# Patient Record
Sex: Female | Born: 1945
Health system: Southern US, Community
[De-identification: ages and names within clinical notes are randomized; demographics above are authoritative.]

## PROBLEM LIST (undated history)

## (undated) DIAGNOSIS — Z9049 Acquired absence of other specified parts of digestive tract: Secondary | ICD-10-CM

## (undated) DIAGNOSIS — R011 Cardiac murmur, unspecified: Secondary | ICD-10-CM

## (undated) DIAGNOSIS — E785 Hyperlipidemia, unspecified: Secondary | ICD-10-CM

## (undated) DIAGNOSIS — N281 Cyst of kidney, acquired: Secondary | ICD-10-CM

## (undated) DIAGNOSIS — I509 Heart failure, unspecified: Secondary | ICD-10-CM

## (undated) DIAGNOSIS — J439 Emphysema, unspecified: Secondary | ICD-10-CM

## (undated) DIAGNOSIS — I10 Essential (primary) hypertension: Secondary | ICD-10-CM

## (undated) DIAGNOSIS — R7401 Elevation of levels of liver transaminase levels: Secondary | ICD-10-CM

## (undated) DIAGNOSIS — M1990 Unspecified osteoarthritis, unspecified site: Secondary | ICD-10-CM

## (undated) DIAGNOSIS — J449 Chronic obstructive pulmonary disease, unspecified: Secondary | ICD-10-CM

## (undated) DIAGNOSIS — R945 Abnormal results of liver function studies: Principal | ICD-10-CM

## (undated) DIAGNOSIS — I219 Acute myocardial infarction, unspecified: Secondary | ICD-10-CM

## (undated) DIAGNOSIS — K219 Gastro-esophageal reflux disease without esophagitis: Secondary | ICD-10-CM

## (undated) DIAGNOSIS — R7989 Other specified abnormal findings of blood chemistry: Secondary | ICD-10-CM

## (undated) DIAGNOSIS — K469 Unspecified abdominal hernia without obstruction or gangrene: Secondary | ICD-10-CM

## (undated) DIAGNOSIS — I2510 Atherosclerotic heart disease of native coronary artery without angina pectoris: Secondary | ICD-10-CM

## (undated) DIAGNOSIS — R740 Nonspecific elevation of levels of transaminase and lactic acid dehydrogenase [LDH]: Secondary | ICD-10-CM

## (undated) DIAGNOSIS — F329 Major depressive disorder, single episode, unspecified: Secondary | ICD-10-CM

## (undated) DIAGNOSIS — F32A Depression, unspecified: Secondary | ICD-10-CM

## (undated) HISTORY — DX: Depression, unspecified: F32.A

## (undated) HISTORY — PX: ABDOMINAL HYSTERECTOMY: SUR658

## (undated) HISTORY — DX: Unspecified osteoarthritis, unspecified site: M19.90

## (undated) HISTORY — DX: Heart failure, unspecified: I50.9

## (undated) HISTORY — DX: Cardiac murmur, unspecified: R01.1

## (undated) HISTORY — DX: Major depressive disorder, single episode, unspecified: F32.9

## (undated) HISTORY — DX: Emphysema, unspecified: J43.9

## (undated) HISTORY — DX: Gastro-esophageal reflux disease without esophagitis: K21.9

## (undated) HISTORY — DX: Unspecified abdominal hernia without obstruction or gangrene: K46.9

## (undated) HISTORY — DX: Acquired absence of other specified parts of digestive tract: Z90.49

## (undated) HISTORY — DX: Abnormal results of liver function studies: R94.5

## (undated) HISTORY — PX: CHOLECYSTECTOMY: SHX55

## (undated) HISTORY — DX: Acute myocardial infarction, unspecified: I21.9

## (undated) HISTORY — DX: Hyperlipidemia, unspecified: E78.5

## (undated) HISTORY — DX: Atherosclerotic heart disease of native coronary artery without angina pectoris: I25.10

## (undated) HISTORY — DX: Other specified abnormal findings of blood chemistry: R79.89

## (undated) HISTORY — DX: Chronic obstructive pulmonary disease, unspecified: J44.9

## (undated) HISTORY — DX: Cyst of kidney, acquired: N28.1

## (undated) HISTORY — DX: Essential (primary) hypertension: I10

---

## 2003-03-20 ENCOUNTER — Ambulatory Visit (HOSPITAL_COMMUNITY): Admission: RE | Admit: 2003-03-20 | Discharge: 2003-03-20 | Payer: Self-pay | Admitting: Internal Medicine

## 2003-03-20 ENCOUNTER — Encounter: Payer: Self-pay | Admitting: Internal Medicine

## 2003-08-09 ENCOUNTER — Emergency Department (HOSPITAL_COMMUNITY): Admission: EM | Admit: 2003-08-09 | Discharge: 2003-08-09 | Payer: Self-pay | Admitting: Emergency Medicine

## 2003-08-10 DIAGNOSIS — I219 Acute myocardial infarction, unspecified: Secondary | ICD-10-CM | POA: Insufficient documentation

## 2003-08-10 HISTORY — PX: BYPASS GRAFT: SHX909

## 2003-08-13 ENCOUNTER — Inpatient Hospital Stay (HOSPITAL_COMMUNITY): Admission: EM | Admit: 2003-08-13 | Discharge: 2003-08-19 | Payer: Self-pay | Admitting: Emergency Medicine

## 2004-02-14 ENCOUNTER — Inpatient Hospital Stay (HOSPITAL_COMMUNITY): Admission: EM | Admit: 2004-02-14 | Discharge: 2004-02-18 | Payer: Self-pay | Admitting: Emergency Medicine

## 2004-03-09 ENCOUNTER — Ambulatory Visit (HOSPITAL_COMMUNITY): Admission: RE | Admit: 2004-03-09 | Discharge: 2004-03-09 | Payer: Self-pay | Admitting: Cardiology

## 2004-04-04 ENCOUNTER — Inpatient Hospital Stay (HOSPITAL_COMMUNITY): Admission: EM | Admit: 2004-04-04 | Discharge: 2004-04-07 | Payer: Self-pay | Admitting: Emergency Medicine

## 2004-06-11 ENCOUNTER — Ambulatory Visit: Payer: Self-pay | Admitting: Internal Medicine

## 2004-07-07 ENCOUNTER — Ambulatory Visit: Payer: Self-pay | Admitting: Cardiology

## 2004-08-17 ENCOUNTER — Inpatient Hospital Stay (HOSPITAL_COMMUNITY): Admission: EM | Admit: 2004-08-17 | Discharge: 2004-08-19 | Payer: Self-pay | Admitting: *Deleted

## 2004-08-18 ENCOUNTER — Ambulatory Visit: Payer: Self-pay | Admitting: Cardiology

## 2004-10-05 ENCOUNTER — Ambulatory Visit: Payer: Self-pay | Admitting: Cardiology

## 2005-01-14 ENCOUNTER — Ambulatory Visit: Payer: Self-pay | Admitting: Physician Assistant

## 2005-01-18 ENCOUNTER — Ambulatory Visit: Payer: Self-pay

## 2005-01-28 ENCOUNTER — Ambulatory Visit: Payer: Self-pay | Admitting: Cardiology

## 2005-08-18 ENCOUNTER — Ambulatory Visit: Payer: Self-pay | Admitting: Cardiology

## 2005-09-21 ENCOUNTER — Emergency Department (HOSPITAL_COMMUNITY): Admission: EM | Admit: 2005-09-21 | Discharge: 2005-09-22 | Payer: Self-pay | Admitting: Emergency Medicine

## 2006-01-26 ENCOUNTER — Ambulatory Visit: Payer: Self-pay | Admitting: Gastroenterology

## 2006-02-10 ENCOUNTER — Ambulatory Visit: Payer: Self-pay | Admitting: Gastroenterology

## 2006-02-27 ENCOUNTER — Emergency Department (HOSPITAL_COMMUNITY): Admission: EM | Admit: 2006-02-27 | Discharge: 2006-02-27 | Payer: Self-pay | Admitting: Emergency Medicine

## 2006-10-04 ENCOUNTER — Ambulatory Visit: Payer: Self-pay | Admitting: Cardiology

## 2006-10-08 ENCOUNTER — Inpatient Hospital Stay (HOSPITAL_COMMUNITY): Admission: EM | Admit: 2006-10-08 | Discharge: 2006-10-09 | Payer: Self-pay | Admitting: Emergency Medicine

## 2006-10-08 ENCOUNTER — Ambulatory Visit: Payer: Self-pay | Admitting: *Deleted

## 2006-10-11 ENCOUNTER — Ambulatory Visit: Payer: Self-pay

## 2006-11-02 ENCOUNTER — Ambulatory Visit: Payer: Self-pay | Admitting: Cardiology

## 2006-11-02 LAB — CONVERTED CEMR LAB
ALT: 26 units/L (ref 0–40)
AST: 20 units/L (ref 0–37)
Albumin: 4.3 g/dL (ref 3.5–5.2)
VLDL: 32 mg/dL (ref 0–40)

## 2007-04-17 ENCOUNTER — Ambulatory Visit: Payer: Self-pay | Admitting: Cardiology

## 2007-07-13 ENCOUNTER — Ambulatory Visit: Payer: Self-pay | Admitting: Cardiology

## 2007-07-16 ENCOUNTER — Emergency Department (HOSPITAL_COMMUNITY): Admission: EM | Admit: 2007-07-16 | Discharge: 2007-07-16 | Payer: Self-pay | Admitting: Emergency Medicine

## 2007-07-18 ENCOUNTER — Emergency Department (HOSPITAL_COMMUNITY): Admission: EM | Admit: 2007-07-18 | Discharge: 2007-07-18 | Payer: Self-pay | Admitting: Emergency Medicine

## 2007-08-24 ENCOUNTER — Encounter: Admission: RE | Admit: 2007-08-24 | Discharge: 2007-08-24 | Payer: Self-pay | Admitting: Internal Medicine

## 2007-09-04 ENCOUNTER — Ambulatory Visit: Payer: Self-pay | Admitting: Cardiology

## 2007-09-04 LAB — CONVERTED CEMR LAB
Albumin: 4.4 g/dL (ref 3.5–5.2)
Alkaline Phosphatase: 58 units/L (ref 39–117)
Total CHOL/HDL Ratio: 4.5
Triglycerides: 371 mg/dL (ref 0–149)

## 2007-09-08 ENCOUNTER — Encounter: Admission: RE | Admit: 2007-09-08 | Discharge: 2007-09-08 | Payer: Self-pay | Admitting: Internal Medicine

## 2007-10-31 ENCOUNTER — Ambulatory Visit: Payer: Self-pay | Admitting: Cardiology

## 2007-10-31 LAB — CONVERTED CEMR LAB
Albumin: 4.1 g/dL (ref 3.5–5.2)
Alkaline Phosphatase: 56 units/L (ref 39–117)
Cholesterol: 176 mg/dL (ref 0–200)
LDL Cholesterol: 104 mg/dL — ABNORMAL HIGH (ref 0–99)
Total Protein: 6.6 g/dL (ref 6.0–8.3)
Triglycerides: 187 mg/dL — ABNORMAL HIGH (ref 0–149)
VLDL: 37 mg/dL (ref 0–40)

## 2007-11-13 ENCOUNTER — Ambulatory Visit: Payer: Self-pay | Admitting: Cardiovascular Disease

## 2008-02-11 ENCOUNTER — Emergency Department (HOSPITAL_COMMUNITY): Admission: EM | Admit: 2008-02-11 | Discharge: 2008-02-11 | Payer: Self-pay | Admitting: Family Medicine

## 2008-05-09 ENCOUNTER — Ambulatory Visit: Payer: Self-pay | Admitting: Cardiology

## 2008-05-09 LAB — CONVERTED CEMR LAB
ALT: 35 units/L (ref 0–35)
AST: 30 units/L (ref 0–37)
Bilirubin, Direct: 0.1 mg/dL (ref 0.0–0.3)
CO2: 31 meq/L (ref 19–32)
Calcium: 9.4 mg/dL (ref 8.4–10.5)
Chloride: 103 meq/L (ref 96–112)
Glucose, Bld: 108 mg/dL — ABNORMAL HIGH (ref 70–99)
HDL: 33.8 mg/dL — ABNORMAL LOW (ref >39.0)
Sodium: 142 meq/L (ref 135–145)
Total Protein: 6.9 g/dL (ref 6.0–8.3)
Triglycerides: 391 mg/dL (ref 0–149)

## 2008-06-06 ENCOUNTER — Ambulatory Visit (HOSPITAL_COMMUNITY): Admission: RE | Admit: 2008-06-06 | Discharge: 2008-06-06 | Payer: Self-pay | Admitting: Internal Medicine

## 2008-07-17 ENCOUNTER — Encounter: Admission: RE | Admit: 2008-07-17 | Discharge: 2008-07-17 | Payer: Self-pay | Admitting: Obstetrics & Gynecology

## 2008-09-02 ENCOUNTER — Telehealth: Payer: Self-pay | Admitting: Gastroenterology

## 2008-09-03 ENCOUNTER — Encounter: Payer: Self-pay | Admitting: Physician Assistant

## 2008-09-03 ENCOUNTER — Ambulatory Visit: Payer: Self-pay | Admitting: Internal Medicine

## 2008-09-03 ENCOUNTER — Telehealth: Payer: Self-pay | Admitting: Physician Assistant

## 2008-09-03 ENCOUNTER — Encounter: Payer: Self-pay | Admitting: Gastroenterology

## 2008-09-03 DIAGNOSIS — R141 Gas pain: Secondary | ICD-10-CM | POA: Insufficient documentation

## 2008-09-03 DIAGNOSIS — R7309 Other abnormal glucose: Secondary | ICD-10-CM

## 2008-09-03 DIAGNOSIS — K219 Gastro-esophageal reflux disease without esophagitis: Secondary | ICD-10-CM | POA: Insufficient documentation

## 2008-09-03 DIAGNOSIS — R142 Eructation: Secondary | ICD-10-CM

## 2008-09-03 DIAGNOSIS — R1319 Other dysphagia: Secondary | ICD-10-CM

## 2008-09-03 DIAGNOSIS — R143 Flatulence: Secondary | ICD-10-CM

## 2008-09-04 ENCOUNTER — Telehealth: Payer: Self-pay | Admitting: Physician Assistant

## 2008-09-10 ENCOUNTER — Ambulatory Visit: Payer: Self-pay | Admitting: Gastroenterology

## 2008-09-11 ENCOUNTER — Telehealth: Payer: Self-pay | Admitting: Gastroenterology

## 2008-10-21 ENCOUNTER — Ambulatory Visit: Payer: Self-pay | Admitting: Gastroenterology

## 2009-01-25 DIAGNOSIS — E785 Hyperlipidemia, unspecified: Secondary | ICD-10-CM | POA: Insufficient documentation

## 2009-01-25 DIAGNOSIS — M549 Dorsalgia, unspecified: Secondary | ICD-10-CM | POA: Insufficient documentation

## 2009-01-25 DIAGNOSIS — R079 Chest pain, unspecified: Secondary | ICD-10-CM | POA: Insufficient documentation

## 2009-01-25 DIAGNOSIS — I2510 Atherosclerotic heart disease of native coronary artery without angina pectoris: Secondary | ICD-10-CM

## 2009-01-25 DIAGNOSIS — R609 Edema, unspecified: Secondary | ICD-10-CM

## 2009-01-25 DIAGNOSIS — F329 Major depressive disorder, single episode, unspecified: Secondary | ICD-10-CM

## 2009-01-25 DIAGNOSIS — I10 Essential (primary) hypertension: Secondary | ICD-10-CM

## 2009-02-04 ENCOUNTER — Ambulatory Visit: Payer: Self-pay | Admitting: Cardiology

## 2009-02-11 ENCOUNTER — Inpatient Hospital Stay (HOSPITAL_COMMUNITY): Admission: EM | Admit: 2009-02-11 | Discharge: 2009-02-13 | Payer: Self-pay | Admitting: Emergency Medicine

## 2009-02-11 ENCOUNTER — Ambulatory Visit: Payer: Self-pay | Admitting: Cardiovascular Disease

## 2009-04-10 ENCOUNTER — Ambulatory Visit: Payer: Self-pay | Admitting: Cardiology

## 2009-04-15 ENCOUNTER — Telehealth: Payer: Self-pay | Admitting: Cardiology

## 2009-04-22 ENCOUNTER — Encounter: Payer: Self-pay | Admitting: Cardiology

## 2009-05-06 ENCOUNTER — Telehealth: Payer: Self-pay | Admitting: Cardiology

## 2009-06-02 ENCOUNTER — Encounter: Payer: Self-pay | Admitting: Cardiology

## 2009-06-30 ENCOUNTER — Encounter: Payer: Self-pay | Admitting: Cardiology

## 2009-07-08 ENCOUNTER — Encounter (INDEPENDENT_AMBULATORY_CARE_PROVIDER_SITE_OTHER): Payer: Self-pay | Admitting: *Deleted

## 2009-07-19 ENCOUNTER — Emergency Department (HOSPITAL_COMMUNITY): Admission: EM | Admit: 2009-07-19 | Discharge: 2009-07-19 | Payer: Self-pay | Admitting: Emergency Medicine

## 2009-07-23 ENCOUNTER — Encounter: Payer: Self-pay | Admitting: Cardiology

## 2009-07-24 ENCOUNTER — Ambulatory Visit (HOSPITAL_COMMUNITY): Admission: RE | Admit: 2009-07-24 | Discharge: 2009-07-24 | Payer: Self-pay | Admitting: Internal Medicine

## 2009-08-05 ENCOUNTER — Inpatient Hospital Stay (HOSPITAL_COMMUNITY): Admission: EM | Admit: 2009-08-05 | Discharge: 2009-08-08 | Payer: Self-pay | Admitting: Emergency Medicine

## 2009-08-05 ENCOUNTER — Encounter (INDEPENDENT_AMBULATORY_CARE_PROVIDER_SITE_OTHER): Payer: Self-pay | Admitting: *Deleted

## 2009-08-05 ENCOUNTER — Ambulatory Visit: Payer: Self-pay | Admitting: Internal Medicine

## 2009-08-06 ENCOUNTER — Encounter (INDEPENDENT_AMBULATORY_CARE_PROVIDER_SITE_OTHER): Payer: Self-pay

## 2009-09-01 ENCOUNTER — Ambulatory Visit: Payer: Self-pay | Admitting: Cardiology

## 2009-09-01 LAB — CONVERTED CEMR LAB
Albumin: 4.4 g/dL (ref 3.5–5.2)
Bilirubin, Direct: 0.2 mg/dL (ref 0.0–0.3)
Total Protein: 7.8 g/dL (ref 6.0–8.3)

## 2009-10-06 ENCOUNTER — Encounter: Payer: Self-pay | Admitting: Cardiology

## 2009-10-27 ENCOUNTER — Encounter (INDEPENDENT_AMBULATORY_CARE_PROVIDER_SITE_OTHER): Payer: Self-pay | Admitting: *Deleted

## 2009-10-27 ENCOUNTER — Encounter: Admission: RE | Admit: 2009-10-27 | Discharge: 2009-10-27 | Payer: Self-pay | Admitting: Internal Medicine

## 2009-11-26 ENCOUNTER — Encounter: Payer: Self-pay | Admitting: Gastroenterology

## 2010-01-09 DIAGNOSIS — K7689 Other specified diseases of liver: Secondary | ICD-10-CM

## 2010-01-13 ENCOUNTER — Ambulatory Visit: Payer: Self-pay | Admitting: Gastroenterology

## 2010-01-13 ENCOUNTER — Encounter (INDEPENDENT_AMBULATORY_CARE_PROVIDER_SITE_OTHER): Payer: Self-pay | Admitting: *Deleted

## 2010-01-13 DIAGNOSIS — K3189 Other diseases of stomach and duodenum: Secondary | ICD-10-CM | POA: Insufficient documentation

## 2010-01-13 DIAGNOSIS — R1013 Epigastric pain: Secondary | ICD-10-CM

## 2010-01-16 ENCOUNTER — Telehealth: Payer: Self-pay | Admitting: Gastroenterology

## 2010-02-12 ENCOUNTER — Ambulatory Visit: Payer: Self-pay | Admitting: Gastroenterology

## 2010-03-03 ENCOUNTER — Telehealth: Payer: Self-pay | Admitting: Gastroenterology

## 2010-05-12 ENCOUNTER — Telehealth: Payer: Self-pay | Admitting: Cardiology

## 2010-05-13 ENCOUNTER — Encounter: Payer: Self-pay | Admitting: Physician Assistant

## 2010-05-13 ENCOUNTER — Ambulatory Visit: Payer: Self-pay | Admitting: Cardiovascular Disease

## 2010-05-13 ENCOUNTER — Encounter: Payer: Self-pay | Admitting: Cardiology

## 2010-05-13 ENCOUNTER — Inpatient Hospital Stay (HOSPITAL_COMMUNITY): Admission: AD | Admit: 2010-05-13 | Discharge: 2010-05-15 | Payer: Self-pay | Admitting: Cardiovascular Disease

## 2010-05-19 ENCOUNTER — Inpatient Hospital Stay (HOSPITAL_COMMUNITY): Admission: EM | Admit: 2010-05-19 | Discharge: 2010-05-20 | Payer: Self-pay | Admitting: Emergency Medicine

## 2010-05-19 ENCOUNTER — Telehealth: Payer: Self-pay | Admitting: Cardiology

## 2010-05-19 ENCOUNTER — Ambulatory Visit: Payer: Self-pay | Admitting: Cardiovascular Disease

## 2010-05-19 ENCOUNTER — Encounter (INDEPENDENT_AMBULATORY_CARE_PROVIDER_SITE_OTHER): Payer: Self-pay | Admitting: *Deleted

## 2010-06-01 ENCOUNTER — Ambulatory Visit: Payer: Self-pay | Admitting: Cardiology

## 2010-06-12 ENCOUNTER — Telehealth: Payer: Self-pay | Admitting: Cardiology

## 2010-06-16 ENCOUNTER — Encounter: Payer: Self-pay | Admitting: Cardiology

## 2010-07-14 ENCOUNTER — Telehealth: Payer: Self-pay | Admitting: Gastroenterology

## 2010-08-17 ENCOUNTER — Ambulatory Visit: Admit: 2010-08-17 | Payer: Self-pay | Admitting: Cardiology

## 2010-08-30 ENCOUNTER — Encounter: Payer: Self-pay | Admitting: Internal Medicine

## 2010-09-08 NOTE — Progress Notes (Signed)
Summary: Condition Update   Phone Note Call from Patient Call back at Work Phone [phone]   Caller: Patient Call For: Dr. Arlyce Dice Summary of Call: Update on Med: Since starting the Hyomax her stomach is feeling better but still hurting in the liver area and back Initial call taken by: Karna Christmas,  January 16, 2010 4:18 PM  Follow-up for Phone Call        No answer, I will try back later. Laureen Ochs LPN  January 19, 2010 9:40 AM  No answer, I will try back later. Laureen Ochs LPN  January 19, 2010 2:14 PM  No answer, I will try back tomorrow. Laureen Ochs LPN  January 19, 2010 3:49 PM  No answer, I will try back this afternoon. Laureen Ochs LPN  January 20, 2010 8:38 AM  Pt. not at work until Thursday, No answer at home #. Will wait for pt. to return call. Laureen Ochs LPN  January 20, 2010 3:17 PM

## 2010-09-08 NOTE — Procedures (Signed)
Summary: Colonoscopy  Patient: [name] Note: All result statuses are Final unless otherwise noted.  Tests: (1) Colonoscopy (COL)   COL Colonoscopy           DONE     [facility]     [address]     [place], [place]  [address]           COLONOSCOPY PROCEDURE REPORT           PATIENT:  Whitney, [name]  MR#:  [id number]     BIRTHDATE:  Feb 11, 1946, 63 yrs. old  GENDER:  female           ENDOSCOPIST:  Barbette Hair. Arlyce Dice, MD     Referred by:           PROCEDURE DATE:  02/12/2010     PROCEDURE:  Diagnostic Colonoscopy     ASA CLASS:  Class II     INDICATIONS:  1) Routine Risk Screening           MEDICATIONS:   Fentanyl 75 mcg IV, Versed 5 mg IV           DESCRIPTION OF PROCEDURE:   After the risks benefits and     alternatives of the procedure were thoroughly explained, informed     consent was obtained.  Digital rectal exam was performed and     revealed no abnormalities.   The LB CF-[id number] endoscope     was introduced through the anus and advanced to the cecum, which     was identified by both the appendix and ileocecal valve, without     limitations.  The quality of the prep was excellent, using     MoviPrep.  The instrument was then slowly withdrawn as the colon     was fully examined.     <<PROCEDUREIMAGES>>           FINDINGS:  A normal appearing cecum, ileocecal valve, and     appendiceal orifice were identified. The ascending, hepatic     flexure, transverse, splenic flexure, descending, sigmoid colon,     and rectum appeared unremarkable (see image2, image3, image4,     image6, image7, image9, image10, image11, image13, image14,     image17, and image18).   Retroflexed views in the rectum revealed     no abnormalities.    The time to cecum =  4.75  minutes. The scope     was then withdrawn (time =  7.25  min) from the patient and the     procedure completed.           COMPLICATIONS:  None           ENDOSCOPIC IMPRESSION:     1) Normal colon  RECOMMENDATIONS:     1) Continue current colorectal screening recommendations for     "routine risk" patients with a repeat colonoscopy in 10 years.           REPEAT EXAM:  In 10 year(s) for Colonoscopy.           ______________________________     Barbette Hair. Arlyce Dice, MD           CC: Marisue Brooklyn, DO           n.     eSIGNED:   Barbette Hair. Kaplan at 02/12/2010 12:23 PM           Ferd Hibbs, [id number]  Note: An exclamation mark (!) indicates a result that was not  dispersed into the flowsheet. Document Creation Date: 02/12/2010 12:25 PM _______________________________________________________________________  (1) Order result status: Final Collection or observation date-time: 02/12/2010 12:21 Requested date-time:  Receipt date-time:  Reported date-time:  Referring Physician:   Ordering Physician: Melvia Heaps [id number]) Specimen Source:  Source: Launa Grill Order Number: [id number] Lab site:   Appended Document: Colonoscopy    Clinical Lists Changes  Observations: Added new observation of COLONNXTDUE: 02/2020 (02/12/2010 14:05)

## 2010-09-08 NOTE — Progress Notes (Signed)
Summary: triage   Phone Note Call from Patient Call back at Home Phone [phone]   Caller: Patient Call For: Dr. Arlyce Dice Reason for Call: Talk to Nurse Summary of Call: Pt is having pain in her liver. has a fatty liver anyways, she is in a lot pain and wants to be seen Initial call taken by: [place] Johnson,  July 14, 2010 3:31 PM  Follow-up for Phone Call        Patient c/o pain under her breast where her ribs are on her right side, states it goea around the side to her back. States that she hurts all the time, it is not better or worse after she eats. Patient states she has a fatty liver. Please advise. Selinda Michaels RN  July 14, 2010 4:15 PM  Additional Follow-up for Phone Call Additional follow up Details #1::        This sounds like musculoskeletal pain.  Instruct her to use local heat.  She may try Advil 600 mg every 6 hours.  If not improved by early next week she should have an office visit Additional Follow-up by: Louis Meckel MD,  July 14, 2010 4:42 PM    Additional Follow-up for Phone Call Additional follow up Details #2::    Informed patient of Dr. Marzetta Board recommendations. Patient aware to call back if not better.  Follow-up by: Selinda Michaels RN,  July 14, 2010 4:58 PM

## 2010-09-08 NOTE — Letter (Signed)
Summary: [facility] Instructions  [facility]  [address] [place], [place] [address]   Phone: [phone]  Fax: [phone]       [name]    1945-08-25    MRN: [id number]        Procedure Day /Date: 02/12/2010 Thursday     Arrival Time: 10:30am     Procedure Time: 11:30am     Location of Procedure:                     [facility] (4th Floor)   PREPARATION FOR COLONOSCOPY WITH MOVIPREP   Starting 5 days prior to your procedure 02/07/2010  do not eat nuts, seeds, popcorn, corn, beans, peas,  salads, or any raw vegetables.  Do not take any fiber supplements (e.g. Metamucil, Citrucel, and Benefiber).  THE DAY BEFORE YOUR PROCEDURE         DATE: 02/11/2010  DAY: Wednesday    1.  Drink clear liquids the entire day-NO SOLID FOOD  2.  Do not drink anything colored red or purple.  Avoid juices with pulp.  No orange juice.  3.  Drink at least 64 oz. (8 glasses) of fluid/clear liquids during the day to prevent dehydration and help the prep work efficiently.  CLEAR LIQUIDS INCLUDE: Water Jello Ice Popsicles Tea (sugar ok, no milk/cream) Powdered fruit flavored drinks Coffee (sugar ok, no milk/cream) Gatorade Juice: apple, white grape, white cranberry  Lemonade Clear bullion, consomm, broth Carbonated beverages (any kind) Strained chicken noodle soup Hard Candy                             4.  In the morning, mix first dose of MoviPrep solution:    Empty 1 Pouch A and 1 Pouch B into the disposable container    Add lukewarm drinking water to the top line of the container. Mix to dissolve    Refrigerate (mixed solution should be used within 24 hrs)  5.  Begin drinking the prep at 5:00 p.m. The MoviPrep container is divided by 4 marks.   Every 15 minutes drink the solution down to the next mark (approximately 8 oz) until the full liter is complete.   6.  Follow completed prep with 16 oz of clear liquid of your choice (Nothing red or purple).   Continue to drink clear liquids until bedtime.  7.  Before going to bed, mix second dose of MoviPrep solution:    Empty 1 Pouch A and 1 Pouch B into the disposable container    Add lukewarm drinking water to the top line of the container. Mix to dissolve    Refrigerate  THE DAY OF YOUR PROCEDURE      DATE:  02/12/2010  DAY: Thursday  Beginning at  6:30am  (5 hours before procedure):         1. Every 15 minutes, drink the solution down to the next mark (approx 8 oz) until the full liter is complete.  2. Follow completed prep with 16 oz. of clear liquid of your choice.    3. You may drink clear liquids until  9:30am (2 HOURS BEFORE PROCEDURE).   MEDICATION INSTRUCTIONS  Unless otherwise instructed, you should take regular prescription medications with a small sip of water   as early as possible the morning of your procedure.          OTHER INSTRUCTIONS  You will need a  responsible adult at least 66 years of age to accompany you and drive you home.   This person must remain in the waiting room during your procedure.  Wear loose fitting clothing that is easily removed.  Leave jewelry and other valuables at home.  However, you may wish to bring a book to read or  an iPod/MP3 player to listen to music as you wait for your procedure to start.  Remove all body piercing jewelry and leave at home.  Total time from sign-in until discharge is approximately 2-3 hours.  You should go home directly after your procedure and rest.  You can resume normal activities the  day after your procedure.  The day of your procedure you should not:   Drive   Make legal decisions   Operate machinery   Drink alcohol   Return to work  You will receive specific instructions about eating, activities and medications before you leave.    The above instructions have been reviewed and explained to me by   _______________________    I fully understand and can verbalize these instructions  _____________________________ Date _________

## 2010-09-08 NOTE — Progress Notes (Signed)
Summary: refill   Phone Note Call from Patient Call back at Home Phone [phone]   Caller: Other Relative Call For: Arlyce Dice Summary of Call: Paitent needs med send to pharmacy [facility] [phone] for 30 dyas supply twice a day. (Hyoscimine) Initial call taken by: Tawni Levy,  March 03, 2010 12:03 PM  Follow-up for Phone Call        Tried to contact pt to inform her that med was sent electronically today. Line was busy.  Follow-up by: Merri Ray CMA (AAMA),  March 03, 2010 1:05 PM    Prescriptions: HYOMAX-SR 0.375 MG XR12H-TAB (HYOSCYAMINE SULFATE) take one tab b.i.d.  #60 x 3   Entered by:   Merri Ray CMA (AAMA)   Authorized by:   Louis Meckel MD   Signed by:   Merri Ray CMA (AAMA) on 03/03/2010   Method used:   Electronically to        [facility] (retail)       [address]       [place], [place]  [address]       Ph: [phone]       Fax: [phone]   RxID:   [id number]

## 2010-09-08 NOTE — Letter (Signed)
Summary: ER Notification  [facility], Main Office  [address]   [place], [place] [address]   Phone: [phone]  Fax: [phone]    May 19, 2010 4:18 PM  [facility]  The above referenced patient has been advised to report directly to the Emergency Room. Please see below for more information:  Dx: RECURRENT CHEST PAIN AFTER RECENT STENTING     Private Vehicle  __XXX_____________ or EMS:  ________________   Orders:  Yes ______ or No  ___XX____   Notify upon arrival:     Trish [phone]       Or _________________   Thank you,    [facility] Staff

## 2010-09-08 NOTE — Progress Notes (Signed)
Summary: rx refill   Phone Note Refill Request Message from:  Patient on June 12, 2010 9:16 AM  Refills Requested: Medication #1:  PLAVIX 75 MG TABS Take one tablet by mouth daily.  Medication #2:  NITROSTAT 0.4 MG SUBL as needed [facility]# [phone]   Method Requested: Telephone to Pharmacy Initial call taken by: Roe Coombs,  June 12, 2010 9:16 AM    Prescriptions: PLAVIX 75 MG TABS (CLOPIDOGREL BISULFATE) Take one tablet by mouth daily  #30 x 12   Entered by:   Kem Parkinson   Authorized by:   Ferman Hamming, MD, [facility]   Signed by:   Kem Parkinson on 06/12/2010   Method used:   Electronically to        [facility] (retail)       [address]       [place], [place]  [address]       Ph: [phone]       Fax: [phone]   RxID:   [id number] NITROSTAT 0.4 MG SUBL (NITROGLYCERIN) as needed  #25 x 12   Entered by:   Kem Parkinson   Authorized by:   Ferman Hamming, MD, [facility]   Signed by:   Kem Parkinson on 06/12/2010   Method used:   Electronically to        [facility] (retail)       [address]       [place], [place]  [address]       Ph: [phone]       Fax: [phone]   RxID:   [id number]

## 2010-09-08 NOTE — Letter (Signed)
Summary: [place] Leaving Against Medical Advice Form   [place] Leaving Against Medical Advice Form   Imported By: Roderic Ovens 05/29/2010 11:56:32  _____________________________________________________________________  External Attachment:    Type:   Image     Comment:   External Document

## 2010-09-08 NOTE — Assessment & Plan Note (Signed)
Summary: F9M/DM  Medications Added ASPIRIN 81 MG TBEC (ASPIRIN) Take one tablet by mouth daily PLAVIX 75 MG TABS (CLOPIDOGREL BISULFATE) Take one tablet by mouth daily        Primary Provider:  Marisue Brooklyn, MD   History of Present Illness: [name] is a pleasant female who has a history of coronary artery disease status post coronary bypassing graft in 2005. She has had prior PCI of the saphenous vein graft to the right coronary artery. Recently admitted in early October of 2011 with recurrent chest pain. She was found to have severe three-vessel coronary artery disease. Her grafts were patent but there was a lesion in the saphenous vein graft to the PDA and she had a drug-eluting stent at that time. She was readmitted with recurrent atypical chest pain and ruled out. We discussed repeat catheterization versus medical therapy and she elected the latter. She was discharged She Denies Any Dyspnea, Chest Pain, Palpitations or Syncope and There Is No Pedal Edema.   Current Medications (verified): 1)  Atenolol 50 Mg Tabs (Atenolol) .Marland Kitchen.. 1 1/2 Tab By Mouth Once Daily 2)  Furosemide 40 Mg Tabs (Furosemide) .Marland Kitchen.. 1 Tab in The Am 3)  Xanax 0.5 Mg Tabs (Alprazolam) .... As Needed 4)  Benicar 20 Mg Tabs (Olmesartan Medoxomil) .... Take One Tablet By Mouth Daily 5)  Clonidine Hcl 0.2 Mg Tabs (Clonidine Hcl) .... Take One Tablet By Mouth Twice A Day 6)  Fish Oil 1000 Mg Caps (Omega-3 Fatty Acids) .... Take 2 Capsules By Mouth Two Times A Day 7)  Hyomax-Sr 0.375 Mg Xr12h-Tab (Hyoscyamine Sulfate) .... Take One Tab B.i.d. 8)  Lopid 600 Mg Tabs (Gemfibrozil) .... Two Times A Day 9)  Milk Thistle 140 Mg Caps (Milk Thistle) .... Dose Unknown, Two Times A Day 10)  Prilosec Otc 20 Mg Tbec (Omeprazole Magnesium) .... Two Times A Day 11)  Nitrostat 0.4 Mg Subl (Nitroglycerin) .... As Needed 12)  Aspirin 81 Mg Tbec (Aspirin) .... Take One Tablet By Mouth Daily 13)  Plavix 75 Mg Tabs (Clopidogrel Bisulfate)  .... Take One Tablet By Mouth Daily  Allergies: No Known Drug Allergies  Past History:  Past Medical History: HYPERLIPIDEMIA (ICD-272.4) HYPERTENSION (ICD-401.9) CAD (ICD-414.00) Hx of MI (ICD-410.90) GERD (ICD-530.81) DIABETES MELLITUS, BORDERLINE (ICD-790.29) GERD (ICD-530.81) DEPRESSION (ICD-311)  Past Surgical History: Quadruple  bypass 1/05   hysterectomy  Cholecystectomy  Social History: Reviewed history from 09/03/2008 and no changes required. 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  Alcohol Use - no Daily Caffeine Use- daily 1 cup of coffee Illicit Drug Use - no Patient gets regular exercise.- walks for excercise  Review of Systems       no fevers or chills, productive cough, hemoptysis, dysphasia, odynophagia, melena, hematochezia, dysuria, hematuria, rash, seizure activity, orthopnea, PND, pedal edema, claudication. Remaining systems are negative.   Vital Signs:  Patient profile:   65 year old female Height:      64 inches Weight:      163 pounds BMI:     28.08 Pulse rate:   50 / minute Resp:     12 per minute BP sitting:   120 / 70  (left arm)  Vitals Entered By: Kem Parkinson (June 01, 2010 8:37 AM)  Physical Exam  General:  Well-developed well-nourished in no acute distress.  Skin is warm and dry.  HEENT is normal.  Neck is supple. No thyromegaly.  Chest is clear to auscultation with normal expansion.  Cardiovascular exam is regular  rate and rhythm.  Abdominal exam nontender or distended. No masses palpated. Left groin showed no hematoma and no bruit. Extremities show no edema. neuro grossly intact    Impression & Recommendations:  Problem # 1:  CAD (ICD-414.00)  Continue aspirin, Plavix, beta blocker. Patient not on a statin at present given history of elevated liver functions. Her updated medication list for this problem includes:    Atenolol 50 Mg Tabs (Atenolol) .Marland Kitchen... 1 1/2 tab by mouth once daily     Nitrostat 0.4 Mg Subl (Nitroglycerin) .Marland Kitchen... As needed  Her updated medication list for this problem includes:    Atenolol 50 Mg Tabs (Atenolol) .Marland Kitchen... 1 1/2 tab by mouth once daily    Nitrostat 0.4 Mg Subl (Nitroglycerin) .Marland Kitchen... As needed    Aspirin 81 Mg Tbec (Aspirin) .Marland Kitchen... Take one tablet by mouth daily    Plavix 75 Mg Tabs (Clopidogrel bisulfate) .Marland Kitchen... Take one tablet by mouth daily  Her updated medication list for this problem includes:    Atenolol 50 Mg Tabs (Atenolol) .Marland Kitchen... 1 1/2 tab by mouth once daily    Nitrostat 0.4 Mg Subl (Nitroglycerin) .Marland Kitchen... As needed    Aspirin 81 Mg Tbec (Aspirin) .Marland Kitchen... Take one tablet by mouth daily    Plavix 75 Mg Tabs (Clopidogrel bisulfate) .Marland Kitchen... Take one tablet by mouth daily  Problem # 2:  HYPERTENSION (ICD-401.9)  Blood pressure controlled on present medications. Will continue. Her updated medication list for this problem includes:    Atenolol 50 Mg Tabs (Atenolol) .Marland Kitchen... 1 1/2 tab by mouth once daily    Furosemide 40 Mg Tabs (Furosemide) .Marland Kitchen... 1 tab in the am    Benicar 20 Mg Tabs (Olmesartan medoxomil) .Marland Kitchen... Take one tablet by mouth daily    Clonidine Hcl 0.2 Mg Tabs (Clonidine hcl) .Marland Kitchen... Take one tablet by mouth twice a day    Aspirin 81 Mg Tbec (Aspirin) .Marland Kitchen... Take one tablet by mouth daily  Her updated medication list for this problem includes:    Atenolol 50 Mg Tabs (Atenolol) .Marland Kitchen... 1 1/2 tab by mouth once daily    Furosemide 40 Mg Tabs (Furosemide) .Marland Kitchen... 1 tab in the am    Benicar 20 Mg Tabs (Olmesartan medoxomil) .Marland Kitchen... Take one tablet by mouth daily    Clonidine Hcl 0.2 Mg Tabs (Clonidine hcl) .Marland Kitchen... Take one tablet by mouth twice a day    Aspirin 81 Mg Tbec (Aspirin) .Marland Kitchen... Take one tablet by mouth daily  Problem # 3:  HYPERLIPIDEMIA (ICD-272.4)  Continue present medications. Lipids and liver monitored by primary care. Her updated medication list for this problem includes:    Lopid 600 Mg Tabs (Gemfibrozil) .Marland Kitchen..Marland Kitchen Two times a day  Her  updated medication list for this problem includes:    Lopid 600 Mg Tabs (Gemfibrozil) .Marland Kitchen..Marland Kitchen Two times a day  Problem # 4:  FATTY LIVER DISEASE (ICD-571.8) Monitored by GI.  Problem # 5:  CHEST PAIN (ICD-786.50)  Difficult to evaluate at times as she has chronic chest pain. No symptoms at present. Her updated medication list for this problem includes:    Atenolol 50 Mg Tabs (Atenolol) .Marland Kitchen... 1 1/2 tab by mouth once daily    Nitrostat 0.4 Mg Subl (Nitroglycerin) .Marland Kitchen... As needed    Aspirin 81 Mg Tbec (Aspirin) .Marland Kitchen... Take one tablet by mouth daily    Plavix 75 Mg Tabs (Clopidogrel bisulfate) .Marland Kitchen... Take one tablet by mouth daily  Her updated medication list for this problem includes:    Atenolol 50  Mg Tabs (Atenolol) .Marland Kitchen... 1 1/2 tab by mouth once daily    Nitrostat 0.4 Mg Subl (Nitroglycerin) .Marland Kitchen... As needed    Aspirin 81 Mg Tbec (Aspirin) .Marland Kitchen... Take one tablet by mouth daily    Plavix 75 Mg Tabs (Clopidogrel bisulfate) .Marland Kitchen... Take one tablet by mouth daily  Problem # 6:  GERD (ICD-530.81)  Her updated medication list for this problem includes:    Hyomax-sr 0.375 Mg Xr12h-tab (Hyoscyamine sulfate) .Marland Kitchen... Take one tab b.i.d.    Prilosec Otc 20 Mg Tbec (Omeprazole magnesium) .Marland Kitchen..Marland Kitchen Two times a day  Her updated medication list for this problem includes:    Hyomax-sr 0.375 Mg Xr12h-tab (Hyoscyamine sulfate) .Marland Kitchen... Take one tab b.i.d.    Prilosec Otc 20 Mg Tbec (Omeprazole magnesium) .Marland Kitchen..Marland Kitchen Two times a day  Patient Instructions: 1)  Your physician recommends that you schedule a follow-up appointment in: 3 MONTHS WITH DR Wilho Sharpley 2)  Your physician recommends that you continue on your current medications as directed. Please refer to the Current Medication list given to you today.

## 2010-09-08 NOTE — Cardiovascular Report (Signed)
Summary: Pre Cath/Percutaneous Orders   Pre Cath/Percutaneous Orders   Imported By: Roderic Ovens 05/20/2010 15:49:29  _____________________________________________________________________  External Attachment:    Type:   Image     Comment:   External Document

## 2010-09-08 NOTE — Assessment & Plan Note (Signed)
Summary: eph per [name]  Medications Added ATENOLOL 50 MG TABS (ATENOLOL) 1 1/2 tab by mouth once daily BENICAR 20 MG TABS (OLMESARTAN MEDOXOMIL) Take one tablet by mouth daily CLONIDINE HCL 0.2 MG TABS (CLONIDINE HCL) Take one tablet by mouth twice a day        Primary Provider:  Marisue [name]   History of Present Illness: [name] is a pleasant female who has a history of coronary artery disease status post coronary bypassing graft in 2005. She has had prior PCI of the saphenous vein graft to the right coronary artery. Last catheterization on July 7 of 2010  revealed normal LV function and all grafts were patent.  Her most recent Myoview was performed on October 11, 2006.  At that time, she had normal perfusion with minimal apical thinning.  Her ejection fraction was 79%. I last saw her in September 2010. Since then she had a cholecystectomy in December of 2010. She Was Discharged She Denies Any Dyspnea, Chest Pain, Palpitations or Syncope and There Is No Pedal Edema.  Current Medications (verified): 1)  Atenolol 50 Mg Tabs (Atenolol) .Marland Kitchen.. 1 1/2 Tab By Mouth Once Daily 2)  Furosemide 40 Mg Tabs (Furosemide) .Marland Kitchen.. 1 Tab Iin The Am 3)  Clonidine Hcl 0.2 Mg Tabs (Clonidine Hcl) .... 1/2 To 1 Tab Two Times A Day 4)  Tylenol Extra Strength 500 Mg Tabs (Acetaminophen) .... Take 1-2 At Bedtime 5)  Paroxetine Hcl 20 Mg Tabs (Paroxetine Hcl) .... Take 2 Tab Daily 6)  Xanax 0.5 Mg Tabs (Alprazolam) .... As Needed 7)  Aspirin 81 Mg Tbec (Aspirin) .... Take One Tablet By Mouth Daily 8)  Benicar 20 Mg Tabs (Olmesartan Medoxomil) .... Take One Tablet By Mouth Daily 9)  Clonidine Hcl 0.2 Mg Tabs (Clonidine Hcl) .... Take One Tablet By Mouth Twice A Day  Allergies: No Known Drug Allergies  Past History:  Past Medical History: Current Problems:  HYPERLIPIDEMIA (ICD-272.4) HYPERTENSION (ICD-401.9) CAD (ICD-414.00) Hx of MI (ICD-410.90) GERD (ICD-530.81) DYSPHAGIA (ICD-787.29) DIABETES  MELLITUS, BORDERLINE (ICD-790.29) GERD (ICD-530.81) DEPRESSION (ICD-311)  Past Surgical History: Reviewed history from 04/10/2009 and no changes required. Quadruple  bypass 1/05   hysterectomy  Stent placement 3/05  RESULTS:  Initially the stenosis in the vein graft to the posterior  descending branch of the right coronary artery was located in the midportion  of the graft right at a valve.  It was estimated to be 80%.  Following  stenting, this improved to 0%.  There was no dissection seen.  CONCLUSION:  Successful stenting of the lesion in the midportion of the vein  graft to the posterior descending branch fo the right coronary artery with  improvement of sentinel narrowing from 80% to 0%.   BB/MEDQ  D:  04/06/2004  T:  04/07/2004  Job:  [id number]  Social History: Reviewed history from 09/03/2008 and no changes required. 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  Alcohol Use - no Daily Caffeine Use- daily 1 cup of coffee Illicit Drug Use - no Patient gets regular exercise.- walks for excercise  Review of Systems       no fevers or chills, productive cough, hemoptysis, dysphasia, odynophagia, melena, hematochezia, dysuria, hematuria, rash, seizure activity, orthopnea, PND, pedal edema, claudication. Remaining systems are negative.   Vital Signs:  Patient profile:   65 year old female Height:      64 inches Weight:      160 pounds BMI:     27.56 Pulse rate:  52 / minute Resp:     12 per minute BP sitting:   121 / 71  (left arm)  Vitals Entered By: [name] (September 01, 2009 8:46 AM)  Physical Exam  General:  Well-developed well-nourished in no acute distress.  Skin is warm and dry.  HEENT is normal.  Neck is supple. No thyromegaly.  Chest is clear to auscultation with normal expansion.  Cardiovascular exam is regular rate and rhythm.  Abdominal exam nontender or distended. No masses palpated. Extremities show no edema. neuro grossly  intact    EKG  Procedure date:  09/01/2009  Findings:      Is Bradycardia Rate of 53. Axis Normal. No ST Changes.  Impression & Recommendations:  Problem # 1:  HYPERLIPIDEMIA (ICD-272.4)  The following medications were removed from the medication list:    Lofibra 134 Mg Caps (Fenofibrate micronized) .Marland Kitchen... Take 1 cap daily  Orders: TLB-Hepatic/Liver Function Pnl (80076-HEPATIC)  Problem # 2:  HYPERTENSION (ICD-401.9)  Blood pressure controlled on present medications. Will continue. The following medications were removed from the medication list:    Enalapril Maleate 20 Mg Tabs (Enalapril maleate) .Marland Kitchen... 1 tab by mouth twice daily Her updated medication list for this problem includes:    Atenolol 50 Mg Tabs (Atenolol) .Marland Kitchen... 1 1/2 tab by mouth once daily    Furosemide 40 Mg Tabs (Furosemide) .Marland Kitchen... 1 tab iin the am    Clonidine Hcl 0.2 Mg Tabs (Clonidine hcl) .Marland Kitchen... 1/2 to 1 tab two times a day    Aspirin 81 Mg Tbec (Aspirin) .Marland Kitchen... Take one tablet by mouth daily    Benicar 20 Mg Tabs (Olmesartan medoxomil) .Marland Kitchen... Take one tablet by mouth daily    Clonidine Hcl 0.2 Mg Tabs (Clonidine hcl) .Marland Kitchen... Take one tablet by mouth twice a day  The following medications were removed from the medication list:    Enalapril Maleate 20 Mg Tabs (Enalapril maleate) .Marland Kitchen... 1 tab by mouth twice daily Her updated medication list for this problem includes:    Atenolol 50 Mg Tabs (Atenolol) .Marland Kitchen... 1 1/2 tab by mouth once daily    Furosemide 40 Mg Tabs (Furosemide) .Marland Kitchen... 1 tab iin the am    Clonidine Hcl 0.2 Mg Tabs (Clonidine hcl) .Marland Kitchen... 1/2 to 1 tab two times a day    Aspirin 81 Mg Tbec (Aspirin) .Marland Kitchen... Take one tablet by mouth daily    Benicar 20 Mg Tabs (Olmesartan medoxomil) .Marland Kitchen... Take one tablet by mouth daily    Clonidine Hcl 0.2 Mg Tabs (Clonidine hcl) .Marland Kitchen... Take one tablet by mouth twice a day  Problem # 3:  CHEST PAIN (ICD-786.50) No recent symptoms. The following medications were removed from the  medication list:    Enalapril Maleate 20 Mg Tabs (Enalapril maleate) .Marland Kitchen... 1 tab by mouth twice daily Her updated medication list for this problem includes:    Atenolol 50 Mg Tabs (Atenolol) .Marland Kitchen... 1 1/2 tab by mouth once daily    Aspirin 81 Mg Tbec (Aspirin) .Marland Kitchen... Take one tablet by mouth daily  The following medications were removed from the medication list:    Enalapril Maleate 20 Mg Tabs (Enalapril maleate) .Marland Kitchen... 1 tab by mouth twice daily Her updated medication list for this problem includes:    Atenolol 50 Mg Tabs (Atenolol) .Marland Kitchen... 1 1/2 tab by mouth once daily    Aspirin 81 Mg Tbec (Aspirin) .Marland Kitchen... Take one tablet by mouth daily  Problem # 4:  CAD (ICD-414.00) Continue aspirin, beta blocker and ACE  inhibitor. Resume statin as outlined above if liver functions have normalized. The following medications were removed from the medication list:    Enalapril Maleate 20 Mg Tabs (Enalapril maleate) .Marland Kitchen... 1 tab by mouth twice daily Her updated medication list for this problem includes:    Atenolol 50 Mg Tabs (Atenolol) .Marland Kitchen... 1 1/2 tab by mouth once daily    Aspirin 81 Mg Tbec (Aspirin) .Marland Kitchen... Take one tablet by mouth daily  The following medications were removed from the medication list:    Enalapril Maleate 20 Mg Tabs (Enalapril maleate) .Marland Kitchen... 1 tab by mouth twice daily Her updated medication list for this problem includes:    Atenolol 50 Mg Tabs (Atenolol) .Marland Kitchen... 1 1/2 tab by mouth once daily    Aspirin 81 Mg Tbec (Aspirin) .Marland Kitchen... Take one tablet by mouth daily  Problem # 5:  GERD (ICD-530.81)  The following medications were removed from the medication list:    Nexium 40 Mg Cpdr (Esomeprazole magnesium) .Marland Kitchen... 1 tab by mouth once daily  Problem # 6:  DIABETES MELLITUS, BORDERLINE (ICD-790.29)  Problem # 7:  DEPRESSION (ICD-311)  Patient Instructions: 1)  Your physician recommends that you schedule a follow-up appointment in: 9 MONTHS

## 2010-09-08 NOTE — Progress Notes (Signed)
Summary: pt has questions   Phone Note Call from Patient   Caller: Patient [phone] Reason for Call: Talk to Nurse Summary of Call: pt had two stents placed last thursday, when walking her chest hurts, wants to know if she should take a nitro? Initial call taken by: Glynda Jaeger,  May 19, 2010 1:23 PM  Follow-up for Phone Call        spoke with pt, she had stents placed last week and she has noticed some episodes of chest pain. they occur at different times, she has not done much exerction. she has rested and the pain go away and the pain is also relieved with NTG.  this is the same type of discomfort she had prior to stenting. pt to go to the er for eval. pt voiced understandinger and cardmaster made aware Deliah Goody, RN  May 19, 2010 4:14 PM

## 2010-09-08 NOTE — Progress Notes (Signed)
Summary: chest pain     Phone Note Call from Patient   Caller: Patient Reason for Call: Talk to Nurse, Talk to Doctor Summary of Call: pt is having chest pain and her back is hurting, she also has a headache Initial call taken by: Omer Jack,  May 12, 2010 1:11 PM  Follow-up for Phone Call        having cp for 2 weeks off and on - chest pain right in the middle of her chest, radiating into back where she did before having open heart surgery.  feels "kind of tired", no n/v, SOB or diaphoresis.  Pt is going to use SL ntg, as she hasn't not tried that yet.  She is instructed to lay down, use the Ntg as ordered and if the pain is not resolved by  the 3rd one, she is to call 911 and go to the hospital.  Pt states understanding.  I will call pt back in 15 minutes to follow up.  Follow-up by: Charolotte Capuchin, RN,  May 12, 2010 1:20 PM  Additional Follow-up for Phone Call Additional follow up Details #1::        Called pt to follow up.  Pt states chest pain is resolved after 2 SL ntg.  She is given an appt for eval 05/13/2010 with the PA at 9:15.  She is aware however if chest pain reoccurs prior to that time, she is to call 911 and report to the ED at [facility]. Additional Follow-up by: Charolotte Capuchin, RN,  May 12, 2010 1:37 PM

## 2010-09-08 NOTE — Assessment & Plan Note (Signed)
Summary: increased chest pain over 2 weeks  Medications Added FUROSEMIDE 40 MG TABS (FUROSEMIDE) 1 tab in the am LOPID 600 MG TABS (GEMFIBROZIL) two times a day MILK THISTLE 140 MG CAPS (MILK THISTLE) dose unknown, two times a day PRILOSEC OTC 20 MG TBEC (OMEPRAZOLE MAGNESIUM) two times a day NITROSTAT 0.4 MG SUBL (NITROGLYCERIN) as needed      Allergies Added: NKDA  Visit Type:  chest pains Primary Provider:  Marisue Brooklyn, MD  CC:  pt stated over the past two weeks has had alot of chest pains and and in her back.  History of Present Illness: This is a 65 year old white female patient who presents today with one and a half month history of recurrent chest pain. She has a history of coronary artery disease status post CABG x4 in January 2005 with a LIMA to the LAD, SVG to the own 2, and SVG to the OM 3 and PDA. 3 months after her CABG she had Taxus stent placed to the SVG to the PDA. Her last cardiac catheterization was February 12, 2009 were all grafts were patent and medical therapy recommended. Her stent was widely patent and Plavix was discontinued.  The patient describes hurting in the center of her chest that radiates to her back. This occurs off and on all day long. It is not worsened with exertion. At first she thought it was her acid reflux but now she states that exactly  the same pain she had prior to bypass surgery. She denies radiation into her neck or arm. She denies dyspnea, dyspnea on exertion, dizziness, or presyncope. When she walks up and down her driveway this does not make it worse. She does get relief from nitroglycerin. She is currently having mild chest pain in our office.  Current Medications (verified): 1)  Atenolol 50 Mg Tabs (Atenolol) .Marland Kitchen.. 1 1/2 Tab By Mouth Once Daily 2)  Furosemide 40 Mg Tabs (Furosemide) .Marland Kitchen.. 1 Tab in The Am 3)  Xanax 0.5 Mg Tabs (Alprazolam) .... As Needed 4)  Benicar 20 Mg Tabs (Olmesartan Medoxomil) .... Take One Tablet By Mouth Daily 5)   Clonidine Hcl 0.2 Mg Tabs (Clonidine Hcl) .... Take One Tablet By Mouth Twice A Day 6)  Fish Oil 1000 Mg Caps (Omega-3 Fatty Acids) .... Take 2 Capsules By Mouth Two Times A Day 7)  Hyomax-Sr 0.375 Mg Xr12h-Tab (Hyoscyamine Sulfate) .... Take One Tab B.i.d. 8)  Lopid 600 Mg Tabs (Gemfibrozil) .... Two Times A Day 9)  Milk Thistle 140 Mg Caps (Milk Thistle) .... Dose Unknown, Two Times A Day 10)  Prilosec Otc 20 Mg Tbec (Omeprazole Magnesium) .... Two Times A Day 11)  Nitrostat 0.4 Mg Subl (Nitroglycerin) .... As Needed  Allergies (verified): No Known Drug Allergies  Past History:  Past Medical History: Last updated: 09/01/2009 Current Problems:  HYPERLIPIDEMIA (ICD-272.4) HYPERTENSION (ICD-401.9) CAD (ICD-414.00) Hx of MI (ICD-410.90) GERD (ICD-530.81) DYSPHAGIA (ICD-787.29) DIABETES MELLITUS, BORDERLINE (ICD-790.29) GERD (ICD-530.81) DEPRESSION (ICD-311)  Past Surgical History: Last updated: 04/10/2009 Quadruple  bypass 1/05   hysterectomy  Stent placement 3/05  RESULTS:  Initially the stenosis in the vein graft to the posterior  descending branch of the right coronary artery was located in the midportion  of the graft right at a valve.  It was estimated to be 80%.  Following  stenting, this improved to 0%.  There was no dissection seen.  CONCLUSION:  Successful stenting of the lesion in the midportion of the vein  graft to the posterior  descending branch fo the right coronary artery with  improvement of sentinel narrowing from 80% to 0%.   BB/MEDQ  D:  04/06/2004  T:  04/07/2004  Job:  [id number]  Family History: Last updated: 01/13/2010 Mother- Stomach Cancer? 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  Alcohol Use - no Daily Caffeine Use- daily 1 cup of coffee Illicit Drug Use - no Patient gets regular exercise.- walks for  excercise  Review of Systems       see history of present illness. The patient also has burning in her chest and her esophagus after she eats certain foods. She does take medication for this. All other 10 point  review of systems is negative  Vital Signs:  Patient profile:   65 year old female Height:      64 inches Weight:      163.25 pounds BMI:     28.12 Pulse rate:   47 / minute BP sitting:   134 / 76  (left arm) Cuff size:   regular  Vitals Entered By: Caralee Ates CMA (May 13, 2010 9:09 AM)  Physical Exam  General:   Well-nournished, in no acute distress. Neck: No JVD, HJR, Bruit, or thyroid enlargement Lungs: No tachypnea, clear without wheezing, rales, or rhonchi Cardiovascular: RRR, PMI not displaced positive S4, 2/6 systolic ejection murmur at the left sternal border,no bruit, thrill, or heave. Abdomen: BS normal. Soft without organomegaly, masses, lesions or tenderness. Extremities: without cyanosis, clubbing or edema. Good distal pulses bilateral SKin: Warm, no lesions or rashes  Musculoskeletal: No deformities Neuro: no focal signs    EKG  Procedure date:  05/13/2010  Findings:      sinus bradycardia at 47 beats per minute with T wave inversion in aVR and V1 old anterior infarct no acute change from EKG September 01, 2009  Impression & Recommendations:  Problem # 1:  CHEST PAIN (ICD-786.50) Patient has one half month history of chest pain off-and-on that is becoming more frequent and responsive to nitroglycerin. She states this is the same pain she had prior to her bypass surgery. She would be admitted to rule out MI and is scheduled for cardiac catheterization possibly today. She has not eaten or had anything to drink today. Risks and benefits of cardiac catheterization have been explained and patient is agreeable. Patient is having mild chest pain in the office. She is signing a release so that her husband can drive her over to the hospital. We recommend  ambulance but she does not want to agree to this. The following medications were removed from the medication list:    Aspirin 81 Mg Tbec (Aspirin) .Marland Kitchen... Take one tablet by mouth daily Her updated medication list for this problem includes:    Atenolol 50 Mg Tabs (Atenolol) .Marland Kitchen... 1 1/2 tab by mouth once daily    Nitrostat 0.4 Mg Subl (Nitroglycerin) .Marland Kitchen... As needed  Problem # 2:  CAD (ICD-414.00) Patient had CABG x4 in January 2005, with the LIMA to the LAD, SVG to the Om 2, SVG to the arm 3 and PDA. 3 months later she had a Taxus stent to the SVG to the PDA. Cardiac catheter in 2010 showed patent grafts and stent. Medical therapy recommended. The following medications were removed from the medication list:    Aspirin 81 Mg Tbec (Aspirin) .Marland Kitchen... Take one tablet by mouth daily Her updated medication list for this  problem includes:    Atenolol 50 Mg Tabs (Atenolol) .Marland Kitchen... 1 1/2 tab by mouth once daily    Nitrostat 0.4 Mg Subl (Nitroglycerin) .Marland Kitchen... As needed  Problem # 3:  HYPERTENSION (ICD-401.9) Blood pressure is stable The following medications were removed from the medication list:    Clonidine Hcl 0.2 Mg Tabs (Clonidine hcl) .Marland Kitchen... 1/2 to 1 tab two times a day    Aspirin 81 Mg Tbec (Aspirin) .Marland Kitchen... Take one tablet by mouth daily Her updated medication list for this problem includes:    Atenolol 50 Mg Tabs (Atenolol) .Marland Kitchen... 1 1/2 tab by mouth once daily    Furosemide 40 Mg Tabs (Furosemide) .Marland Kitchen... 1 tab in the am    Benicar 20 Mg Tabs (Olmesartan medoxomil) .Marland Kitchen... Take one tablet by mouth daily    Clonidine Hcl 0.2 Mg Tabs (Clonidine hcl) .Marland Kitchen... Take one tablet by mouth twice a day  Problem # 4:  TreatedHYPERLIPIDEMIA (ICD-272.4)  Her updated medication list for this problem includes:    Lopid 600 Mg Tabs (Gemfibrozil) .Marland Kitchen..Marland Kitchen Two times a day  Problem # 5:  FATTY LIVER DISEASE (ICD-571.8)  Problem # 6:  DYSPEPSIA&OTHER SPEC DISORDERS FUNCTION STOMACH (ICD-536.8) Patient on Prilosec  Problem  # 7:  DIABETES MELLITUS, BORDERLINE (ICD-790.29)  Patient Instructions: 1)  Admitted to [facility] today. We will rule out an MI and she will undergo cardiac catheterization. 2)  Your physician has requested that you have a cardiac catheterization.  Cardiac catheterization is used to diagnose and/or treat various heart conditions. Doctors may recommend this procedure for a number of different reasons. The most common reason is to evaluate chest pain. Chest pain can be a symptom of coronary artery disease (CAD), and cardiac catheterization can show whether plaque is narrowing or blocking your heart's arteries. This procedure is also used to evaluate the valves, as well as measure the blood flow and oxygen levels in different parts of your heart.  For further information please visit [email or web address]  Please follow instruction sheet, as given.

## 2010-09-08 NOTE — Assessment & Plan Note (Signed)
Summary: upper gastric pain--ch.    History of Present Illness Visit Type: Follow-up Visit Primary GI MD: Melvia Heaps MD [facility] Primary Provider: Marisue Brooklyn, MD Requesting Provider: Marisue Brooklyn, MD Chief Complaint: Patient c/o worsening constant right sided abdominal pain that often radiates to the back. She also c/o occasional bloating and reflux. She also c/o occasional diarrhea and dark black stools.  History of Present Illness:   [name] is a 65 year old white female referred at the request of Dr. Elisabeth Most for evaluation of abdominal discomfort.  She is complaining of fairly constant pain in the right upper and llower quadrants with radiation to between the shoulders.  It is unaffected by eating or moving her bowels.  She underwent a cholecystectomy in December, 2010.  Fatty liver was demonstrated by ultrasound and CT scans.  In January, 2011 total bilirubin was 1.5, AST 47 and ALT 72 alkaline phosphatase was normal.  She reports frequent loose stools postprandially.  She is reluctant to take any medications.  She is also complaining of pyrosis with bloating and some nausea.  She takes Prilosec as needed.   GI Review of Systems    Reports abdominal pain, acid reflux, bloating, heartburn, loss of appetite, and  nausea.     Location of  Abdominal pain: right side.    Denies belching, chest pain, dysphagia with liquids, dysphagia with solids, vomiting, vomiting blood, weight loss, and  weight gain.      Reports black tarry stools, diarrhea, and  liver problems.     Denies anal fissure, change in bowel habit, constipation, diverticulosis, fecal incontinence, heme positive stool, hemorrhoids, irritable bowel syndrome, jaundice, light color stool, rectal bleeding, and  rectal pain.    Current Medications (verified): 1)  Atenolol 50 Mg Tabs (Atenolol) .Marland Kitchen.. 1 1/2 Tab By Mouth Once Daily 2)  Furosemide 40 Mg Tabs (Furosemide) .Marland Kitchen.. 1 Tab Iin The Am 3)  Clonidine Hcl 0.2 Mg Tabs  (Clonidine Hcl) .... 1/2 To 1 Tab Two Times A Day 4)  Xanax 0.5 Mg Tabs (Alprazolam) .... As Needed 5)  Aspirin 81 Mg Tbec (Aspirin) .... Take One Tablet By Mouth Daily 6)  Benicar 20 Mg Tabs (Olmesartan Medoxomil) .... Take One Tablet By Mouth Daily 7)  Clonidine Hcl 0.2 Mg Tabs (Clonidine Hcl) .... Take One Tablet By Mouth Twice A Day 8)  Fish Oil 1000 Mg Caps (Omega-3 Fatty Acids) .... Take 2 Capsules By Mouth Two Times A Day  Allergies (verified): No Known Drug Allergies  Past History:  Past Medical History: Reviewed history from 09/01/2009 and no changes required. Current Problems:  HYPERLIPIDEMIA (ICD-272.4) HYPERTENSION (ICD-401.9) CAD (ICD-414.00) Hx of MI (ICD-410.90) GERD (ICD-530.81) DYSPHAGIA (ICD-787.29) DIABETES MELLITUS, BORDERLINE (ICD-790.29) GERD (ICD-530.81) DEPRESSION (ICD-311)  Past Surgical History: Reviewed history from 04/10/2009 and no changes required. Quadruple  bypass 1/05   hysterectomy  Stent placement 3/05  RESULTS:  Initially the stenosis in the vein graft to the posterior  descending branch of the right coronary artery was located in the midportion  of the graft right at a valve.  It was estimated to be 80%.  Following  stenting, this improved to 0%.  There was no dissection seen.  CONCLUSION:  Successful stenting of the lesion in the midportion of the vein  graft to the posterior descending branch fo the right coronary artery with  improvement of sentinel narrowing from 80% to 0%.   BB/MEDQ  D:  04/06/2004  T:  04/07/2004  Job:  [id number]  Family History: Reviewed  history from 09/03/2008 and no changes required. Mother- Stomach Cancer? Mother Diabetes Mellitus MI's- brothers Sister- Addison's Disease   Social History: Reviewed history from 09/03/2008 and no changes required. 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  Alcohol Use - no Daily Caffeine Use- daily 1 cup of coffee Illicit Drug Use - no Patient gets  regular exercise.- walks for excercise  Review of Systems       The patient complains of arthritis/joint pain, back pain, change in vision, fatigue, headaches-new, sleeping problems, and urination - excessive.  The patient denies allergy/sinus, anemia, anxiety-new, blood in urine, breast changes/lumps, confusion, cough, coughing up blood, depression-new, fainting, fever, hearing problems, heart murmur, heart rhythm changes, itching, menstrual pain, muscle pains/cramps, night sweats, nosebleeds, pregnancy symptoms, shortness of breath, skin rash, sore throat, swelling of feet/legs, swollen lymph glands, thirst - excessive , urination - excessive , urination changes/pain, urine leakage, vision changes, voice change, and thirst - excessive.         All other systems were reviewed and were negative   Vital Signs:  Patient profile:   65 year old female Height:      64 inches Weight:      165 pounds BMI:     28.42 BSA:     1.80 Pulse rate:   64 / minute Pulse rhythm:   regular BP sitting:   116 / 62  (left arm)  Vitals Entered By: Lamona Curl CMA (AAMA) (January 13, 2010 10:12 AM)  Physical Exam  Additional Exam:  on physical exam she is a well-developed alert female  skin: anicteric HEENT: normocephalic; PEERLA; no nasal or pharyngeal abnormalities neck: supple nodes: no cervical lymphadenopathy chest: clear to ausculatation and percussion heart: no murmurs, gallops, or rubs abd: soft, nontender; BS normoactive; no abdominal masses, tenderness, organomegaly rectal: deferred ext: no cynanosis, clubbing, edema skeletal: no deformities neuro: oriented x 3; no focal abnormalities    Impression & Recommendations:  Problem # 1:  DYSPEPSIA&OTHER SPEC DISORDERS FUNCTION STOMACH (ICD-536.8)  Patient's right-sided abdominal discomfort is nonspecific.  She has no gallbladder.  Retained stones are very unlikely in the absence of changes on CT of the biliary system.  She has a history  of GERD symptoms could be causing these symtoms.  Recommendations #1 the patient was instructed to take Prilosec daily #2 trial of hyomax 0.375 mg b.i.d. #3 review prior scans including CT ultrasound  Orders: Colonoscopy (Colon)  Problem # 2:  FATTY LIVER DISEASE (ICD-571.8) The absence of hepatomegaly renders abdominal pain from capsular distention unlikely.  Orders: Colonoscopy (Colon)  Problem # 3:  SPECIAL SCREENING FOR MALIGNANT NEOPLASMS COLON (ICD-V76.51) I again advised patient to undergo screening colonoscopy.  Problem # 4:  CAD (ICD-414.00) Assessment: Comment Only  Patient Instructions: 1)  cc Amy Elisabeth Most, DO 2)  Come to the 4th floor of the [facility] on 02/12/2010 arriving at 10:30am for your Colonoscopy 3)  Your Movi prep has been sent to your pharm.  4)  Colonoscopy and Flexible Sigmoidoscopy brochure given.  5)  The medication list was reviewed and reconciled.  All changed / newly prescribed medications were explained.  A complete medication list was provided to the patient / caregiver. Prescriptions: MOVIPREP 100 GM  SOLR (PEG-KCL-NACL-NASULF-NA ASC-C) As per prep instructions.  #1 x 0   Entered by:   Harlow Mares CMA (AAMA)   Authorized by:   Louis Meckel MD   Signed by:   Harlow Mares CMA (AAMA) on 01/13/2010  Method used:   Electronically to        [facility] (retail)       [address]       [place], [place]  [address]       Ph: [phone]       Fax: [phone]   RxID:   [id number] HYOMAX-SR 0.375 MG XR12H-TAB (HYOSCYAMINE SULFATE) take one tab b.i.d.  #10 x 5   Entered and Authorized by:   Louis Meckel MD   Signed by:   Harlow Mares CMA (AAMA) on 01/13/2010   Method used:   Electronically to        [facility] (retail)       [address]       [place], [place]  [address]       Ph: [phone]       Fax: [phone]   RxID:   [id number]

## 2010-10-01 ENCOUNTER — Ambulatory Visit: Payer: Self-pay | Admitting: Cardiology

## 2010-10-14 ENCOUNTER — Encounter: Payer: Self-pay | Admitting: Cardiology

## 2010-10-21 LAB — CBC
HCT: 35.1 % — ABNORMAL LOW (ref 36.0–46.0)
HCT: 36.2 % (ref 36.0–46.0)
HCT: 36.8 % (ref 36.0–46.0)
HCT: 37.1 % (ref 36.0–46.0)
HCT: 37.8 % (ref 36.0–46.0)
Hemoglobin: 12.2 g/dL (ref 12.0–15.0)
Hemoglobin: 12.7 g/dL (ref 12.0–15.0)
Hemoglobin: 12.9 g/dL (ref 12.0–15.0)
Hemoglobin: 13.5 g/dL (ref 12.0–15.0)
MCH: 31.7 pg (ref 26.0–34.0)
MCH: 32.3 pg (ref 26.0–34.0)
MCH: 32.4 pg (ref 26.0–34.0)
MCHC: 34.8 g/dL (ref 30.0–36.0)
MCHC: 35.1 g/dL (ref 30.0–36.0)
MCHC: 36 g/dL (ref 30.0–36.0)
MCHC: 36.4 g/dL — ABNORMAL HIGH (ref 30.0–36.0)
MCV: 92.5 fL (ref 78.0–100.0)
RBC: 3.85 MIL/uL — ABNORMAL LOW (ref 3.87–5.11)
RBC: 3.93 MIL/uL (ref 3.87–5.11)
RBC: 4.06 MIL/uL (ref 3.87–5.11)
RDW: 12.4 % (ref 11.5–15.5)
RDW: 12.5 % (ref 11.5–15.5)

## 2010-10-21 LAB — APTT
aPTT: 31 seconds (ref 24–37)
aPTT: 86 seconds — ABNORMAL HIGH (ref 24–37)

## 2010-10-21 LAB — GLUCOSE, CAPILLARY

## 2010-10-21 LAB — URINALYSIS, ROUTINE W REFLEX MICROSCOPIC
Bilirubin Urine: NEGATIVE
Glucose, UA: NEGATIVE mg/dL
Hgb urine dipstick: NEGATIVE
Specific Gravity, Urine: 1.018 (ref 1.005–1.030)
Urobilinogen, UA: 0.2 mg/dL (ref 0.0–1.0)
pH: 5.5 (ref 5.0–8.0)

## 2010-10-21 LAB — DIFFERENTIAL
Basophils Relative: 1 % (ref 0–1)
Lymphs Abs: 1.6 10*3/uL (ref 0.7–4.0)
Monocytes Absolute: 0.4 10*3/uL (ref 0.1–1.0)
Monocytes Relative: 9 % (ref 3–12)
Neutro Abs: 2.3 10*3/uL (ref 1.7–7.7)
Neutrophils Relative %: 52 % (ref 43–77)

## 2010-10-21 LAB — CK TOTAL AND CKMB (NOT AT ARMC)
CK, MB: 1.4 ng/mL (ref 0.3–4.0)
Relative Index: INVALID (ref 0.0–2.5)
Total CK: 43 U/L (ref 7–177)

## 2010-10-21 LAB — CARDIAC PANEL(CRET KIN+CKTOT+MB+TROPI)
CK, MB: 1.5 ng/mL (ref 0.3–4.0)
CK, MB: 1.6 ng/mL (ref 0.3–4.0)
CK, MB: 1.6 ng/mL (ref 0.3–4.0)
Relative Index: INVALID (ref 0.0–2.5)
Relative Index: INVALID (ref 0.0–2.5)
Total CK: 76 U/L (ref 7–177)
Total CK: 77 U/L (ref 7–177)
Troponin I: 0.02 ng/mL (ref 0.00–0.06)
Troponin I: 0.02 ng/mL (ref 0.00–0.06)

## 2010-10-21 LAB — BASIC METABOLIC PANEL
CO2: 27 mEq/L (ref 19–32)
CO2: 27 mEq/L (ref 19–32)
CO2: 29 mEq/L (ref 19–32)
Calcium: 10.1 mg/dL (ref 8.4–10.5)
Calcium: 9.6 mg/dL (ref 8.4–10.5)
Creatinine, Ser: 0.93 mg/dL (ref 0.4–1.2)
GFR calc Af Amer: >60 mL/min (ref >60)
GFR calc Af Amer: >60 mL/min (ref >60)
GFR calc Af Amer: >60 mL/min (ref >60)
GFR calc non Af Amer: >60 mL/min (ref >60)
GFR calc non Af Amer: >60 mL/min (ref >60)
Glucose, Bld: 128 mg/dL — ABNORMAL HIGH (ref 70–99)
Glucose, Bld: 155 mg/dL — ABNORMAL HIGH (ref 70–99)
Potassium: 3.4 mEq/L — ABNORMAL LOW (ref 3.5–5.1)
Potassium: 3.9 mEq/L (ref 3.5–5.1)
Sodium: 139 mEq/L (ref 135–145)
Sodium: 140 mEq/L (ref 135–145)
Sodium: 145 mEq/L (ref 135–145)

## 2010-10-21 LAB — COMPREHENSIVE METABOLIC PANEL
ALT: 86 U/L — ABNORMAL HIGH (ref 0–35)
Calcium: 9.8 mg/dL (ref 8.4–10.5)
Creatinine, Ser: 0.89 mg/dL (ref 0.4–1.2)
GFR calc Af Amer: >60 mL/min (ref >60)
Glucose, Bld: 106 mg/dL — ABNORMAL HIGH (ref 70–99)
Sodium: 140 mEq/L (ref 135–145)
Total Protein: 7.5 g/dL (ref 6.0–8.3)

## 2010-10-21 LAB — PROTIME-INR
Prothrombin Time: 13.3 seconds (ref 11.6–15.2)
Prothrombin Time: 13.9 seconds (ref 11.6–15.2)

## 2010-10-21 LAB — LIPID PANEL
Cholesterol: 209 mg/dL — ABNORMAL HIGH (ref 0–200)
HDL: 42 mg/dL (ref >39)

## 2010-10-21 LAB — URINE MICROSCOPIC-ADD ON

## 2010-10-21 LAB — POCT CARDIAC MARKERS
CKMB, poc: <1 ng/mL — ABNORMAL LOW (ref 1.0–8.0)
Myoglobin, poc: 48.4 ng/mL (ref 12–200)
Troponin i, poc: <0.05 ng/mL (ref 0.00–0.09)

## 2010-10-21 LAB — PLATELET INHIBITION P2Y12
Platelet Function  P2Y12: 237 [PRU] (ref 194–418)
Platelet Function Baseline: 340 [PRU] (ref 194–418)

## 2010-10-21 LAB — TSH: TSH: 1.105 u[IU]/mL (ref 0.350–4.500)

## 2010-10-21 LAB — HEPARIN LEVEL (UNFRACTIONATED)
Heparin Unfractionated: 0.34 IU/mL (ref 0.30–0.70)
Heparin Unfractionated: 0.38 IU/mL (ref 0.30–0.70)

## 2010-10-21 LAB — MRSA PCR SCREENING: MRSA by PCR: NEGATIVE

## 2010-10-27 ENCOUNTER — Ambulatory Visit: Payer: Self-pay | Admitting: Cardiology

## 2010-11-09 LAB — COMPREHENSIVE METABOLIC PANEL
ALT: 82 U/L — ABNORMAL HIGH (ref 0–35)
ALT: 87 U/L — ABNORMAL HIGH (ref 0–35)
AST: 107 U/L — ABNORMAL HIGH (ref 0–37)
AST: 53 U/L — ABNORMAL HIGH (ref 0–37)
Albumin: 3.7 g/dL (ref 3.5–5.2)
Albumin: 4.4 g/dL (ref 3.5–5.2)
Alkaline Phosphatase: 76 U/L (ref 39–117)
Alkaline Phosphatase: 93 U/L (ref 39–117)
Calcium: 9.2 mg/dL (ref 8.4–10.5)
Creatinine, Ser: 0.9 mg/dL (ref 0.4–1.2)
GFR calc Af Amer: >60 mL/min (ref >60)
GFR calc Af Amer: >60 mL/min (ref >60)
Glucose, Bld: 120 mg/dL — ABNORMAL HIGH (ref 70–99)
Potassium: 4 mEq/L (ref 3.5–5.1)
Potassium: 4.3 mEq/L (ref 3.5–5.1)
Sodium: 139 mEq/L (ref 135–145)
Sodium: 142 mEq/L (ref 135–145)
Total Protein: 6 g/dL (ref 6.0–8.3)
Total Protein: 6.5 g/dL (ref 6.0–8.3)
Total Protein: 7 g/dL (ref 6.0–8.3)

## 2010-11-09 LAB — URINALYSIS, ROUTINE W REFLEX MICROSCOPIC
Ketones, ur: NEGATIVE mg/dL
Leukocytes, UA: NEGATIVE
Nitrite: NEGATIVE
Protein, ur: NEGATIVE mg/dL
Protein, ur: NEGATIVE mg/dL
Urobilinogen, UA: 1 mg/dL (ref 0.0–1.0)

## 2010-11-09 LAB — CBC
HCT: 30.3 % — ABNORMAL LOW (ref 36.0–46.0)
MCHC: 34.2 g/dL (ref 30.0–36.0)
MCHC: 34.6 g/dL (ref 30.0–36.0)
MCV: 93.6 fL (ref 78.0–100.0)
MCV: 93.8 fL (ref 78.0–100.0)
Platelets: 187 10*3/uL (ref 150–400)
Platelets: 273 10*3/uL (ref 150–400)
RBC: 3.23 MIL/uL — ABNORMAL LOW (ref 3.87–5.11)
RDW: 13.3 % (ref 11.5–15.5)
RDW: 13.4 % (ref 11.5–15.5)
WBC: 8.2 10*3/uL (ref 4.0–10.5)

## 2010-11-09 LAB — DIFFERENTIAL
Basophils Relative: 1 % (ref 0–1)
Eosinophils Absolute: 0.2 10*3/uL (ref 0.0–0.7)
Lymphocytes Relative: 35 % (ref 12–46)
Monocytes Absolute: 0.3 10*3/uL (ref 0.1–1.0)
Neutro Abs: 2.8 10*3/uL (ref 1.7–7.7)

## 2010-11-09 LAB — URINE CULTURE
Colony Count: NO GROWTH
Culture: NO GROWTH

## 2010-11-09 LAB — BASIC METABOLIC PANEL
BUN: 10 mg/dL (ref 6–23)
CO2: 26 mEq/L (ref 19–32)
Chloride: 110 mEq/L (ref 96–112)
Creatinine, Ser: 0.78 mg/dL (ref 0.4–1.2)
GFR calc Af Amer: >60 mL/min (ref >60)
Potassium: 3.9 mEq/L (ref 3.5–5.1)

## 2010-11-09 LAB — PROTIME-INR: INR: 1.03 (ref 0.00–1.49)

## 2010-11-09 LAB — URINE MICROSCOPIC-ADD ON

## 2010-11-10 LAB — CBC
MCV: 91.1 fL (ref 78.0–100.0)
Platelets: 217 10*3/uL (ref 150–400)
RDW: 13.2 % (ref 11.5–15.5)
WBC: 4.4 10*3/uL (ref 4.0–10.5)

## 2010-11-10 LAB — COMPREHENSIVE METABOLIC PANEL
AST: 49 U/L — ABNORMAL HIGH (ref 0–37)
Albumin: 4.1 g/dL (ref 3.5–5.2)
BUN: 16 mg/dL (ref 6–23)
Calcium: 9.7 mg/dL (ref 8.4–10.5)
Chloride: 103 mEq/L (ref 96–112)
Creatinine, Ser: 0.67 mg/dL (ref 0.4–1.2)
GFR calc Af Amer: >60 mL/min (ref >60)
Total Protein: 6.3 g/dL (ref 6.0–8.3)

## 2010-11-10 LAB — DIFFERENTIAL
Eosinophils Relative: 3 % (ref 0–5)
Lymphocytes Relative: 29 % (ref 12–46)
Lymphs Abs: 1.3 10*3/uL (ref 0.7–4.0)
Monocytes Absolute: 0.3 10*3/uL (ref 0.1–1.0)
Monocytes Relative: 8 % (ref 3–12)
Neutro Abs: 2.7 10*3/uL (ref 1.7–7.7)

## 2010-11-10 LAB — POCT CARDIAC MARKERS
CKMB, poc: <1 ng/mL — ABNORMAL LOW (ref 1.0–8.0)
Myoglobin, poc: 55 ng/mL (ref 12–200)

## 2010-11-15 LAB — BASIC METABOLIC PANEL
CO2: 31 mEq/L (ref 19–32)
Chloride: 108 mEq/L (ref 96–112)
GFR calc Af Amer: >60 mL/min (ref >60)
Glucose, Bld: 132 mg/dL — ABNORMAL HIGH (ref 70–99)
Potassium: 4.1 mEq/L (ref 3.5–5.1)
Sodium: 142 mEq/L (ref 135–145)

## 2010-11-15 LAB — POCT CARDIAC MARKERS
CKMB, poc: <1 ng/mL — ABNORMAL LOW (ref 1.0–8.0)
Myoglobin, poc: 62.7 ng/mL (ref 12–200)
Troponin i, poc: <0.05 ng/mL (ref 0.00–0.09)

## 2010-11-15 LAB — TROPONIN I: Troponin I: 0.02 ng/mL (ref 0.00–0.06)

## 2010-11-15 LAB — COMPREHENSIVE METABOLIC PANEL
Albumin: 3.8 g/dL (ref 3.5–5.2)
Alkaline Phosphatase: 62 U/L (ref 39–117)
BUN: 12 mg/dL (ref 6–23)
Calcium: 9.4 mg/dL (ref 8.4–10.5)
Potassium: 4.1 mEq/L (ref 3.5–5.1)
Total Protein: 6.2 g/dL (ref 6.0–8.3)

## 2010-11-15 LAB — CBC
HCT: 34.7 % — ABNORMAL LOW (ref 36.0–46.0)
HCT: 36.3 % (ref 36.0–46.0)
Hemoglobin: 12.9 g/dL (ref 12.0–15.0)
MCHC: 35 g/dL (ref 30.0–36.0)
MCHC: 35.6 g/dL (ref 30.0–36.0)
MCHC: 35.7 g/dL (ref 30.0–36.0)
Platelets: 203 10*3/uL (ref 150–400)
RBC: 3.9 MIL/uL (ref 3.87–5.11)
RDW: 12.9 % (ref 11.5–15.5)
RDW: 13.1 % (ref 11.5–15.5)
RDW: 13.3 % (ref 11.5–15.5)

## 2010-11-15 LAB — POCT I-STAT, CHEM 8
BUN: 15 mg/dL (ref 6–23)
Calcium, Ion: 1.2 mmol/L (ref 1.12–1.32)
Chloride: 104 meq/L (ref 96–112)
Creatinine, Ser: 1 mg/dL (ref 0.4–1.2)
Glucose, Bld: 104 mg/dL — ABNORMAL HIGH (ref 70–99)
HCT: 36 % (ref 36.0–46.0)
Hemoglobin: 12.2 g/dL (ref 12.0–15.0)
Potassium: 3.8 meq/L (ref 3.5–5.1)
Sodium: 139 meq/L (ref 135–145)
TCO2: 28 mmol/L (ref 0–100)

## 2010-11-15 LAB — CARDIAC PANEL(CRET KIN+CKTOT+MB+TROPI)
CK, MB: 1.3 ng/mL (ref 0.3–4.0)
Total CK: 76 U/L (ref 7–177)
Troponin I: 0.01 ng/mL (ref 0.00–0.06)

## 2010-11-15 LAB — LIPID PANEL
Cholesterol: 157 mg/dL (ref 0–200)
HDL: 35 mg/dL — ABNORMAL LOW (ref >39)
LDL Cholesterol: 52 mg/dL (ref 0–99)
Total CHOL/HDL Ratio: 4.5 RATIO
Triglycerides: 348 mg/dL — ABNORMAL HIGH (ref <150)

## 2010-11-15 LAB — DIFFERENTIAL
Basophils Absolute: 0 10*3/uL (ref 0.0–0.1)
Basophils Relative: 1 % (ref 0–1)
Neutro Abs: 2.3 10*3/uL (ref 1.7–7.7)
Neutrophils Relative %: 53 % (ref 43–77)

## 2010-11-15 LAB — APTT: aPTT: 28 seconds (ref 24–37)

## 2010-11-15 LAB — CK TOTAL AND CKMB (NOT AT ARMC): Relative Index: INVALID (ref 0.0–2.5)

## 2010-11-15 LAB — PROTIME-INR: INR: 1 (ref 0.00–1.49)

## 2010-11-30 ENCOUNTER — Encounter: Payer: Self-pay | Admitting: Cardiology

## 2010-12-01 ENCOUNTER — Encounter: Payer: Self-pay | Admitting: Cardiology

## 2010-12-01 ENCOUNTER — Ambulatory Visit (INDEPENDENT_AMBULATORY_CARE_PROVIDER_SITE_OTHER): Payer: Medicare Other | Admitting: Cardiology

## 2010-12-01 DIAGNOSIS — R55 Syncope and collapse: Secondary | ICD-10-CM

## 2010-12-01 DIAGNOSIS — Z13220 Encounter for screening for lipoid disorders: Secondary | ICD-10-CM

## 2010-12-01 DIAGNOSIS — E785 Hyperlipidemia, unspecified: Secondary | ICD-10-CM

## 2010-12-01 DIAGNOSIS — I2510 Atherosclerotic heart disease of native coronary artery without angina pectoris: Secondary | ICD-10-CM

## 2010-12-01 LAB — HEPATIC FUNCTION PANEL
ALT: 71 U/L — ABNORMAL HIGH (ref 0–35)
AST: 40 U/L — ABNORMAL HIGH (ref 0–37)
Bilirubin, Direct: 0.2 mg/dL (ref 0.0–0.3)
Total Bilirubin: 1.3 mg/dL — ABNORMAL HIGH (ref 0.3–1.2)

## 2010-12-01 LAB — BASIC METABOLIC PANEL
BUN: 17 mg/dL (ref 6–23)
CO2: 28 mEq/L (ref 19–32)
Calcium: 9.7 mg/dL (ref 8.4–10.5)
GFR: 68.46 mL/min (ref >60.00)
Glucose, Bld: 105 mg/dL — ABNORMAL HIGH (ref 70–99)
Potassium: 3.9 mEq/L (ref 3.5–5.1)
Sodium: 141 mEq/L (ref 135–145)

## 2010-12-01 LAB — LIPID PANEL: Total CHOL/HDL Ratio: 6

## 2010-12-01 NOTE — Assessment & Plan Note (Signed)
Patient sounds to have had micturition syncope. No further episodes in the past 4 months. LV function normal. Plan further evaluation including cardionet if recurrent episodes in the future.

## 2010-12-01 NOTE — Assessment & Plan Note (Signed)
Continue aspirin, beta blocker and ACE inhibitor. Not on a statin secondary to increased liver functions.

## 2010-12-01 NOTE — Assessment & Plan Note (Signed)
Continue present medications. Check potassium and renal function. 

## 2010-12-01 NOTE — Assessment & Plan Note (Signed)
Repeat liver functions. 

## 2010-12-01 NOTE — Patient Instructions (Signed)
Your physician recommends that you schedule a follow-up appointment in: 6 months with Dr. Crenshaw.  

## 2010-12-01 NOTE — Assessment & Plan Note (Signed)
Continue present medications. Check lipids.

## 2010-12-01 NOTE — Progress Notes (Signed)
HPI:[name] is a pleasant female who has a history of coronary artery disease status post coronary bypassing graft in 2005. She has had prior PCI of the saphenous vein graft to the right coronary artery. Admitted in early October of 2011 with recurrent chest pain. She was found to have severe three-vessel coronary artery disease. Her grafts were patent but there was a lesion in the saphenous vein graft to the PDA and she had a drug-eluting stent at that time. She was readmitted with recurrent atypical chest pain and ruled out. We discussed repeat catheterization versus medical therapy and she elected the latter. I last saw her in Oct of 2011. Since then, patient denies dyspnea on exertion, orthopnea, PND, chest pain. She states she had a syncopal episode 4 months ago. It occurred after urinating. There was no associated chest pain, palpitations or dyspnea.  Current Outpatient Prescriptions  Medication Sig Dispense Refill  . ALPRAZolam (XANAX) 0.5 MG tablet Take 0.5 mg by mouth as needed.        Marland Kitchen aspirin 81 MG tablet Take 81 mg by mouth daily.        Marland Kitchen atenolol (TENORMIN) 50 MG tablet Take 50 mg by mouth. Take 1 1/2 tablets daily       . cloNIDine (CATAPRES) 0.2 MG tablet Take 0.2 mg by mouth 2 (two) times daily.        . clopidogrel (PLAVIX) 75 MG tablet Take 75 mg by mouth daily.        . fish oil-omega-3 fatty acids 1000 MG capsule Take 2 g by mouth. Take 2 tablets two times daily       . furosemide (LASIX) 40 MG tablet Take 40 mg by mouth daily.        Marland Kitchen gemfibrozil (LOPID) 600 MG tablet Take 600 mg by mouth 2 (two) times daily.        . nitroGLYCERIN (NITROSTAT) 0.4 MG SL tablet Place 0.4 mg under the tongue every 5 (five) minutes as needed.        Marland Kitchen olmesartan (BENICAR) 40 MG tablet 1/2 tab po qd       . omeprazole (PRILOSEC OTC) 20 MG tablet Take 20 mg by mouth daily.        Marland Kitchen PARoxetine (PAXIL) 20 MG tablet Take 20 mg by mouth every morning.        Marland Kitchen DISCONTD: hyoscyamine (LEVBID) 0.375  MG 12 hr tablet Take 0.375 mg by mouth every 12 (twelve) hours as needed.        Marland Kitchen DISCONTD: Milk Thistle 140 MG CAPS Take by mouth 2 (two) times daily.        Marland Kitchen DISCONTD: olmesartan (BENICAR) 20 MG tablet Take 20 mg by mouth daily.           Past Medical History  Diagnosis Date  . Hypertension   . Hyperlipidemia   . Coronary artery disease   . Acute myocardial infarction, unspecified site, episode of care unspecified   . Esophageal reflux   . Diabetes mellitus     Borderline  . GERD (gastroesophageal reflux disease)   . Depression   . History of cholecystectomy     Past Surgical History  Procedure Date  . Bypass graft 08/2003    Quadruple   . Abdominal hysterectomy   . Cholecystectomy     History   Social History  . Marital Status: Married    Spouse Name: N/A    Number of Children: N/A  . Years of Education:  N/A   Occupational History  . Part-Time hair-dresser    Social History Main Topics  . Smoking status: Never Smoker   . Smokeless tobacco: Never Used  . Alcohol Use: No  . Drug Use: No  . Sexually Active:    Other Topics Concern  . Not on file   Social History Narrative  . No narrative on file    ROS: Complains of some pain in her lower sternal area but no fevers or chills, productive cough, hemoptysis, dysphasia, odynophagia, melena, hematochezia, dysuria, hematuria, rash, seizure activity, orthopnea, PND, pedal edema, claudication. Remaining systems are negative.  Physical Exam: Well-developed well-nourished in no acute distress.  Skin is warm and dry.  HEENT is normal.  Neck is supple. No thyromegaly.  Chest is clear to auscultation with normal expansion.  Cardiovascular exam is regular rate and rhythm.  Abdominal exam nontender or distended. No masses palpated. Extremities show no edema. neuro grossly intact  ECG Sinus bradycardia at a rate of 50. Nonspecific ST changes.

## 2010-12-02 ENCOUNTER — Encounter: Payer: Self-pay | Admitting: *Deleted

## 2010-12-04 ENCOUNTER — Telehealth: Payer: Self-pay | Admitting: Cardiology

## 2010-12-04 NOTE — Telephone Encounter (Signed)
Pt requesting results of blood work

## 2010-12-04 NOTE — Telephone Encounter (Signed)
pt aware of lab results [name]  

## 2010-12-22 NOTE — Assessment & Plan Note (Signed)
[facility]                            CARDIOLOGY OFFICE NOTE   ARMENTHA, BRANAGAN                       MRN:          [id number]  DATE:04/17/2007                            DOB:          06/09/1946    [name] returns for followup today.  She has a history of coronary  disease, status post coronary artery bypass graft, as well as PCI of the  saphenous graft to the right coronary artery (drug-eluding stent).  Her  most recent Myoview was performed on 10/11/2006.  There was minimal  apical thinning, but otherwise normal.  Her ejection fraction was 79%.  Since I last saw her, she is doing reasonably well.  She does complain  of fatigue and headaches at times.  She also has some muscle aches.  However, this does not appear to be a significant problem.  There is no  dyspnea, chest pain or syncope.   MEDICATIONS:  1. Vasotec 10 mg p.o. b.i.d.  2. Plavix 75 mg p.o. q. day.  3. Aspirin 81 mg p.o. q. day.  4. Atenolol 25 mg p.o. q. day.  5. Hydrochlorothiazide 12.5 mg p.o. q. day.  6. Paxil 20 mg p.o. q. day.  7. Fish oil.  8. Garlic.  9. Prevacid.  10.Triglide 180 mg p.o. q. day.  11.Prilosec.  12.Crestor 10 mg p.o. q. day.   PHYSICAL EXAMINATION:  Blood pressure of 146/86 and her pulse is 69.  She weighs 153 pounds.  HEENT:  Normal.  NECK:  Supple with no bruits.  CHEST:  Clear.  CARDIOVASCULAR:  Regular rate and rhythm.  ABDOMEN:  Benign.  EXTREMITIES:  No edema.   Her electrocardiogram shows a sinus rhythm at a rate of 61.  There are  minor nonspecific ST changes.   DIAGNOSES:  1. Coronary artery disease status post coronary artery bypass graft.      She has not had chest pain or shortness of breath, and her recent      Myoview showed no ischemia.  We will continue with medical therapy      including her aspirin, Plavix, Vasotec, beta-blocker and statin.  2. History of chronic chest pain.  3. History of drug-eluding stent to the  saphenous vein graft to the      right coronary artery.  She will continue on her aspirin and      Plavix.  4. Hypertension.  Her blood pressure is mildly elevated today, but it      typically runs in the 130/70 range at home.  We will make no      changes.  5. Hyperlipidemia.  She will continue on her statin, fish oil and      Triglide.  I will check a CK today given her mild myalgias.   She will continue with risk factor modification.  Note, she does not  smoke, she exercises routinely, and does follow a diet.  We will see her  back in 6 months.     Madolyn Frieze Jens Som, MD, [facility]  Electronically Signed    BSC/MedQ  DD: 04/17/2007  DT: 04/18/2007  Job #: [id number]

## 2010-12-22 NOTE — Assessment & Plan Note (Signed)
[facility]                            CARDIOLOGY OFFICE NOTE   [name], [name]                       MRN:          [id number]  DATE:05/09/2008                            DOB:          [birth year]    [name] is a pleasant female who has a history of coronary artery  disease status post coronary bypassing graft in 2005.  Her most recent  Myoview was performed on October 11, 2006.  At that time, she had normal  perfusion with minimal apical thinning.  Her ejection fraction was 79%.  Since I last saw her, she is doing well from a symptomatic standpoint.  She has not had chest pain, dyspnea, or syncope.  There is no pedal  edema.  She has had some medications added recently for an elevated  blood pressure.   Medications include:  1. Fenofibrate 134 mg p.o. daily.  2. Clonidine 0.2 mg p.o. b.i.d. as well as nightly.  3. Atenolol 100 mg p.o. daily.  4. Lasix 40 mg p.o. daily.  5. Enalapril 10 mg p.o. b.i.d.  6. Multivitamin.  7. Vitamin D.  8. Calcium.  9. Plavix 75 mg p.o. daily.  10.Aspirin 81 mg p.o. daily.  11.Fish oil.  12.Garlic.  13.Prilosec.  14.Crestor 20 mg.   PHYSICAL EXAMINATION:  VITAL SIGNS:  Blood pressure of 120/72 and pulse  a of 55.  She weighs 161 pounds.  HEENT:  Normal.  NECK:  Supple with no bruits.  CHEST:  Clear.  CARDIOVASCULAR:  Regular rate and rhythm.  ABDOMEN:  No tenderness.  EXTREMITIES:  No edema   Her electrocardiogram shows a sinus rhythm at a rate of 53.  The axis is  normal.  There is poor R-wave progression, but there are no ST changes  noted.   DIAGNOSES:  1. Coronary artery disease - we will continue the aspirin, Plavix,      beta-blocker, and statin.  2. History of chronic chest pain - her symptoms are improved.  We will      continue with medical therapy.  3. History of drug-eluting stent to the saphenous vein graft to the      right coronary artery.  4. Hypertension - blood pressure is  adequately controlled on present      medications.  I will check a BMET given her diuretic and ACE      inhibitor use.  5. Hyperlipidemia - we will check lipids and liver and adjust as      indicated.  Her goal LDL will be less than 70 given her history of      coronary disease.   We will see her back in 9 months.  I have discussed the importance of  diet and exercise with [name] and she is making good effort towards  this.     Madolyn Frieze Jens Som, MD, [facility]  Electronically Signed    BSC/MedQ  DD: 05/09/2008  DT: 05/09/2008  Job #: [id number]

## 2010-12-22 NOTE — H&P (Signed)
NAMEJOSSELIN, [name]                ACCOUNT NO.:  [id number]   MEDICAL RECORD NO.:  [id number]          PATIENT TYPE:  INP   LOCATION:  2504                         FACILITY:  [facility]   PHYSICIAN:  Noralyn Pick. Eden Emms, MD, FACCDATE OF BIRTH:  12-May-1946   DATE OF ADMISSION:  02/11/2009  DATE OF DISCHARGE:                              HISTORY & PHYSICAL   PRIMARY CARE PHYSICIAN:  Lovenia Kim, DO   PRIMARY CARDIOLOGIST:  Madolyn Frieze. Jens Som, MD, [facility]   CHIEF COMPLAINT:  Chest pain.   HISTORY OF PRESENT ILLNESS:  [name] is a 65 year old female with  known coronary artery disease.  She had bypass surgery in 2005 and a  stress test that was without ischemia in 2008.  She saw Dr. Jens Som on  February 04, 2009 and was asymptomatic.   Over the last 3-4 days, [name] has had multiple episodes of  substernal chest pain and left arm pain.  She has also noted some  paresthesias in her left arm.  She has had slight shortness of breath,  nausea, and slight diaphoresis, but no vomiting.  She has had multiple  episodes daily and over the last 3-4 days, has had more than 10 episodes  total.  She states that the pain will come multiple times during the  day, but is not particularly exertional.  Rest does not relieve it.  She  also tried antacids and Darvocet with no change.  Today for the first  time she tried sublingual nitroglycerin and her pain was relieved for  over an hour, but then returned.  She was concerned because of the  recurrence of the symptoms as well as the left arm numbness and pain,  which was a new symptom for her.  She describes the substernal chest  pain is an ache and the left arm pain as an ache and a cramping pain.  Prior to her bypass surgery, she had chest pain that is similar to the  symptoms she is having now, but no left arm pain.  Also she knows that  recently she has had nausea with exertion, but no recent chest pain with  exertion.  Currently in the ER, she was  having 4/10 the chest pain with  6/10 left arm pain.  However, after being started on IV nitroglycerin,  her symptoms resolved.  Currently, she is resting comfortably.   PAST MEDICAL HISTORY:  1. Status post aortocoronary bypass surgery in January 2005 with LIMA      to LAD, SVG to OM-2, SVG to OM-3 and PDA.  2. Status post cardiac catheterization in July with LAD 90%, LIMA to      LAD patent, diagonal 40-50%, ramus intermedius 50-60%, circumflex      totaled with vein grafts to the OM-2 and OM-3 patent, RCA totaled      with SVG to PDA patent with 90% compression, status post TAXUS      stent to the SVG to PDA.  3. Hypertension.  4. Hyperlipidemia.  5. Remote history of MI.  6. History of osteoarthritis and back pain.  7.  History of lower extremity edema.  8. Gastroesophageal reflux disease and dysphagia.  9. Borderline diabetes.  10.History of depression.  11.History of headaches.  12.History of insomnia.   SURGICAL HISTORY:  She is status post partial hysterectomy, cardiac  catheterizations, and bypass surgery.   ALLERGIES:  No known drug allergies.   CURRENT MEDICATIONS:  1. Atenolol 100 mg at bedtime.  2. Lasix 40 mg q.a.m.  3. Plavix 75 mg daily.  4. Crestor 10 mg q.a.m.  5. Lofibra 134 mg q.a.m.  6. Clonidine 0.2 mg 1 tablet at bedtime.  7. Extra Strength Tylenol 1 g at bedtime.  8. Prilosec 20 mg two tablets daily.  9. MiraLax 17 g daily.  10.Dulcolax p.r.n.  11.Paxil 20 mg daily.  12.Darvocet p.r.n.  13.Enalapril 40 mg at bedtime.   SOCIAL HISTORY:  [place].  She has never smoked.  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   FAMILY HISTORY:  Mother died at age 24 and her father died at age 78,  neither one having coronary artery disease, but her brother has had an  MI x2.   REVIEW OF SYSTEMS:  She has had some slight diaphoresis and nausea with  the chest pain.  She has  chronic problems with constipation.  She also  has chronic problems with musculoskeletal aches and pains and back pain  for which she takes the Darvocet.  She has not had any fevers or chills.  She feels her depression symptoms are well controlled on the Paxil.  She  has not noted any melena, hematemesis, or hemoptysis.  Full 14-point  review of systems is otherwise negative.   PHYSICAL EXAMINATION:  VITAL SIGNS:  Temperature is 98.1, blood pressure  173/76, pulse 58, respiratory rate 18, O2 saturation 98% on 2 liters.  GENERAL:  She is a well-developed, well-nourished, white female in no  acute distress.  HEENT:  Normal.  NECK:  There is no lymphadenopathy, thyromegaly, bruit, or JVD noted.  CV:  Her heart is regular in rate and rhythm with an S1, S2 and no  significant murmur, rub, or gallop is noted.  ABDOMEN:  Soft and nontender with active bowel sounds.  LUNGS:  Clear to auscultation bilaterally.  SKIN:  No rashes or lesions are noted.  EXTREMITIES:  There is no cyanosis, clubbing, or edema noted and distal  pulses are intact in all four extremities with no femoral bruits  appreciated.  MUSCULOSKELETAL:  There is no joint deformity or effusions and no spine  or CVA tenderness.  Neuro:  She is alert and oriented with cranial nerves II through XII are  grossly intact.   Chest x-ray:  No acute disease.   EKG:  Sinus bradycardia, rate 58 beats per minute with no acute ischemic  changes and no significant change from an EKG dated March 2008.   LABORATORY VALUES:  Hemoglobin 12.2, hematocrit 36, WBCs 4.4, platelets  244.  INR 1.0, PTT 28.  Sodium 139, potassium 3.8, chloride 104, BUN 15,  creatinine 1.0, glucose 104.  Initial point-of-care markers are  negative.   IMPRESSION:  [name] was seen today by Dr. Eden Emms.  She is a 65-year-  old female who is status post aortocoronary bypass surgery in 2005 with  a stent to one of her vein graft later that year.  She had a Myoview  in  March 2008 that was without ischemia.  Her substernal chest  pain was  relieved with nitroglycerin.  She has no EKG changes and her point-of-  care markers are negative.  However, because of the concerning nature  for an ischemic cause to her symptoms, she will be admitted and we will  continue to cycle cardiac enzymes.  She will be anticoagulated with  heparin and will be continued on the IV nitroglycerin, which relieved  her pain.  She will be scheduled for cardiac catheterization in a.m.  with further evaluation and treatment depending on the results.  She  will be continued on her home medications.      Theodore Demark, PA-C      Noralyn Pick. Eden Emms, MD, [facility]  Electronically Signed    RB/MEDQ  D:  02/11/2009  T:  02/12/2009  Job:  [id number]

## 2010-12-22 NOTE — Assessment & Plan Note (Signed)
[facility]                            CARDIOLOGY OFFICE NOTE   [name], [name]                       MRN:          [id number]  DATE:07/13/2007                            DOB:          12-02-1945    ADDENDUM   Ms. [name] blood work returned this afternoon. Her CK total is 85, her  NB was 1.9, and her troponin was 0.02. Given that the duration of her  symptoms was approximately 48 to 60 hours, she is essentially ruled out  for myocardial infarction. We will plan to proceed with a Myoview as  outlined in my previous note.     Madolyn Frieze Jens Som, MD, [facility]     BSC/MedQ  DD: 07/13/2007  DT: 07/14/2007  Job #: [id number]

## 2010-12-22 NOTE — Cardiovascular Report (Signed)
[name], [name]                ACCOUNT NO.:  [id number]   MEDICAL RECORD NO.:  [id number]          PATIENT TYPE:  INP   LOCATION:  2504                         FACILITY:  [facility]   PHYSICIAN:  Noralyn Pick. Eden Emms, MD, FACCDATE OF BIRTH:  1946-05-28   DATE OF PROCEDURE:  02/12/2009  DATE OF DISCHARGE:                            CARDIAC CATHETERIZATION   INDICATIONS:  A 65 year old patient admitted with chest pain, previous  coronary artery bypass surgery.   She has had a stent placed to the saphenous vein graft to the right PDA.   Cine catheterization was done from the left femoral artery.  The patient  apparently has scar tissue in the right side and was told by Dr. Juanda Chance  that future access may be easier from the left femoral artery.   PROCEDURE:  We placed a standard 5-French sheath in the left femoral  artery without difficulty.   Left main coronary artery had a 30% discrete stenosis.   Left anterior descending artery was 100% occluded after the first  diagonal branch.  There did appear to be some competitive flow, however.  Circumflex coronary artery was occluded after the takeoff of the first  obtuse marginal branch that appeared to be codominant.  The right  coronary artery was 100% occluded after the first RV branch.   The saphenous vein graft to a dominant appearing circulation was widely  patent.  There was a valve at the insertion point of the second obtuse  marginal branch.  The distal circumflex also filled the PDA.   The right coronary artery vein graft was somewhat difficult to  cannulate.  Initial cannulation was done with a long injection to forced  dye into the ostium.  The stent appeared widely patent.  There appeared  to be a 30-40% at most edge restenosis proximally.  The proximal portion  of the vein had 40% diffuse stenosis and was somewhat small.  The  patient had two large valves in the vein as well.  It supplied a small  PDA branch without critical  disease.   Left internal mammary artery was widely patent to the LAD with  retrograde filling of the diagonal branch.   RAO ventriculography:  RAO ventriculography was normal.  EF was 60%.  There was no gradient across the aortic valve.  No MR.  LV pressure was  130/19.  Aortic pressure was 130/60.   IMPRESSION:  The patient does not appear to have critical coronary  artery disease, although we did not fill the vein graft optimally we had  three separate pictures of it and there did not appear to be a critical  stenosis.  She had prolonged chest pain with negative enzymes and no EKG  changes.  In talking to Dr. Jens Som, he felt that the patient had some  anxiety as well.   Overall, I think her coronary status is stable since Dr. Juanda Chance placed  the stents to the vein graft to a codominant PDA.  She will be watched  overnight and discharged in the morning for continued medical therapy.      Noralyn Pick.  Eden Emms, MD, [facility]  Electronically Signed     PCN/MEDQ  D:  02/12/2009  T:  02/13/2009  Job:  [id number]

## 2010-12-22 NOTE — Assessment & Plan Note (Signed)
[facility]                            CARDIOLOGY OFFICE NOTE   [name], [name]                       MRN:          [id number]  DATE:07/13/2007                            DOB:          1946-04-15    [name] is a pleasant female who has a history of coronary artery  disease, status post coronary artery bypassing graft.  This was  performed in 2005.  She also has had problems with chronic chest pain  which has been very difficult to evaluate.  Her most recent Myoview was  on October 11, 2006, that showed an ejection fraction of 79%.  There was  minimal apical thinning but no ischemia or infarction was noted.   The patient presents today stating that she has had chest pain for the  past 2.5 days.  The pain is diffuse across her chest and described as an  aching sensation.  It does not radiate.  It is not positional nor is it  related to food.  It is not clearly exertional.  She does state that it  can increase with inspiration at times.  Note, it has been almost  continuous for the past 48-60 hours.  She did take nitroglycerin at one  point but is unclear whether this improved her symptoms.  She denied any  dyspnea.  She also complains of swelling of her tongue and mouth.  She  is also complaining of nausea at times.  She also continues to have  fatigue and some aching diffusely.  Note, she did loose her brother in  late November.  She is also tearful today and very anxious on  evaluation.   MEDICATIONS:  1. Vasotec 10 mg p.o. b.i.d.  2. Plavix 75 mg p.o. daily.  3. Aspirin 81 mg p.o. daily.  4. Atenolol 25 mg p.o. daily.  5. Hydrochlorothiazide 12.5 mg p.o. daily.  6. Paxil 20 mg p.o. daily.  7. Fish oil daily.  8. Garlic daily.  9. Prevacid 30 mg p.o. daily.  10.Triglide 180 mg p.o. daily.  11.Prilosec.  12.Crestor 10 mg p.o. daily.  13.Xanax as needed.  14.Extra Strength Tylenol.   PHYSICAL EXAMINATION:  VITAL SIGNS:  Show a blood  pressure of 154/78,  and her pulse is 78.  She weighs 155 pounds.  HEENT:  Normal.  NECK:  Supple.  CHEST:  Clear.  Note there is mild tenderness to palpation in her chest  area.  CARDIOVASCULAR:  Reveals a regular rate and rhythm.  ABDOMEN:  Shows no tenderness.  EXTREMITIES:  Show no edema.   Her electrocardiogram shows a sinus rhythm at a rate of 75.  There are  minor nonspecific ST changes.  When compared to the electrocardiogram of  April 17, 2007, there are no significant changes.   DIAGNOSES:  1. Chest pain.  Ms. Rieger is very difficult to evaluate as she has a      long history of chronic chest pain.  She does state that the pain      has been almost continuous for the past 48-60 hours.  She is also  very anxious in the office today, tearful and I wonder whether      there may be stress contributing to her symptoms, particularly      since her brother recently died.  Given that her symptoms have been      persistent for the past 48-62 hours, I will check a CK-MB and      troponin I now.  If these are normal then she is essentially ruled      out.  If they are negative, then we will plan to repeat her Myoview      to make sure that she has not developed any new ischemia.  2. Coronary artery disease.  She will continue all of her aspirin,      Plavix, beta-blocker, and Statin.  3. History of drug-eluting stent to the saphenous vein graft to the      right coronary artery.  She will continue on her aspirin and      Plavix.  4. Hypertension.  Her blood pressure is elevated today.  However, she      is also complaining of a thick tongue and a rash in her mouth.  We      will discontinue her ACE inhibitor to see if this is contributing.      Instead, I will add Norvasc 5 mg by mouth daily and we will      increase this as needed for blood pressure control.  5. Hyperlipidemia.  She will continue on her Statin, fish oil, and      Triglide.  A previous CK has been checked  which was normal given      her myalgias.   I will see her back in approximately 4 weeks to review her symptoms  assuming that her cardiac markers that we check today are normal and  note if they are abnormal then we will admit her to the hospital.   ADDENDUM:  Ms. Froemming blood work returned this afternoon. Her CK total  is 85, her NB was 1.9, and her troponin was 0.02. Given that the  duration of her symptoms was approximately 48 to 60 hours, she is  essentially ruled out for myocardial infarction. We will plan to proceed  with a Myoview as outlined in my previous note.     Madolyn Frieze Jens Som, MD, [facility]  Electronically Signed    BSC/MedQ  DD: 07/13/2007  DT: 07/13/2007  Job #: [id number]

## 2010-12-22 NOTE — Discharge Summary (Signed)
NAMELINSEY, ARTEAGA NO.:  [id number]   MEDICAL RECORD NO.:  [id number]          PATIENT TYPE:  INP   LOCATION:  2504                         FACILITY:  [facility]   PHYSICIAN:  Madolyn Frieze. Jens Som, MD, FACCDATE OF BIRTH:  10/11/1945   DATE OF ADMISSION:  02/11/2009  DATE OF DISCHARGE:  02/13/2009                               DISCHARGE SUMMARY   PRIMARY CARE PHYSICIAN:  Lovenia Kim, DO   PRIMARY CARDIOLOGIST:  Madolyn Frieze. Jens Som, MD, [facility]   DISCHARGE DIAGNOSIS:  1.Chest pain.  The patient ruled out for  myocardial infarction by EKG and serial cardiac markers.  She also  underwent a cardiac catheterization secondary to ongoing chest  discomfort.  The patient went to the cath lab on February 12, 2009, by Dr.  Charlton Haws.  The patient did not appear to have critical coronary  artery disease.  Overall, her coronary status was felt to be stable with  recommendations for continue medical therapy.  The patient with widely  patent stents.  Plavix can safely be discontinued.  1. Sinus brady.  Atenolol dose decreased from 100-75 mg this      admission.   PAST MEDICAL HISTORY:  1. Coronary artery disease, status post bypass in January 2005, with      alignment to LAD, SVG to the OM2, SVG to the [facility] and PDA.  2. Status post cardiac catheterization in July, status post Taxus      stent to the SVG to the PDA.  3. Hypertension.  4. Hyperlipidemia.  5. Remote history of MI.  6. Osteoarthritis.  7. Lower extremity edema.  8. GERD and dysphagia.  9. Borderline diabetes.  10.History of depression.  11.History of headaches.  12.History of insomnia.   HOSPITAL COURSE:  Ms. Bridgett is a 65 year old Caucasian female with  known history of coronary artery disease who presented with multiple  episodes of substernal chest discomfort, left arm pain, mild shortness  of breath and diaphoresis, but no vomiting.  The patient finally tried  nitroglycerin sublingual and states her pain  was relieved, but returned.  She finally presented to [facility] for further evaluation and was  admitted.  Initial cardiac markers were negative and the patient  ultimately ruled out for myocardial infarction by serial cardiac  markers.  She continued to have chest discomfort, however, and  ultimately it was decided to proceed with cardiac catheterization for  more definitive diagnosis.  The patient taken to the cath lab on the  February 12, 2009, by Dr. Charlton Haws, results as stated above.  The patient  tolerated the procedure without complications, was kept overnight for  further monitoring and noted to be sinus brady.  Atenolol dose decreased  to 75.  Plavix can be discontinued.  The patient is being discharged  home.  Follow up with Dr. Jens Som on March 28, 2009, at 12 noon.  He  will repeat LFTs in 12 weeks.   At time of discharge, hematocrit 36.1, potassium 4.1, and creatinine  0.9.  SGOT is 48 and SGPT is 64.   MEDICATIONS AT TIME OF  DISCHARGE:  1. Atenolol has been decreased to 75 mg daily.  A new prescription      provided.  2. Nitroglycerin p.r.n. prescription was provided.\  3. Plavix has been discontinued.  4. The patient may resume her Crestor 10 mg daily.  5. Lasix 20.  6. Aspirin 81.  7. Enalapril 40.  8. Claritin 20 mg at bedtime.  9. Tylenol PM.  10.Paxil 20 mg daily.  11.Prilosec 20 mg daily.  12.Darvocet as needed.   Duration of discharge encounter over 30 minutes.      Dorian Pod, ACNP      Madolyn Frieze. Jens Som, MD, [facility]  Electronically Signed    MB/MEDQ  D:  02/13/2009  T:  02/13/2009  Job:  [id number]   cc:   Lovenia Kim, D.O.

## 2010-12-22 NOTE — Assessment & Plan Note (Signed)
[facility]                            CARDIOLOGY OFFICE NOTE   WAI, MINOTTI                       MRN:          [id number]  DATE:09/04/2007                            DOB:          09-Sep-1945    [name] is a 66 year old female with past medical history of  coronary artery disease status post bypass graft in 2005.  She also has  chronic chest pain.  Most recent Myoview was performed on October 11, 2006.  It was normal with minimal apical thinning and her ejection fraction was  79%.  When I last saw on July 13, 2007 she had had 2-1/2 days of  continuous chest pain.  We did perform enzymes in the office.  They were  negative.  We scheduled her to have a Myoview but on the day was  scheduled she was not feeling well and she cancelled this.  Since that  time she has not had any further chest pain by report.  There is no  dyspnea or pedal edema.  She is having problems with mastitis at  present.   MEDICATIONS AT PRESENT:  Include Plavix 75 mg p.o. daily, aspirin 81 mg  daily, atenolol 25 mg daily, hydrochlorothiazide 12.5 mg daily, fish  oil, garlic, Prevacid, Triglide 180 mg daily, Prilosec, Crestor 10 mg  daily, Vasotec 20 mg p.o. b.i.d. and Paxil 40 mg p.o. daily.   PHYSICAL EXAM:  Shows a blood pressure 144/83 and pulse is 60.  She  weighs 160 pounds.  HEENT:  Normal.  Neck is supple with no bruits.  CHEST:  Clear.  CARDIOVASCULAR:  Regular rhythm.  ABDOMEN:  Exam shows no tenderness.  EXTREMITIES:  Show no edema.   DIAGNOSIS:  1. Chronic chest pain - Mrs. Boling has not had any further symptoms      since I saw her in December.  She cancelled her Myoview as she was      not feeling well.  We discussed repeat this but she would prefer to      decline this at present.  We will reschedule this and future if she      would like.  2. Coronary artery disease - she will continue on aspirin, Plavix,      beta blocker and statin.  She will  also continue on an ACE      inhibitor.  3. History of drug-eluting stent to saphenous vein graft to the right      coronary.  She will continue on aspirin and Plavix.  4. Hypertension - When I saw her previously we discontinued her ACE      inhibitor as she was complaining of thick tongue.  There was also      a rash in her mouth.  We added Norvasc.  However, this apparently      resolved that she is now back on her enalapril.  5. Hyperlipidemia - we will check lipids liver adjust as indicated.   We will continue risk factor modifications I will see back in 6 months.    Whitney Frieze Jens Som, MD, [facility]  Electronically Signed   BSC/MedQ  DD: 09/04/2007  DT: 09/04/2007  Job #: [id number]

## 2010-12-25 NOTE — Discharge Summary (Signed)
NAME:  [name], [name]                          ACCOUNT NO.:  [id number]   MEDICAL RECORD NO.:  [id number]                   PATIENT TYPE:  INP   LOCATION:  2001                                 FACILITY:  [facility]   PHYSICIAN:  Salvatore Decent. Cornelius Moras, M.D.              DATE OF BIRTH:  November 23, 1945   DATE OF ADMISSION:  08/13/2003  DATE OF DISCHARGE:  08/19/2003                                 DISCHARGE SUMMARY   ADMISSION DIAGNOSIS:  Chest pain.   PAST MEDICAL HISTORY:  1. Hypertension.  2. Hyperlipidemia.  3. Status post partial hysterectomy in 1994.   ALLERGIES:  No known drug allergies.   DISCHARGE DIAGNOSES:  1. Severe three-vessel coronary artery disease and class IV unstable angina,     status post coronary artery bypass graft x 4.  2. Postoperative anemia.   BRIEF HISTORY:  This is a 65 year old female with no previous cardiac  history who presented to the emergency department on August 09, 2003, with  atypical symptoms of chest pain possibly consistent with unstable angina.  The patient's risk factors include a history of hypertension,  hyperlipidemia, and a family history of coronary artery disease.  The  patient was seen in the emergency room and subsequently sent home.  The  patient then presented to the [facility] office for a stress  Cardiolite exam and was noted to have resting chest pain.  The patient was  admitted to the hospital on August 13, 2003, and ruled out for acute  myocardial infarction.  The patient underwent cardiac catheterization on  August 14, 2003, by Dr. Diona Browner which revealed severe three-vessel coronary  artery disease with preserved left ventricular function.  Subsequent to the  cardiac catheterization, the patient experienced recurrent symptoms of chest  pain and was brought back to the cardiac catheterization for placement of an  intra-aortic balloon pump for stabilization.  The patient was stable with no  signs of ischemia after placement  of the balloon pump.  CVTS has been asked  to see the patient for surgical consultation.  The patient was initially  evaluated by Dr. Tyrone Sage.  It was his opinion that surgical  revascularization was indicated for this patient.   HOSPITAL COURSE:  The patient presented to the [facility] for stress  Cardiolite exam on August 13, 2003.  The patient was noted to be having  resting chest pain.  Therefore, she was admitted to the hospital to rule out  acute myocardial infarction.  The patient then underwent cardiac  catheterization on August 14, 2003, by Jonelle Sidle, M.D.  Cardiac  catheterization results were notable for severe three-vessel coronary artery  disease with preserved left ventricular function.  After the cardiac  catheterization, the patient suffered from recurrent symptoms of chest pain  and was then returned to the catheterization laboratory for an intra-aortic  balloon pump placement for stabilization.  The patient was stable with no  signs of ischemia after placement of the intra-aortic balloon pump.  Prior  to the catheterization, the patient had been placed on heparin and  Integrilin.  The patient was evaluated by Dr. Tyrone Sage of CVTS on August 14, 2003, for possible surgical revascularization.  It was Dr. Dennie Maizes  opinion that the patient should in fact undergo coronary artery bypass  grafting.  The patient was taken to the OR on August 15, 2003, for coronary  artery bypass grafting x 4.  The left internal mammary artery was graft to  the LAD, the saphenous vein graft in sequence to OM2 and OM3, and saphenous  vein graft to the PD.  Endoscopic vein harvest was performed on the left  thigh and vein was taken in an open fashion from the left calf.  The patient  tolerated the procedure well and was hemodynamically on the unit  postoperatively.  The patient was extubated without problem and woke up from  anesthesia neurologically intact.  The patient was  successfully weaned from  the intra-aortic balloon pump without problem.  The patient was noted to  have anemia on postoperative day #1 with a hemoglobin of 8.4 and a  hematocrit of 23.7.  The platelet count was 185,000.  The patient began  cardiac rehabilitation on postoperative day #2 and tolerated this well.  The  patient's postoperative course has progressed as expected.  On postoperative  day #3, the patient was afebrile and the vital signs were stable.  Her heart  rate was noted to be 108 and therefore her beta blocker was increased.  Her  O2 saturation was 93% on room air.  The patient was ambulating without  difficulty and her wounds were healing well.  The patient is in stable  condition and it is felt that she will be ready for discharge within the  next one to two days.   LABORATORIES:  CBC on August 18, 2003:  White count 6.4, hemoglobin 9,  hematocrit 25.3, platelets 233.  BMP on August 18, 2003:  Sodium 141,  potassium 3.7, BUN 9, creatinine 0.7, glucose 119.   CONDITION ON DISCHARGE:  Improved.   DISCHARGE MEDICATIONS:  1. Aspirin 325 mg p.o. daily.  2. Colace 100 mg two tablets p.o. daily.  3. Toprol XL 50 mg p.o. daily.  4. Lipitor 40 mg p.o. daily.  5. Folic acid 1 mg p.o. daily.  6. Iron supplement 325 mg p.o. b.i.d.  7. Lasix 40 mg p.o. daily x 5 days.  8. K-Dur 20 mEq p.o. daily x 5 days.  9. Tylox one to two p.o. q.4-6h. p.r.n. pain.   ACTIVITY:  No driving.  No strenuous activity.  The patient is to continue  daily breathing and walking exercises.   DIET:  Low salt and cholesterol.   WOUND CARE:  Shower daily.  The patient may clean the incisions with soap  and water.   SPECIAL INSTRUCTIONS:  If the incisions become red, swollen, painful, or  drain or if the patient experiences fever of 101 degrees Fahrenheit, she is  to call the CVTS office at [phone].   FOLLOWUP APPOINTMENTS: 1. The patient will have a followup appointment established with Dr.      Diona Browner via [place].  This appointment should be set up for two weeks     after discharge.  2. The patient has a followup appointment with Dr. Cornelius Moras in three weeks.  The     CVTS office will call the patient with that appointment.  Pecola Leisure, PA                      Salvatore Decent. Cornelius Moras, M.D.    AY/MEDQ  D:  08/18/2003  T:  08/18/2003  Job:  [id number]   cc:   Jonelle Sidle, M.D. [facility]

## 2010-12-25 NOTE — Cardiovascular Report (Signed)
NAME:  [name], [name]                          ACCOUNT NO.:  [id number]   MEDICAL RECORD NO.:  [id number]                   PATIENT TYPE:  INP   LOCATION:  2031                                 FACILITY:  [facility]   PHYSICIAN:  Arturo Morton. Riley Kill, M.D. [facility]         DATE OF BIRTH:  October 17, 1945   DATE OF PROCEDURE:  02/17/2004  DATE OF DISCHARGE:  02/18/2004                              CARDIAC CATHETERIZATION   INDICATIONS:  [name] is a 65 year old who presented with what was  thought to be unstable angina.  She underwent catheterization which  demonstrated a lesion in the vein graft to the distal right circulation.  There was some ostial narrowing.  Given the situation, it was felt that in  the time frame a surgical consultation was obtained to review potential  options.  The patient had a kinking at the origin of the saphenous vein  graft to the right and then a valve in the mid vessel with at least some  moderate narrowing.  She was brought to the catheterization laboratory for  further evaluation and possible intervention.   PROCEDURE:  Relook of the saphenous vein graft to the posterior descending  branch.   DESCRIPTION OF PROCEDURE:  The patient was brought to the catheterization  laboratory and prepped and draped in the usual fashion.  Through an anterior  puncture the left femoral artery was entered.  A 7-French sheath was placed  in the left femoral artery.  A right coronary bypass with side holes was  utilized along with bivalirudin.  The patient had received pre procedural  Plavix.  Scout views of the vein graft were then obtained.  Following this I  called Dr. Ladona Ridgel and Dr. Antoine Poche who were in the hospital to the  laboratory and we reviewed the films in detail.  There was at least moderate  stenosis of the ostium but this looked better in many views than on the  previous study.  At most, it appeared to be about 60%.  There continued to  be some slight haziness in the mid  portion of the vein graft to the RCA, but  again, this did not appear to be critical.  After careful review and  discussion, we elected to recommend that the patient be treated  conservatively at this time with a trial of medical therapy prior to  consideration of intervention.  We also thought the patient might get a  stress Cardiolite to assess the severity of ischemia.  Continuous treatment  with Plavix would also be recommended.   ANGIOGRAPHIC DATA:  The saphenous vein graft to the distal right circulation  demonstrates a kink at the origin with about 70-80% narrowing.  This fills  the PDA which then fills retrograde into the right coronary.  In the mid  portion of the vessel is a venous valve.  Some of this is under filled but  there is also a slight degree of  haziness on the lateral wall.  At most,  this appears to be at 50% with a decent residual lumen.   CONCLUSION:  Moderate stenosis of the ostium in the mid portions of the  saphenous vein graft to the distal right coronary circulation with  discussion as above.   RECOMMENDATIONS:  1.  Trial of medical therapy including Plavix.  2.  Consideration of radionuclide imaging to assess severity of ischemia.  3.  Continuous follow-up with the patient with later intervention if      unimproved.                                               Arturo Morton. Riley Kill, M.D. [facility]    TDS/MEDQ  D:  04/15/2004  T:  04/16/2004  Job:  [id number]

## 2010-12-25 NOTE — Cardiovascular Report (Signed)
[name], BARLOWE NO.:  [id number]   MEDICAL RECORD NO.:  [id number]          PATIENT TYPE:  INP   LOCATION:  3729                         FACILITY:  [facility]   PHYSICIAN:  Rollene Rotunda, M.D.   DATE OF BIRTH:  25-Apr-1946   DATE OF PROCEDURE:  08/18/2004  DATE OF DISCHARGE:                              CARDIAC CATHETERIZATION   PRIMARY CARE PHYSICIAN:  Lovenia Kim, D.O.   CARDIOLOGIST:  Olga Millers, M.D.   PROCEDURE:  Left heart catheterization, selective coronary angiography.   INDICATIONS FOR PROCEDURE:  Evaluate patient with chest pain and shortness  of breath.  She has had previous bypass surgery and also stenting of a vein  graft to the right coronary artery.   DESCRIPTION OF PROCEDURE:  Left heart catheterization performed via the  right femoral artery.  The artery was cannulated using the anterior wall  puncture.  A #6 French arterial sheath was inserted via the modified  Seldinger technique.  Preformed Judkins and a pigtail catheter were  utilized.  The patient tolerated the procedure well and left the lab in  stable condition.   RESULTS:  HEMODYNAMICS:  LV 140/20, AO 143/72.  Coronaries, the left main  was normal.  The LAD had ostial 60% stenosis. There was proximal 90% mid  stenosis.  There was moderate diffuse disease in the mid segment. A mid  diagonal was large with ostial 50% stenosis. The circumflex and the AV  groove had proximal 25% stenosis and was occluded after the first obtuse  marginal.  The second obtuse marginal was large with ostial long, 80%  stenosis.  The third obtuse marginal was moderate and normal.  The  posterolateral was moderate and normal.  The distal circumflex was seen to  fill the via the vein graft. The right coronary artery was occluded  proximally. There was a large PDA which was normal.  Grafts limited to the  LAD was patent. The saphenous vein graft to the PA was patent. There was a  proximal 30% stenosis  at the sewing ring. There was mid long 30% stenosis.  There was a mid widely patent stent. The saphenous vein graft sequential to  OM to an OM3 was widely patent.   LEFT VENTRICULOGRAM:  The left ventriculogram was obtained in the RAO  projection.  The EF was 60% with normal wall motion.   CONCLUSION:  Three vessel native coronary artery disease.  Patent stents.  Well preserved ejection fraction.   PLAN:  The patient will have medical management.  Probable discharge in the  a.m.  She will continue to follow Dr. Elisabeth Most and Dr. Jens Som for workup  of her chest discomfort and shortness of breath.       JH/MEDQ  D:  08/18/2004  T:  08/18/2004  Job:  [id number]   cc:   Lovenia Kim, D.O.  [address]  [place]  [place] [address]  Fax: [phone]   Olga Millers, M.D. [facility]

## 2010-12-25 NOTE — Cardiovascular Report (Signed)
NAME:  [name], GILCREST NO.:  [id number]   MEDICAL RECORD NO.:  [id number]                   PATIENT TYPE:  INP   LOCATION:  6529                                 FACILITY:  [facility]   PHYSICIAN:  Jonelle Sidle, M.D. [facility]        DATE OF BIRTH:  [birth year]   DATE OF PROCEDURE:  08/14/2003  DATE OF DISCHARGE:                              CARDIAC CATHETERIZATION   PRIMARY CARE PHYSICIAN:  Dr. Eloise Harman   CARDIOLOGIST:  __________   INDICATIONS:  [name] is a 65 year old woman with a history of  hypertension and progressive chest discomfort.  She recently underwent an  outpatient Cardiolite which by verbal report showed evidence of  inferolateral ischemia.  She is admitted now for diagnostic coronary  angiography to outline coronary anatomy.   PROCEDURES PERFORMED:  1. Left heart catheterization.  2. Selective coronary angiography.  3. Left ventriculography.   ACCESS AND EQUIPMENT:  The area about the right femoral artery was  anesthetized with 1% lidocaine and a 6-French sheath was placed in the right  femoral artery via the modified Seldinger technique.  Standard preformed 6-  [place] and JR4 catheters were used for selective coronary angiography  and an angled pigtail catheter was used for left heart catheterization, left  ventriculography.  All exchanges were made over a wire and the patient  tolerated the procedure well without immediate complications.   HEMODYNAMICS:  1. Left ventricle 126/30 mmHg.  2. Aorta 123/76 mmHg.   ANGIOGRAPHIC FINDINGS:  1. The left main coronary artery has 20% proximal stenosis.  2. The left anterior descending is a medium caliber vessel with a diagonal     branch at mid vessel takeoff.  There is a 60% ostial left anterior     descending stenosis followed by an 80% proximal stenosis and 40-50% more     diffuse mid vessel stenosis.  3. The circumflex coronary artery is a medium to large caliber vessel with   three obtuse marginal branches and a ramus intermedius branch.  There is     60% diffuse stenosis in the proximal ramus intermedius branch and an 85%     hazy stenosis that is somewhat complex involving a large obtuse marginal     system in the mid circumflex level.  4. The right coronary artery is a small to medium caliber vessel with 60%     proximal diffuse stenosis culminating in an 80% mid vessel stenosis.   LEFT VENTRICULOGRAPHY:  Left ventriculography is performed in the RAO  projection revealing an ejection fraction of 60-65% with no significant  mitral regurgitation.   DIAGNOSES:  1. Three vessel coronary artery disease.  2. Left ventricular ejection fraction of 60-65% without significant mitral     regurgitation.   RECOMMENDATIONS:  I have discussed the results with __________.  Will have  interventional cardiology review the films but my suspicion is that coronary  artery bypass grafting will be  the definitive treatment.                                               Jonelle Sidle, M.D. [facility]    SGM/MEDQ  D:  08/14/2003  T:  08/14/2003  Job:  [id number]

## 2010-12-25 NOTE — Discharge Summary (Signed)
NAMEAUREA, [name] NO.:  [id number]   MEDICAL RECORD NO.:  [id number]          PATIENT TYPE:  INP   LOCATION:  3729                         FACILITY:  [facility]   PHYSICIAN:  Olga Millers, M.D. [facility] OF BIRTH:  1945/09/19   DATE OF ADMISSION:  08/17/2004  DATE OF DISCHARGE:                                 DISCHARGE SUMMARY   CARDIOLOGIST:  Olga Millers, M.D. [facility]   PRIMARY CARE PHYSICIAN:  Lovenia Kim, D.O. in [place], [place].   DISCHARGING DIAGNOSES:  1.  Atypical chest pain with negative cardiac enzymes.  2.  History of coronary artery disease status post coronary artery bypass      graft.  3.  Depression/anxiety.  4.  Hypertension.  5.  Hyperlipidemia.  6.  Status post cardiac catheterization this admission showing 3-vessel      native coronary artery disease with patent stents and well-preserved      ejection fraction.   HOSPITAL COURSE:  The patient initially admitted through the emergency room  after complaints of chest pain x1 month.  Radiating to the back and across  the shoulder blades.  Described it as an ache, occasionally nauseated and  shortness of breath with these episodes.  Positive pleuritic pain x1 day on  initial exam.  Cardiac enzymes with a troponin less than 0.01 x3.  BNP of  33.  The patient continued to complain of the discomfort and proceeded with  plans for cardiac catheterization, results as stated above.  The patient  tolerated procedure without any complications.  Seen by Dr. Jens Som on the  11th, afebrile, no complaints of chest pain.  Blood pressure 104/61, heart  rate 79, 96% on room air.  Right groin no hematoma or bruit.   Lab work showing a hemoglobin of 12.6, hematocrit 36.2, platelets of  306,000.  Sodium 143, potassium 3.9, glucose 126, BUN 13, creatinine 1.1.   The plan is to discharge the patient home today, followup with Dr. Jens Som  in 4 weeks.   DISCHARGE MEDICATIONS:  Continue her  current cardiac medications which  include  1.  Vasotec 20 mg p.o. b.i.d.  2.  Aspirin 81 mg daily.  3.  Plavix 75 mg daily.  4.  Lipitor 40 mg daily.  5.  Tricor 145 mg daily.  6.  Atenolol 25 mg daily,  7.  Paxil 10 mg daily.  8.  Hydrochlorothiazide 12.5 mg p.o. daily.  9.  The patient has been instructed to use Tylenol for __________ .  10. Nitroglycerin for chest discomfort.   DISCHARGE INSTRUCTIONS:  1.  Activity-no lifting x10 pounds x1 week.  No driving x2 days.  2.  She is to follow a low fat, low salt diet.  3.  Wound Care-gently clean catheterization site with soap and water.  No      tub bathing x1 week.  4.  She is to call our office for any fever, any increased pain or swelling      from catheterization site and schedule a followup appointment with Dr.      Jens Som in 4  weeks.       MB/MEDQ  D:  08/19/2004  T:  08/19/2004  Job:  [id number]   cc:   Lovenia Kim, D.O.  [address]  [place]  [place] [address]  Fax: [phone]

## 2010-12-25 NOTE — Cardiovascular Report (Signed)
NAME:  [name], [name]                          ACCOUNT NO.:  [id number]   MEDICAL RECORD NO.:  [id number]                   PATIENT TYPE:  INP   LOCATION:  6523                                 FACILITY:  [facility]   PHYSICIAN:  Charlies Constable, M.D. [facility]              DATE OF BIRTH:  1946/02/14   DATE OF PROCEDURE:  04/06/2004  DATE OF DISCHARGE:  04/07/2004                              CARDIAC CATHETERIZATION   CLINICAL HISTORY:  Ms. Loa is 65 years old and has had previous bypass  surgery performed in January of this year.  She developed recurrent chest  pain and was studied about six weeks ago by Dr. Riley Kill and found to have a  70-80% stenosis in the midportion of the vein graft to the distal right  coronary artery.  This was located at a valve and after much debate, it was  decided to treat her medically.  She has had persistent symptoms, and she  and Dr. Jens Som felt that re-look and probable intervention was indicated.   PROCEDURE:  The procedure was performed by the right femoral artery using an  arterial sheath and a 6 French right bypass graft guiding catheter with side  holes.  The patient was given Angiomax bolus and infusion and had been on  Plavix.  After taking the pictures, we felt the lesion in the midportion of  the vein graft was 70-80%.  We felt this was tight enough to warrant  intervention, although we were not certain that her symptoms were related to  this.  We passed a Luge wire down the vein graft across the lesion.  We pre-  dilated with a 2.5 x 15 mm Quantum Maverick, performing one inflation with  10 atmospheres for 30 seconds.  We then deployed a 2.5 x 20 mm Taxus stent,  deploying this with one inflation at 12 atmospheres for 30 seconds and post-  dilating with a 3.25 x 15 mm Quantum Maverick, performing three inflations  up to 16 atmospheres for 30 seconds.  Repeat diagnostic study was then  performed through the guiding catheter.  The patient tolerated the  procedure  well and left the laboratory in satisfactory condition.   RESULTS:  Initially the stenosis in the vein graft to the posterior  descending branch of the right coronary artery was located in the midportion  of the graft right at a valve.  It was estimated to be 80%.  Following  stenting, this improved to 0%.  There was no dissection seen.   CONCLUSION:  Successful stenting of the lesion in the midportion of the vein  graft to the posterior descending branch fo the right coronary artery with  improvement of sentinel narrowing from 80% to 0%.   DISPOSITION:  The patient was __________ for further observation.  Charlies Constable, M.D. [facility]    BB/MEDQ  D:  04/06/2004  T:  04/07/2004  Job:  [id number]   cc:   Olga Millers, M.D. [facility]   Lovenia Kim, D.O.  [address]  [place]  [place] [address]  Fax: [phone]   Cardiopulmonary Lab

## 2010-12-25 NOTE — H&P (Signed)
[name], [name] NO.:  [id number]   MEDICAL RECORD NO.:  [id number]          PATIENT TYPE:  EMS   LOCATION:  MAJO                         FACILITY:  [facility]   PHYSICIAN:  Rod Holler, MD     DATE OF BIRTH:  1946/07/06   DATE OF ADMISSION:  10/07/2006  DATE OF DISCHARGE:                              HISTORY & PHYSICAL   PRIMARY CARDIOLOGIST:  Madolyn Frieze. Jens Som, MD, [facility].   CHIEF COMPLAINT:  Chest pain.   HISTORY OF PRESENT ILLNESS:  [name] is a 65 year old female with a  history of coronary artery disease, status post coronary artery bypass  graft, who presents to the emergency department with complaints of chest  pain.  Over the past five days, the patient states that she has had  constant chest pain.  She describes this pain as both in her chest and  her back and has been constant over the past five days with no factors  that make the discomfort any better or any worse.  There is no  associated shortness of breath, no associated nausea or diaphoresis.  The patient saw Dr. Jens Som this week and was set up for a stress test.  They thought that this was noncardiac chest discomfort.  In the  emergency department, the patient received sublingual nitroglycerin.  She has had no syncope or presyncope.  No palpitations, no PND or  orthopnea, no lower extremity swelling.   PAST MEDICAL HISTORY:  1. Coronary artery disease, status post coronary artery bypass graft,      saphenous vein graft to RCA with a patent stent, saphenous vein      graft, sequential to OM II and OM III, LIMA to LAD that is patent.      The last cardiac catheterization in January, 2006.  2. Hypertension.  3. Hyperlipidemia.  4. GERD.   MEDICATIONS:  1. Vasotec 20 mg p.o. b.i.d.  2. Plavix 75 mg p.o. daily.  3. Aspirin 81 mg p.o. daily.  4. Tylenol 25 mg p.o. daily.  5. Hydrochlorothiazide 12.5 mg p.o. daily.  6. Paxil 20 mg p.o. daily.  7. Fish oil.  8. Prevacid.   ALLERGIES:   No known drug allergies.   SOCIAL HISTORY:  The patient is a nonsmoker.  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   FAMILY HISTORY:  Mother died with a history of stomach cancer.  Father  died at age 44 with coronary artery disease.   REVIEW OF SYSTEMS:  All systems are reviewed in detail and are negative  except as noted in the history of present illness.   PHYSICAL EXAMINATION:  VITAL SIGNS:  Temperature 98.6, heart rate 92,  respiratory rate 18, oxygen saturation 98%, blood pressure 135/84.  GENERAL:  A well-developed and well-nourished female who is alert and  oriented x3.  No apparent distress.  HEENT:  Atraumatic and normocephalic.  Pupils are equal, round and  reactive to light and accommodation.  Extraocular movements are intact.  NECK:  Supple.  No adenopathy.  No JVD.  No carotid bruits.  CHEST:  Lungs are  clear to auscultation bilaterally with equal breath  sounds.  CORONARY:  Regular rhythm.  Normal rate.  Normal S1 and S2.  No murmurs,  rubs or gallops.  ABDOMEN:  Soft, nontender, nondistended.  Active bowel sounds.  EXTREMITIES:  No clubbing, cyanosis or edema.  NEUROLOGIC:  No focal deficits.   LABS:  BNP 48.  White blood cell count 4.3, hematocrit 34.5, platelet  count 245.  Sodium 145, potassium 3.9, chloride 108, bicarb 28, BUN 12,  creatinine not done, glucose 111.  CK-MB less than 1, less than 1, less  than 1.  Troponin less than 0.05, less than 0.05, less than 0.05.  Myoglobin 68, 52, 56.   EKG shows a normal sinus rhythm, incomplete right bundle branch block,  possible right atrial enlargement.   IMPRESSION:  A 65 year old female with known coronary artery disease,  status post coronary artery bypass graft, who presents with chest pain.   PLAN:  1. Cardiovascular:  EKG, rule-out serial cardiac enzymes, admit the      patient to a telemetry bed.  Home medicines to include aspirin,      Plavix, ACE inhibitor, beta blocker.  No anticoagulation, given the       low likelihood that this is an acute coronary syndrome.  2. Pulmonary:  Patient had a normal chest x-ray on Tuesday.  3. GI:  Protonix daily.  4. Will make the patient n.p.o. for possible stress test in the      morning.      Rod Holler, MD  Electronically Signed     TRK/MEDQ  D:  10/08/2006  T:  10/08/2006  Job:  [id number]

## 2010-12-25 NOTE — Op Note (Signed)
NAME:  [name], [name]                          ACCOUNT NO.:  [id number]   MEDICAL RECORD NO.:  [id number]                   PATIENT TYPE:  INP   LOCATION:  2311                                 FACILITY:  [facility]   PHYSICIAN:  Salvatore Decent. Cornelius Moras, M.D.              DATE OF BIRTH:  1946-08-03   DATE OF PROCEDURE:  08/15/2003  DATE OF DISCHARGE:                                 OPERATIVE REPORT   PREOPERATIVE DIAGNOSIS:  Severe three vessel coronary artery disease, class  4 unstable angina.   POSTOPERATIVE DIAGNOSIS:  Severe three vessel coronary artery disease, class  4 unstable angina.   PROCEDURE:  Median sternotomy for coronary artery bypass grafting x 4 using  endoscopic vein harvest from left thigh (left internal mammary artery to  distal left anterior descending  coronary artery, saphenous vein graft to  second circumflex marginal branch and sequential saphenous vein graft to  third circumflex marginal branch, saphenous vein graft to posterior  descending coronary artery).   SURGEON:  Salvatore Decent. Cornelius Moras, M.D.   ASSISTANT:  Toribio Harbour, N.P.   ANESTHESIA:  General.   BRIEF CLINICAL NOTE:  The patient is a 65 year old female followed by Dr.  Brunilda Payor and Dr. Marisue Brooklyn with no previous cardiac history.  Risk  factors include a history of hypertension, hyperlipidemia, and a family  history of coronary artery disease.  The patient presents with atypical  symptoms of chest pain possibly consistent with unstable angina.  The  patient was seen in the emergency room  on December 31 and sent home.  She  presented to the [facility]iology office for a stress Cardiolite exam and  was noted to have resting chest pain.  She was admitted to the hospital and  ruled out for acute myocardial infarction.  She underwent cardiac  catheterization on the morning of August 14, 2003, by Dr. Simona Huh.  Findings at the time of catheterization were notable for severe three vessel  coronary artery disease with preserved left ventricular function.  That  afternoon, the patient suffered from recurrent symptoms of chest pain and  was brought back to the cardiac cath lab for intra-aortic balloon pump  placement for stabilization.  Cardiac surgical consultation was then  requested.  The patient was initially seen in consultation by Dr. Sheliah Plane.  Surgical revascularization was felt to be indicated.  The patient  remained stable with no signs of ischemia whatsoever after intra-aortic  balloon pump placement.   OPERATIVE CONSENT:  On the morning of January 6, the patient and her family  were counseled at length regarding the indications and potential benefits of  coronary artery bypass grafting.  Alternative treatment strategies have been  reviewed.  They understand and accept all the associated risks of surgery  including but not limited to the risks of death, stroke, myocardial  infarction, congestive heart failure, pneumonia, bleeding requiring blood  transfusion, arrhythmia,  infection, recurrent coronary artery disease.  They  desired to proceed.   OPERATIVE NOTE IN DETAIL:  The patient was brought to the operating room on  the above mentioned date and placed in a supine position on the operating  table.  Central monitoring is established by the anesthesia service under  the care and direction of Dr. Sampson Goon.  Specifically, a Swan-Ganz  catheter was placed through the right internal jugular approach.  A radial  arterial line is placed.  Intravenous antibiotics are administered.  Following induction with general endotracheal anesthesia, a Foley catheter  was placed.  The patient's chest, abdomen, both groins, and both lower  extremities are prepped and draped in a sterile manner.  A median sternotomy  incision was performed and the left internal mammary artery dissected from  the chest wall and prepared for bypass grafting.  The left internal mammary  artery  is a good quality conduit.  Simultaneously, saphenous vein was  obtained from the patient's left thigh using endoscopic vein harvest  technique.  The saphenous vein is a good quality conduit.  The patient was  heparinized systemically.   The pericardium was opened.  The ascending aorta is normal in appearance.  The ascending aorta and right atrium are cannulated for cardiopulmonary  bypass.  Adequate heparinization is verified.  Cardiopulmonary bypass was  begun and the surface of the heart is inspected.  Distal sites were selected  for coronary bypass grafting.  Portions of the saphenous vein and the left  internal mammary artery are trimmed to the appropriate length.  A  cardioplegia catheter was placed in the ascending aorta.  A temperature  probe was placed in the left ventricular septum.  The patient was cooled to  32 degrees systemic temperature.  The aortic Cross clamp was applied and  cardioplegia delivered initially in an antegrade fashion through the aortic  root.  Ice saline slush is applied for topical hypothermia.  The initial  cardioplegia arrest and myocardial cooling is felt to be excellent.  Repeat  doses of cardioplegia are administered intermittently throughout the  crossclamp portion of the operation, both through the aortic root and down  the subsequently placed vein grafts to maintain septal temperature below 15  degrees Centigrade.   The following distal coronary anastomoses are performed:   1. The posterior descending coronary artery is grafted with a saphenous vein     graft in an end-to-side fashion.  This coronary measured 1.3 mm in     diameter and is of good quality at the site of distal bypass.  The distal     right coronary artery at the level of the bifurcation is diffusely     diseased.  2. The second circumflex marginal branch is grafted with a saphenous vein     graft in a side-to-side fashion.  This coronary measures 1.5 mm in    diameter and is of  good quality at the site of distal bypass.  3. The third circumflex marginal branch is grafted using a sequential     saphenous vein graft off the vein placed to the second circumflex     marginal branch.  This coronary measures 1.0 mm in diameter and is of     fair quality at the site of distal bypass.  4. The distal left anterior descending coronary artery is grafted with the     left internal mammary artery in an end-to-side fashion. This coronary     measures 1.5 mm and is  of fair to good quality at the site of the distal     bypass.  There is diffuse disease proximally.   Both proximal saphenous vein anastomoses are performed directly to the  ascending aorta prior to removal of the aortic Cross clamp.  All air was  evacuated from the aortic root.  The septal temperature was noted to rise  appropriately and rapidly following reperfusion of the left internal mammary  artery.  The aortic Cross clamp is then removed after a total Cross clamp  time of 65 minutes.  The heart begins to beat spontaneously and normal sinus  rhythm resumes.  All proximal and distal anastomoses are inspected for  hemostasis and appropriate graft orientation.  Epicardial pacing wires are  affixed to the right ventricular outflow tract and to the right atrial  appendage.  The patient is rewarmed to greater than 37 degrees Centigrade  temperature.  The intra-aortic balloon pump is resumed and counter  polarization timing is verified.   The patient is weaned from cardiopulmonary bypass without difficulty.  The  patient's rhythm at separation from bypass is normal sinus rhythm.  No  inotropic support is required.  Total cardiopulmonary bypass time for the  operation is 84 minutes.  The venous and arterial cannulae are removed  uneventfully.  Protamine is administered to reverse the anticoagulation.  The mediastinum and the left chest are irrigated with saline solution  containing vancomycin.  Meticulous surgical  hemostasis is ascertained.  The  mediastinum and both left chest are drained with three chest tubes placed  through separate stab incisions inferiorly.  The median sternotomy is closed  in routine fashion.  The left lower extremity incisions are closed in  multiple layers in routine fashion. All skin incisions are closed with  subcuticular skin closure.   The patient tolerated the procedure well and is transported to the surgical  intensive care unit in stable condition.  There are no interoperative  complications.  All sponge, instrument, and needle counts were verified as  correct at the completion of the operation.  No blood products were  administered.                                               Salvatore Decent. Cornelius Moras, M.D.    CHO/MEDQ  D:  08/15/2003  T:  08/15/2003  Job:  [id number]   cc:   Cecil Cranker, M.D.   Jonelle Sidle, M.D. [facility]   Brunilda Payor, M.D.   Lovenia Kim, D.O.  [address], [address]  [place] [address]  Fax: [phone]   [facility]

## 2010-12-25 NOTE — Cardiovascular Report (Signed)
NAME:  [name], [name]                          ACCOUNT NO.:  [id number]   MEDICAL RECORD NO.:  [id number]                   PATIENT TYPE:  INP   LOCATION:  2031                                 FACILITY:  [facility]   PHYSICIAN:  Arturo Morton. Riley Kill, M.D. [facility]         DATE OF BIRTH:  [birth year]   DATE OF PROCEDURE:  02/14/2004  DATE OF DISCHARGE:                              CARDIAC CATHETERIZATION   INDICATIONS:  Ms. Stouffer is a 65 year old female who presents with recurrent  substernal chest pain.  She underwent emergency bypass surgery in January  2005 at which time she had an internal mammary placed to the LAD, sequential  saphenous vein graft placed to the obtuse marginal branches and saphenous  vein graft to the PDA.  She did well.  She now presents with recurrent chest  pain.  Her enzymes are negative.  The current study was done to assess  coronary anatomy.   PROCEDURES:  1. Left heart catheterization.  2. Selective coronary arteriography.  3. Selective left ventriculography.  4. Saphenous vein graft angiography x2.  5. Selective left internal mammary angiography x1.  6. Aortic root aortography.   DESCRIPTION OF PROCEDURE:  The patient was brought to the catheterization  lab and prepped and draped in the usual fashion.  Through an anterior  puncture, the right femoral artery was entered.  6 French sheath was placed.  Views of the left and right coronary arteries were obtained in multiple  angiographic projections.  Central aortic and left ventricular pressures  were measured with pigtail.  Ventriculography was performed in the RAO  projection.  Proximal root aortography was also performed to visualize her  grafts.  Saphenous vein graft angiography was performed with standard  Judkins catheters.  Internal mammary catheter was then used to engage the  internal mammary artery.  Overall, she tolerated the procedure well and  there were no complications.  She was taken to the holding  area in  satisfactory clinical condition.   HEMODYNAMIC DATA:  1. Central aorta:  133/67, mean 95.  2. Left ventricle:  134/19.  3. No gradient on pullback across the aortic valve.   ANGIOGRAPHIC DATA:  1. Ventriculography was performed in the RAO projection.  Overall systolic     function was well preserved and no segmental abnormalities or contraction     were identified.  2. The aortic root demonstrates no evidence of aortic dissection, aortic     regurgitation.  Importantly, the vein graft to the obtuse marginal branch     appears patent and the vein graft to the distal PDA appears to be     significantly narrow in the proximal portion.  3. The left main coronary artery was free of critical disease.  4. The LAD has a 90% focal stenosis leading into a large diagonal branch.     The LAD distally fills by competitive filling and there is what appears  to be some diffuse segmental irregularity of this vessel.  There is     competitive filling distally.  The diagonal itself has about 40-50%     proximal narrowing.  5. The internal mammary to the distal LAD is widely patent.  There is     vigorous filling and it also fills retrograde into the diagonal system.  6. There is a ramus intermedius vessel that has about 50-60% proximal     narrowing, but is a smaller caliber vessel.  7. The circumflex proper has a tiny proximal marginal branch, then the first     major marginal branch is patent.  Just beyond the marginal branch, the     circumflex itself is totally occluded.  8. Saphenous vein graft to the OM-2 and 3 appears to be widely patent with     good filling.  There is a venous valve in the middle of the graft near     the insertion site to the OM-2.  9. The right coronary artery is fairly diffusely diseased proximally and     then totally occluded in its mid portion.  10.      The saphenous vein graft to the PDA is patent.  However, there     appears to be compression to the  graft proximally right at its take off     with slightly more compression up to about 90% at the inferior portion of     the ring.  This was then segmentally narrowed just below this and then it     leads into the graft which then demonstrates also another venous valve.     This fills then the PDA which then fills the right retrograde.   CONCLUSION:  1. Continued patency of the internal mammary to the left anterior descending     with retrograde filling of the diagonal branch through a modestly     diseased left anterior descending.  2. 50-60% narrowing of a ramus intermedius.  3. Patent saphenous vein graft to the obtuse marginal branches.  4. Significant narrowing proximally in the vein graft to the posterior     descending artery.  5. Preserved left ventricular function.   DISPOSITION:  I have called Dr. Tyrone Sage.  We are going to get him to look  at the films.  The PDA is patent, and opening of the right graft would seem  to make sense.  However, it likely it is a technical narrowing from tapering  of both the vein as well as some mild compression from the inferior portion  of the graft ring.  The graft itself is probably borderline size with regard  to use of a drug-eluting platform.  However, this likely will be the best  long term option for the patient.  Given the early nature of the problem  early after surgery, I would like to have the surgeons available for any  complications when this is done and we will preload the patient with  clopidogrel prior to the procedure.  I have spoken with Dr. Tyrone Sage and he  will see the patient this evening.                                               Arturo Morton. Riley Kill, M.D. [facility]    TDS/MEDQ  D:  02/14/2004  T:  02/16/2004  Job:  [id number] 

## 2010-12-25 NOTE — Discharge Summary (Signed)
NAME:  [name], [name]                          ACCOUNT NO.:  [id number]   MEDICAL RECORD NO.:  [id number]                   PATIENT TYPE:  INP   LOCATION:  6523                                 FACILITY:  [facility]   PHYSICIAN:  Olga Millers, M.D. [facility]            DATE OF BIRTH:  10-02-1945   DATE OF ADMISSION:  04/04/2004  DATE OF DISCHARGE:  04/07/2004                                 DISCHARGE SUMMARY   PRIMARY CARDIOLOGIST:  Dr. Olga Millers.   PRIMARY CARE PHYSICIAN:  Dr. Marisue Brooklyn.   DISCHARGE DIAGNOSES:  1.  Status post percutaneous coronary intervention of saphenous vein graft      to right coronary artery.  2.  Coronary artery disease status post coronary artery bypass grafting.  3.  Hypertension.  4.  Increased cholesterol.   PAST MEDICAL HISTORY:  1.  Coronary artery disease status post coronary artery bypass grafting in      January 2005 by Dr. Tyrone Sage with a LIMA to the LAD, saphenous vein      graft to the OM2 and sequential grafts to the OM3, saphenous vein graft      to the PDA.  The patient is status post cardiac catheterization February 14, 2004, by  Dr. Riley Kill which revealed patent LIMA to the LAD, saphenous vein graft to  OM2 with sequential graft to OM3 that was patent, and a saphenous vein graft  to the right coronary artery/PDA which was narrowed proximally, but it was  not clear if this narrowing was flow limited as described above.  The  patient's last ventricular ejection fraction at that time was normal with no  wall abnormalities.  1.  Hypertension.  2.  Hyperlipidemia.  3.  Anxiety/insomnia.  4.  History of financial difficulties with medications.  5.  Status post partial hysterectomy in 1994.   BRIEF HISTORY OF PRESENT ILLNESS:  This 65 year old female presented to the  emergency room with atypical chest pain.  She states she has been extremely  fatigued with poor exercise tolerance and has been accompanied by atypical  chest pain and worse  fatigue over the last eight days.  On admission she  stated this was the first time she had actually been pain free in the past  eight days.  The patient's initial cardiac markers x2 were negative, the EKG  was not significantly different when compared to the EKGs in July.  The  patient, however, stated that the pain and discomfort she had been having  was identical to the discomfort she experienced in July 2005.  It was  decided to admit the patient to telemetry with chest pain admission orders,  continue her medications from home, also check the hemoglobin A1c,  eosinophils, sed rate.  UA was also done.  Magnesium and thyroid panel,  lipid panel was also done.  The patient continued with her Plavix and  81 mg  of aspirin, was started on enalapril 2.5 mg b.i.d.  Her Lipitor and  Tricor were also continued and cardiac enzymes also.  The plan initially was  to do an adenosine Cardiolite stress test.  It was decided if the patient  had no more chest pain, then we would do an outpatient Cardiolite instead.  On the 29th,  Dr. Jens Som saw the patient, symptoms still were somewhat atypical, no EKG  changes, and the enzymes were normal.  The patient however was very  concerned about the chest pain and wanted to have a cardiac catheterization  done.  Dr. Jens Som reviewed the films with Dr. Charlies Constable and Loraine Leriche  Pulsipher and decided to go ahead and do a cardiac catheterization on that  day.  Also due to the patient's complaints of increased fatigue, we decided  to decrease her atenolol to 25 mg.  As far as lab work that was done -- sed  rate came back normal at 5, her free T4 was 1.12, hemoglobin A1c was 4.7,  magnesium was 2, her TSH was 1.114.  Also cortisol levels were checked this  admission due to the patient's symptoms, her p.m. cortisol was 18.6, which  was slightly high, her random cortisol was 5.6 on the 27th and then the  random cortisol was 34.9 on the 28th.  Lipid profile was also  done this  admission, total cholesterol was 159, triglycerides 213, HDL 44, LDL 72.  Her cardiac enzymes continued to be negative.  The patient was taken to the  cath lab on the 29th and had a stent to the SVG to the RCA from 80-0%.  The  patient tolerated the procedure without any complications.  The plan was to  discharge the patient home in the a.m.  Dr. Jens Som saw the patient this  morning, the patient denied any chest pain or shortness of breath, she has  been afebrile, 98% on room air, heart rate 62, blood pressure 120/60, chest  was clear to auscultation, cardiovascular regular rate and rhythm, right  groin no hematoma or bruits.  Her discharging vitals, WBC 4.5, hemoglobin  11.6, hematocrit 32.9, her platelet count is 281,000.  Chemistry -- her  sodium was 140, potassium 3.5, her BUN is 18, creatinine 1, glucose 103.  The patient is being discharged to home.  I have instructed the patient to  discontinue her hydrochlorothiazide, discontinue her K-Dur, however the  patient will be supplemented with 40 mEq of K-Dur now before discharge.  Her  atenolol has been decreased to 25 mg daily.  Her new medication has been  added as Vasotec 5 mg p.o. daily.  The patient instructed to continue to her  Plavix, Tricor and Lipitor.  Prescriptions for these medications have been  given the patient along with a prescription for nitroglycerin, instructed to  use Tylenol for general discomfort.   ACTIVITY:  No driving or strenuous activity times two days.  No lifting over  10 pounds for one week.   DIET:  The patient instructed to follow a low fat, low salt, low cholesterol  diet.   WOUND CARE:  Gently clean cath site with soap and water, no tub bathing or  swimming times one week.   SPECIAL INSTRUCTIONS:  She is instructed to call our office for fever 101 or  greater, increased swelling or pain from cath site.  I have scheduled her for a followup appointment with Dr. Jens Som at our office on  April 27, 2004 at 10:45.  Dorian Pod, NP                       Olga Millers, M.D. [facility]    MB/MEDQ  D:  04/07/2004  T:  04/07/2004  Job:  [id number]   cc:   Lovenia Kim, D.O.  [address]  [place]  [place] [address]  Fax: [phone]   Olga Millers, M.D. [facility]

## 2010-12-25 NOTE — H&P (Signed)
NAME:  [name], [name]                          ACCOUNT NO.:  [id number]   MEDICAL RECORD NO.:  [id number]                   PATIENT TYPE:  INP   LOCATION:  2031                                 FACILITY:  [facility]   PHYSICIAN:  Duke Salvia, M.D.               DATE OF BIRTH:  1946/02/08   DATE OF ADMISSION:  02/14/2004  DATE OF DISCHARGE:                                HISTORY & PHYSICAL   We are asked to see [name] in the emergency room  by Dr. Paula Libra  because of presentation with chest pain.   She is a 65 year old woman with cardiac risk factors notable for  hypertension, dyslipidemia, and family history, who underwent three vessel  bypass in January of this year.  Postoperative electrocardiograms were  consistent with a postoperative or perioperative myocardial infarction.  The  patient has done well postoperatively, walking nearly a mild a day, losing  weight, etc.  Over the last two weeks, she has had problems with some degree  of pleuritic chest pain and this evening, developed discomfort in her chest  and her back similar to her pre-CABG angina.  There was, on this occasion,  no radiation, no shortness of breath, no nausea.  She presented to the  emergency room  where her electrocardiogram was not acute.  Her cardiac  enzymes were negative.  Her pain, at this point, has abated.  On arrival,  electrocardiogram demonstrated bigeminy.  Concurrent with the bigeminy  resolving she received nitroglycerin and her chest pain resolved.   Past medical history is notable for myalgias attributed to statins.  She has  taken long car trips to the beach recently.  She has had no unilateral leg  discomfort or swelling.  She is having some rash related to her iron which  she has subsequently discontinued on her own.   Her past surgical history includes a partial hysterectomy.   SOCIAL HISTORY:  She is married, she has two grown children.  She does not  use cigarettes, alcohol, or  recreational drugs.  She works Systems developer as a  Producer, television/film/video.   REVIEW OF SYMPTOMS:  In addition to above, is notable for some anxiety,  arthritis, leg cramps, and is otherwise, negative.   MEDICATIONS:  Ecotrin, Atenolol, HCTZ 50/25, Lipitor 40, TriCor 145,  potassium x 3, p.r.n. Xanax.   ALLERGIES:  She has no known drug allergies.   PHYSICAL EXAMINATION:  GENERAL: Middle to older aged Caucasian female appearing somewhat older than  her stated age of 31.  VITAL SIGNS:  Blood pressure 99/64, pulse 64, O2 saturation 98.  HEENT:  No xanthoma.  The neck veins were 7-8 cm.  The carotids were brisk  and full bilaterally without bruits.  BACK:  Without kyphosis or scoliosis.  LUNGS:  Clear.  HEART:  Regular without murmurs or gallops.  ABDOMEN:  Soft, with active bowel sounds without midline pulsation,  hepatomegaly, or epigastric tenderness.  EXTREMITIES:  Femora pulses were 2+, distal pulses were intact, there is no  cyanosis, clubbing, and edema.  NEUROLOGICAL:  Grossly normal.   Electrocardiogram dated tonight at 0155 demonstrated sinus rhythm at 63 with  intervals of 0.15 seconds, 0.10 seconds, 0.54, with axis of 40 degrees.  There was bigeminy with a morphology of left bundle superior axis.  Repeat  electrocardiogram demonstrated no further ventricular ectopy.  Review of  electrocardiograms  post CABG demonstrated the first postoperative ECG  manifested ST segment elevation in the inferior leads with subsequent Q-ing  out of the inferior leads on the second post CABG ECG.  Blood work this  evening demonstrates negative markers.   IMPRESSION:  1. Chest pain with features similar to pre-CABG chest pain although there is     some pleuritic component.  2. Ischemic cardiomyopathy.     A. Status post CABG in January 2005 for three vessel disease.     B. Ejection fraction 65%.     C. Post CABG ECG consistent with immediate post CABG inferior wall MI.  3. Bigeminy on arrival to the  emergency room  resolving concurrent with loss     of chest pain and nitroglycerin administration.  4. Myalgias.  5. Hypertension.  6. Some anxiety.   DISCUSSION:  [name] presents with chest pain that is quite atypical,  somewhat prolonged, but not dissimilar to her angina.  For that reason, we  will admit her and she will need to be monitored with surveillance enzymes.  I will defer to Dr. Jens Som whether she should undergo catheterization or  Cardiolite scanning; based on the postoperative ECGs from around the time of  her CABG, I suspect her Cardiolite will be abnormal but there may not be any  significant ischemia on there.  I have also told her to discontinue her  iron.                                                Duke Salvia, M.D.    SCK/MEDQ  D:  02/14/2004  T:  02/14/2004  Job:  [id number]   cc:   Lovenia Kim, D.O.  [address]  [place]  [place] [address]  Fax: [phone]

## 2010-12-25 NOTE — H&P (Signed)
NAMESOLEY, HARRISS                ACCOUNT NO.:  [id number]   MEDICAL RECORD NO.:  [id number]          PATIENT TYPE:  INP   LOCATION:  1843                         FACILITY:  [facility]   PHYSICIAN:  Arturo Morton. Riley Kill, M.D. [facility] OF BIRTH:  October 24, 1945   DATE OF ADMISSION:  08/17/2004  DATE OF DISCHARGE:                                HISTORY & PHYSICAL   CHIEF COMPLAINT:  Chest pain.   HISTORY OF PRESENT ILLNESS:  The patient is a 65 year old female who has a  one-month history of some back and chest discomfort.  This occurs across the  chest.  This has been worse for the past month or so.  She had a bypass last  January, and then subsequently developed symptoms, culminating in a stent  placed to the vein graft to the PDA in August of this past year.  The  symptoms were quiescent for approximately four months, but now she presents  with recurrent symptoms that are similar to what she had prior to her bypass  surgery.   The patient has a history of a coronary artery bypass graft surgery x4 in  January 2005, with the left internal mammary artery to the LAD, saphenous  vein grafts sequentially to the OM-II and OM-III, and saphenous vein graft  to the PDA.  Her last cardiac catheterization in August resulted in a stent  placed to the saphenous vein graft to the PDA, with an 80% stenosis, reduced  to 0%.  Her Cardiolite in July revealed an ejection fraction of 56%, with  breast attenuation, and no obvious ischemia.  There was abnormal uptake in  the left axilla.  She has hypertension and hyperlipidemia.  There has been  depression noted.   ALLERGIES:  No known drug allergies.   MEDICATIONS:  1.  Vasotec 20 mg p.o. b.i.d.  2.  Aspirin 81 mg daily.  3.  Plavix 75 mg daily.  4.  Lipitor 40 mg q.h.s.  5.  Tri-Chlor 145 mg daily.  6.  Atenolol 25 mg daily.  7.  Paxil 10 mg daily.  8.  Hydrochlorothiazide 12.5 mg daily.   PHYSICAL EXAMINATION:  GENERAL:  She is an alert and oriented  female, in no  acute distress.  She is well-appearing, alert, and in no distress.   VITAL SIGNS:  Blood pressure 150/83 in the hallway.  Respirations 18, pulse  75, temperature 99.7 degrees.  The SAO2 is 98%.   NECK:  There are no carotid bruits.   LUNGS:  Fields clear to auscultation and percussion.   HEART:  PMI was not checked because the patient is lying in the hallway at  the [facility].  The first and second heart  sounds were normal.   EXTREMITIES:  Revealed no edema.  Pulses are intact.   NEUROLOGIC:  Nonfocal.   The remainder of the examination was deferred because she was in the  hallway.   Electrocardiogram:  Revealed normal sinus rhythm, essentially within normal  limits.   LABORATORY DATA:  Studies are currently pending.   IMPRESSION:  1.  Recurrent  symptoms suggestive of her original presenting symptoms,      following a coronary artery bypass graft surgery.  2.  Status post percutaneous coronary intervention of the saphenous vein      graft to the posterior descending coronary artery.  3.  Status post coronary artery bypass graft surgery x4 in January 2005.  4.  Hypertension.  5.  Hyperlipidemia.  6.  Depression.   PLAN:  1.  Admit for serial enzymes and electrocardiograms.  2.  Discuss with Dr. Olga Millers potential workup, which might include a      repeat cardiac catheterization.       TDS/MEDQ  D:  08/17/2004  T:  08/17/2004  Job:  [id number]   cc:   Olga Millers, M.D. [facility]

## 2010-12-25 NOTE — H&P (Signed)
NAME:  [name], [name]                          ACCOUNT NO.:  [id number]   MEDICAL RECORD NO.:  [id number]                   PATIENT TYPE:  INP   LOCATION:                                       FACILITY:  [facility]   PHYSICIAN:  Cecil Cranker, M.D.             DATE OF BIRTH:  Aug 04, 1946   DATE OF ADMISSION:  08/13/2003  DATE OF DISCHARGE:                                HISTORY & PHYSICAL   CHIEF COMPLAINT:  Chest pain with abnormal Cardiolite.   BRIEF HISTORY:  This is a 65 year old female with no previous history of  coronary artery disease.  She has had intermittent chest pain for at least  one to two months.  Last Friday she had pain intermittently all day long.  She went to [facility] emergency room where she was seen and  evaluated by the emergency room physician who also discussed her case with  Dr. Jens Som.  She had three negative markers while in the emergency room  and a decision was made to release the patient to have an outpatient  Adenosine Cardiolite performed in the [facility] office.   The patient had an Adenosine Cardiolite performed in our office today that  was consistent with ischemia.  she did develop chest pain after the  Cardiolite.  This was eventually relieved with nitroglycerin.  Arrangements  were made to admit the patient to [facility] for further evaluation  and cardiac catheterization.   The patient says she has had chest pain for several months.  The pain has  been substernal radiating to her back described as a bad ache or like  indigestion.  She rates it as 7 to 8 on a 1 to 10 scale.  It is relieved  with rest and is brought on by exertion.  She had pain intermittently all  day Friday.  She has had associated shortness of breath.  No diaphoresis and  no nausea. As noted, she had pain today following her Cardiolite which was  interpreted as abnormal.  I do not have the final report at this time.  She  is to be admitted for cardiac  catheterization.   PAST MEDICAL HISTORY:  1. Cholesterol status is unknown.  2. Partial hysterectomy in 1994.  3. Problems with her right shoulder, probable degenerative joint disease.   ALLERGIES:  No known drug allergies.   CURRENT MEDICATIONS:  1. Atenolol.  2. Hydrochlorothiazide 50/25 mg one daily.  3. Tylenol Arthritis formula.  4. Tylenol PM.  5. Xanax one half tablet p.r.n.   SOCIAL HISTORY:  The patient worked as a Producer, television/film/video.  She is married.  She  watches TV and sews for hobbies.  She has two children.  She has never been  a smoker.  She does not drink alcohol.   FAMILY HISTORY:  Her father died at age 15, cause unknown.  Her mother died  at  age 28 from stomach cancer.  She has seven brothers living, one with  thyroid problems.  She had a sister who died at age 36.   REVIEW OF SYMPTOMS:  Essentially negative except for the following:  HEENT:  The patient wears glasses occasionally.  She had an ear infection  approximately one month ago.  She does have dentures.  CARDIOVASCULAR:  She  does have a history of hypertension.  GYN:  She is status post partial  hysterectomy.  She does have occasional hot flashes.  MUSCULOSKELETAL:  She  does have occasional leg cramps.   PHYSICAL EXAMINATION:  GENERAL:  This reveals a somewhat tearful, but  pleasant 65 year old white female in no acute distress.  VITAL SIGNS:  Blood pressure is 146/90, pulse is 107.  HEENT:  Unremarkable.  The neck reveals no bruits and no jugular venous  distention.  HEART:  Regular rate and rhythm without murmur.  LUNGS:  Clear.  ABDOMEN:  Soft and non-tender.  EXTREMITIES:  Pulses are intact without significant edema.  SKIN:  Warm and dry.   LABORATORY DATA:  An EKG today in the office shows sinus tachycardia, rate  107 beats per minute with nonspecific ST-T wave abnormalities.   IMPRESSION:  1. Chest pain.  2. Abnormal Adenosine Cardiolite performed today in our office.  3. History of  hypertension.  4. Unknown cholesterol status.  5. History of degenerative joint disease of the right shoulder.   PLAN:  As noted, the plan is to admit the patient for cardiac  catheterization.  She is on Atenolol and hydrochlorothiazide.  Apparently,  her potassium was recently low and this was supplemented. We will recheck  this upon admission to the hospital.      Delton See, P.A. [facility]                  Cecil Cranker, M.D.    DR/MEDQ  D:  08/13/2003  T:  08/13/2003  Job:  [id number]   cc:   Lovenia Kim, D.O.  [address]  [place]  [place] [address]  Fax: [phone]

## 2010-12-25 NOTE — Cardiovascular Report (Signed)
NAME:  [name], [name]                          ACCOUNT NO.:  [id number]   MEDICAL RECORD NO.:  [id number]                   PATIENT TYPE:  INP   LOCATION:  2929                                 FACILITY:  [facility]   PHYSICIAN:  Veneda Melter, M.D.                   DATE OF BIRTH:  1946-04-21   DATE OF PROCEDURE:  08/14/2003  DATE OF DISCHARGE:                              CARDIAC CATHETERIZATION   PROCEDURES PERFORMED:  1. Abdominal aortogram.  2. Placement of intraaortic balloon pump.   DIAGNOSES:  1. Three vessel coronary artery disease.  2. Unstable angina.   HISTORY:  [name] is a 65 year old white female who presents with  substernal chest discomfort.  The patient had an abnormal cardiac study and  underwent cardiac catheterization earlier today showing three vessel  coronary artery disease with well preserved LV function.  Plans were for  surgical assessment for coronary artery bypass graft surgery.  Unfortunately, the patient has developed severe substernal chest discomfort  refractory to medical therapy and she is referred for intraaortic balloon  pump.   TECHNIQUE:  Informed consent was obtained.  The patient was brought to the  catheterization laboratory.  A 9-French balloon pump sheath was positioned  in the right femoral artery using modified Seldinger technique.  1%  lidocaine was used as local anesthesia.  A 6-French pigtail catheter was  advanced into the abdominal aorta and abdominal aortogram using power  injections of contrast.  This showed the infrarenal artery to have very mild  with mild narrowing, but no critical stenosis or dilatation.  The renal  arteries were single and widely patent bilaterally.  The iliac arteries were  of normal caliber bilaterally.  A 36 mL intraaortic balloon pump was then  positioned in the descending thoracic aorta and seen under fluoroscopy to  operate appropriately.  She had appropriate augmentation, relief of chest  pain.  Balloon  pump was secured in position and the patient transferred to  the ICU in stable condition.   FINAL RESULT:  Successful placement of intraaortic balloon pump for  refractory angina.   PLAN:  [name] will be referred for coronary artery bypass graft surgery.                                               Veneda Melter, M.D.    Melton Alar  D:  08/14/2003  T:  08/15/2003  Job:  [id number]

## 2010-12-25 NOTE — Assessment & Plan Note (Signed)
[facility]                            CARDIOLOGY OFFICE NOTE   [name], [name]                       MRN:          [id number]  DATE:11/02/2006                            DOB:          19-Mar-1946    SUBJECTIVE:  [name] was recently seen on October 04, 2006, and set  up for an outpatient Myoview; however, she continued to have pain and  was admitted to [facility].  She did rule out for a  myocardial infarction with serial enzymes.  She was scheduled for an  outpatient Myoview, which was performed on October 11, 2006.  Her ejection  fraction was 79% and there was mild apical thinning but no ischemia or  infarction was noted.  Since that time she occasionally has an aching in  her chest, but she feels better.  There is no shortness of breath or  edema.   MEDICATIONS:  1. Vasotec 10 mg p.o. b.i.d.  2. Plavix 75 mg p.o. daily.  3. Aspirin 81 mg p.o. daily.  4. Atenolol 25 mg p.o. q.d.  5. Hydrochlorothiazide 12.5 mg p.o. q.d.  6. Paxil 20 mg p.o. q.d.  7. Fish oil.  8. Garlic.  9. Prevacid.  10.Triglide 180 mg p.o. q.d.  11.Crestor 5 mg p.o. q.d.   PHYSICAL EXAMINATION:  VITAL SIGNS:  Blood pressure 116/78, pulse 70.  NECK:  Supple.  CHEST:  Clear.  CARDIOVASCULAR:  A regular rate and rhythm.  EXTREMITIES:  No edema.   She did have an electrocardiogram today that shows a sinus rhythm at a  rate of 64.  There are minor nonspecific ST changes.   DIAGNOSES:  1. Chronic chest pain.  2. Coronary artery disease, status post coronary artery bypass graft.  3. History of percutaneous coronary intervention of the saphenous vein      graft to the right coronary artery (drug-eluting stent).  4. Hypertension.  5. Hyperlipidemia.   PLAN:  [name] is continuing to have occasional vague chest pain  that does not sound cardiac.  Her Myoview shows normal perfusion, mild  apical thinning and her LV function is preserved.  We  will continue with  medical therapy.  Note that we initiated Crestor on October 04, 2006,  and we will check lipids and liver today just as indicated.  She will  continue with risk factor modification.  Note that she is exercising  routinely and following a diet.  She does not smoke.   I will see her back in six months.  Also note that she did have carotid  Dopplers prior to her surgery in January 2005, that showed no  significant carotid disease.  She also has had a previous aortogram that  showed no aneurysm on August 14, 2003.     Madolyn Frieze Jens Som, MD, [facility]  Electronically Signed    BSC/MedQ  DD: 11/02/2006  DT: 11/02/2006  Job #: [id number]   cc:   Lovenia Kim, D.O.

## 2010-12-25 NOTE — Discharge Summary (Signed)
[name], [name]                ACCOUNT NO.:  [id number]   MEDICAL RECORD NO.:  [id number]          PATIENT TYPE:  INP   LOCATION:  3704                         FACILITY:  [facility]   PHYSICIAN:  ROSS, DR.              DATE OF BIRTH:  [birth year]   DATE OF ADMISSION:  10/07/2006  DATE OF DISCHARGE:  10/09/2006                               DISCHARGE SUMMARY   DISCHARGE DIAGNOSES:  1. Chest pain.  Electrocardiogram without acute ST-T wave changes.      Negative cardiac enzymes times three sets.  2. History of coronary artery disease, status post coronary artery      bypass graft, most recent cardiac catheterization in January of      2006 with patent grafts.  3. Hypertension.  4. Hyperlipidemia.  5. Gastroesophageal reflux disease.   HOSPITAL COURSE:  The patient is a 65 year old female with medical  history as stated above who presented to the emergency room complaining  of chest discomfort.  She states that she just recently saw Dr. Jens Som  and was scheduled for a stress Myoview this following Tuesday, however,  Friday evening, she states that the chest discomfort had become constant  in both her chest and her back.  No associated symptoms.  The patient  was admitted for observation and scheduled for a tentative stress  Myoview over the weekend.  Her cardiac enzymes were negative.  The  patient wished to be discharged home in follow up and follow up as an  outpatient for stress Myoview which has already been scheduled through  Dr. Jens Som.  Dr. Tenny Craw in to see the patient on the day of discharge.  The patient states that she has not had any further episodes of chest  discomfort since Friday night when she was at home and initially came to  the emergency room.  Blood pressure stable at 145/83.  The patient's sat  is 99% on room air, afebrile.  The patient is being discharged home with  instructions to continue her previous medications and follow up with her  stress Myoview  scheduled for this Tuesday in our office.   DISCHARGE MEDICATIONS:  As previously prescribed include Plavix 75,  aspirin 81, Vasotec 20 b.i.d., atenolol 25, HCTZ 12.5, Crestor 5, Paxil  20, Prevacid 30 and Tri Cor 145 and nitroglycerin as needed.  Duration  of discharge encountered 25 minutes.   DICTATED BY:  Dorian Pod, ACNP           ______________________________  Tenny Craw, DR.     Hadley Pen  D:  10/09/2006  T:  10/09/2006  Job:  [id number]   cc:   Lovenia Kim, D.O.

## 2010-12-25 NOTE — Discharge Summary (Signed)
NAME:  [name], [name]                          ACCOUNT NO.:  [id number]   MEDICAL RECORD NO.:  [id number]                   PATIENT TYPE:  INP   LOCATION:  2031                                 FACILITY:  [facility]   PHYSICIAN:  Olga Millers, M.D. [facility]            DATE OF BIRTH:  [birth year]   DATE OF ADMISSION:  02/14/2004  DATE OF DISCHARGE:  02/18/2004                                 DISCHARGE SUMMARY   ADMISSION DIAGNOSIS:  Chest pain.   PAST MEDICAL HISTORY:  1. Hypertension.  2. Hyperlipidemia.  3. Status post partial hysterectomy in '94.  4. History of anxiety.  5. Status post coronary artery bypass graft in January of this year.  6. Status post intraoperative or postoperative myocardial infarction with     previous coronary artery bypass graft surgery.   ALLERGIES:  No known drug allergies.   BRIEF HISTORY:  This is a pleasant 65 year old female who came in with  recurrent substernal chest pressure, history of emergency bypass surgery in  January 2005, at which time she had internal mammary placed to the LAD,  sequential saphenous vein graft placed to the obtuse marginal branches and  saphenous vein graft to the PDA.  She did well with this surgery.  Currently, the patient entered [facility] with chest pain; enzymes were  negative; EKG -- normal sinus rhythm.  Patient taken to the cath lab by Dr.  Arturo Morton. Stuckey on February 14, 2004.  Conclusion of catheterization:  1)  Continued patency of the internal mammary to the left anterior descending  with retrograde filling of the diagonal branch through a modestly diseased  left anterior descending; 2) 50% to 60% narrowing of a ramus intermedius; 3)  patent saphenous vein graft to the obtuse marginal branches; 4) significant  narrowing proximally in the vein graft to the posterior descending artery;  5) preserved left ventricular function.  Dr. Riley Kill spoke with Dr. Ramon Dredge  B. Gerhardt concerning catheterization.  Together, they  looked at the films.  The PDA is patent and opening of the right graft would seem to make sense.  However, it has technical narrowing from tapering of both the vein as well  as some mild compression from the inferior portion of the graft ring.  The  graft itself is probably borderline size with regard to use of a drug-  eluting platform; however, this likely will be the best long-term option for  the patient.  Given the early nature of the problem after surgery, the  surgeons were available for any complications that might occur.  The patient  was preloaded with Plavix prior to this procedure.  Dr. Tyrone Sage did see the  patient on the evening of the 8th, agreed with above notation, recommended  possible  PCI or stent to the right vein graft, patient taken back to the  cath lab on the 11th.  Also noted that CV[facility] made documentation on February 17, 2004, the morning before catheterization done; note states they agreed with  Dr. Dennie Maizes impression from February 14, 2004 and agree with plan for PCI of  SVG to PDA.  This vein graft looks to be relatively small portion of  saphenous vein proximally with a pancaked appearance.  They doubted that  this represents new atherosclerosis involving the aorta nor the vein; they  suspect that a PCI and stent might work well and they would continue to  follow if needed.  Dr. Riley Kill took patient back to the cath lab later that  day.  The common femoral artery sheath was placed.  The RCA to the saphenous  vein graft was injected, Angiomax given.  Dr. Riley Kill reviewed findings with  Dr. Doylene Canning. Ladona Ridgel and Dr. Rollene Rotunda.  There was still some narrowing,  minimal 60% of the SVG proximally, but substantially better than it appeared  on February 14, 2004 with previous cath.  Consensus at that time was to treat  conservatively by using Plavix and follow up with a stress Cardiolite later,  reviewed with Dr. Jens Som again and continued Plavix per Dr. Riley Kill.  Today,  patient being discharged home, no acute distress, sinus rhythm, vital  signs stable.   DISCHARGE LABORATORY WORK:  WBC 4.8, hemoglobin and hematocrit 12.7 and  36.5, platelets at 220,000.  Sodium 142, potassium 3.8, chloride 108, CO2  27, glucose 118, BUN 10, creatinine 1.0, calcium 9.4.   DISCHARGE MEDICATIONS:  Patient being discharged on the following  medications:  1. Plavix 75 mg 1 p.o. daily -- new medication.  2. Lipitor 40 mg 1 p.o. every evening.  3. Ecotrin 325 mg 1 p.o. daily.  4. Atenolol 50/25 mg.  5. Potassium -- patient states she takes 30 mg 3 tablets daily; we will     continue with this regimen.  6. Tricor 145 mg daily.   The patient expresses concerns about having medication money for financial  problems with filling Plavix.  Patient to stop by office and pick up 21-day  supply of Plavix.  I also spoke with case manager; she is to see if there is  some form of assistance from the company that makes Plavix to assist patient  with getting medications filled.  Patient also to continue getting samples  of Lipitor and Tricor from our office.   PAIN MANAGEMENT:  Tylenol for general discomfort.  Nitroglycerin for chest  discomfort, prescription given for nitroglycerin and Plavix.   ACTIVITY:  No driving or strenuous activity x2 days, no lifting over 10  pounds x1 week.   DIET:  Patient instructed to follow a low-fat, low-salt, low-cholesterol  diet.   WOUND CARE:  To gently clean cath site with soap and water.  No swimming or  tub-bathing x1 week.   FOLLOWUP:  Patient is scheduled for BMET to be drawn at our office on February 25, 2004; also, the patient is scheduled for an adenosine Cardiolite at our  office on February 25, 2004 at 8:30.  Patient to follow up with Dr. Jens Som at  the office on March 05, 2004 at 4 p.m.      Dorian Pod, NP                       Olga Millers, M.D. [facility]    MB/MEDQ  D:  02/18/2004  T:  02/18/2004  Job:  [id number]

## 2010-12-25 NOTE — Assessment & Plan Note (Signed)
[facility]                            CARDIOLOGY OFFICE NOTE   [name], [name]                       MRN:          [id number]  DATE:10/04/2006                            DOB:          [birth year]    [name] returns for followup today.  She is a pleasant female who has  a history of coronary disease, status post coronary artery bypass graft,  chronic chest pain, hypertension, and hyperlipidemia.  Since I last saw  her, she is again complaining of chest pain.  The pain is diffuse.  It  is not pleuritic and positional, nor is it related to food.  It is not  exertional.  It has been continuous for the past 3-4 days.  There is no  associated nausea, vomiting, or shortness of breath, and there is no  diaphoresis.  She denies any dyspnea.  She also is complaining of back  pain, headache, and lower extremity edema.   MEDICATIONS:  1. Vasotec 20 mg p.o. b.i.d.  2. Plavix 75 mg p.o. daily.  3. Aspirin 81 mg p.o. daily.  4. Atenolol 25 mg p.o. daily.  5. Hydrochlorothiazide 12.5 mg p.o. daily.  6. Paxil 20 mg p.o. daily.  7. Fish oil.  8. Garlic tablets.  9. Prevacid.  10.Triglide 180 mg p.o. daily.   PHYSICAL EXAMINATION:  VITAL SIGNS:  Her physical exam today shows a  blood pressure of 156/80 and a pulse of 65.  She weighs 148 pounds.  NECK:  Supple.  There are no bruits noted.  CHEST:  Clear.  CARDIAC:  Regular rate and rhythm.  ABDOMEN:  No pulsatile masses.  No bruits.  EXTREMITIES:  No edema.   Electrocardiogram shows a sinus rhythm at a rate of 65.  There is no RV  conduction delay, but there are no significant ST changes noted.   DIAGNOSES:  1. Chronic chest pain.  2. Coronary artery disease, status post coronary artery bypass graft.  3. History of percutaneous coronary intervention of the saphenous vein      graft to the right coronary artery (drug-eluting stent).  4. Hypertension.  5. Hyperlipidemia.   PLAN:  [name] is  complaining of chest pain.  She is difficult to  evaluate, as she has had problems with chronic chest pain previously.  She has had problems with chronic chest pain previously.  Her chest pain  has been persistent over the past four days without ever completely  resolving.  Her EKG is normal.  I doubt that it is cardiac.  We will  plan to proceed with a stress Myoview for risk stratification.  If it  shows no ischemia, then we will continue with medical therapy.  She is  not on a statin and states that this was discontinued, as Lipitor  increased her LFTs.  We will add Crestor 5 mg p.o. daily and check  lipids, liver, and a CK in four weeks.  We will also check a BMET at  that time.  She will continue with risk factor modification, and I will  see her back in six  months.  We will need to follow her blood pressure  closely and adjust her medications as indicated.  She does state that  she has had some pedal edema, but there is no evidence of that on exam  today.     Madolyn Frieze Jens Som, MD, [facility]  Electronically Signed    BSC/MedQ  DD: 10/04/2006  DT: 10/04/2006  Job #: [id number]   cc:   Lovenia Kim, D.O.

## 2010-12-25 NOTE — H&P (Signed)
NAME:  [name], [name] NO.:  [id number]   MEDICAL RECORD NO.:  [id number]                   PATIENT TYPE:  EMS   LOCATION:  MAJO                                 FACILITY:  [facility]   PHYSICIAN:  Verne Grain, MD                DATE OF BIRTH:  Feb 06, 1946   DATE OF ADMISSION:  04/04/2004  DATE OF DISCHARGE:                                HISTORY & PHYSICAL   PHYSICIANS:  1. Olga Millers, M.D., primary cardiologist.  2. Lovenia Kim, D.O., primary care physician in [place].   CHIEF COMPLAINT:  Atypical chest pain, status post recent cardiac  catheterization July 2005 with equivocal narrowing of a saphenous vein graft  to an RCA.   HISTORY OF PRESENT ILLNESS:  A 65 year old female with a history of  hypertension, hyperlipidemia, anxiety/insomnia, coronary artery disease,  status post four vessel coronary artery bypass grafting by Dr. Ramon Dredge B.  Gerhardt (August 15, 2003), ejection fraction normal, status post cardiac  catheterization for an episode of chest pain with negative cardiac markers  by Dr. Arturo Morton. Stuckey on February 14, 2004 with finding of all grafts patent,  50-60% narrow in a ramus and commentary regarding proximal narrowing of the  saphenous vein graft to the right coronary artery with commenting proximal  narrowing in the saphenous vein graft to the right coronary artery with a  question of technical etiology.  Discussed with Dr. Ramon Dredge B. Gerhardt with  consideration of attempted PCI with using drug-eluding stent with final  decision for return to the catheter lab for PCI; however, on relook  catheterization it was not clear that the stenosis was flow limiting  (estimated at approximately 60% by Dr. Arturo Morton. Stuckey).  These films were  reviewed with Dr. Doylene Canning. Ladona Ridgel and Dr. Rollene Rotunda with the final  decision for medical management and a follow-up Cardiolite in clinic  scheduled for February 25, 2004 on discharge.   Unfortunately, the patient cannot  recall if she had this Cardiolite done; however, reports that she feels that  the narrowing in her vein graft to her right coronary artery needs to be  fixed because she has been extremely fatigue with poor exercise tolerance  and has been accompanied by atypical chest pain and worse fatigue over the  past eight days.   The patient denies any shortness of breath.  She denies any exertional chest  pain.  She reports that she has had significant fatigue and not feeling well  since her presentation in July.  She has been able to do her job.  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 until the past eight days when  she reports just being so tired I can't hardly stand up.  The patient  denies any dizziness.  Denies lightheadedness.  She denies syncope or  presyncope.  She just reports being extremely fatigue and feel like she  needs to lay down.   Approximately eight days ago, this severe fatigue was accompanied by chest  pain with a radiation to the back and questionable diaphoresis, although no  shortness of breath and no nausea that has been present.  The severity is  approximately three out of ten, improving but not completely going away for  a period of hours waxing and waning day and night for the past eight days.   Per review of the computer records, the patient had a normal contrast chest  CT on March 09, 2004.  She is otherwise without complaints and again denies  shortness of breath and denies nausea.  She reports that in addition to her  fatigue, her insomnia has been particularly aggravating for which she has  been self-treating with Tylenol p.m.  She reports that she tried taking  sublingual nitroglycerin for her chest discomfort and had partial  improvement but reports that the patient was not completely relieved.  She  can elucidate no other relieving  factors.  She can recall no aggravating  factors.  The pain is not exertional and not associated with food to her  recollection.  The patient reports that she reported to the [facility] today because she has been feeling that her  energy has been so low that she needs to get the blood vessel and my heart  fixed.   The patient's pain currently is absent.  She reports spontaneous resolution  without specific therapy of the chest pain in the [facility] and states that this is the first time she has been pain-free in  the past eight days.  The patient's cardiac markers x 2 have been negative.  The patient's EKG is not significantly when compared to previous EKGs in  July.  The patient reports that the pain and discomfort that she has been  having has been identical to the discomfort she was experiencing in July of  2005 when her cardiac catheterization showed that all other grafts were  patent and no clear etiology for a cardiac source of chest pain at rest.   PAST MEDICAL HISTORY:  1. Coronary artery disease, status post coronary artery bypass grafting     (August 15, 2003) by Dr. Ramon Dredge B. Gerhardt with a LIMA to the LAD,     saphenous vein graft to the OM2, and sequential grafts to the OM3;     saphenous vein graft to the PDA.  Status post cardiac catheterization     February 14, 2004 by Dr. Arturo Morton. Stuckey which revealed patent LIMA to the     LAD, saphenous vein graft to OM2 with sequential graft to the OM3 that     was patent, and a saphenous vein graft to the right coronary artery/PDA     which was narrowed proximally but it was not clear if this narrowing was     flow limiting as described in the HPI.  The patient's left ventricular     ejection fraction on ventriculogram was normal with no wall motion     abnormalities.  Her aortic root shot revealed no evidence of dissections.    The patient's left main coronary artery was normal.   The patient's LAD     was 90% with competitive flow via the LIMA.  The patient's ramus was 50-  60%, left circumflex was occluded.  Right coronary artery was diffusely     diseased and occluded in its midsection with patent saphenous vein graft     to the PDA as described above.  2. Hypertension.  3. Hyperlipidemia.  4. Anxiety/insomnia.  5. History of financial difficulties with medications (Plavix, Lipitor, Tri-     Chlor).  6. Status post partial hysterectomy in 1994.   ALLERGIES:  No known drug allergies.   MEDICATIONS:  1. Plavix 75 mg p.o. q.d.  2. Lipitor 40 mg p.o. q.h.s.  3. Aspirin 81 mg p.o. q.d.  4. Atenolol/hydrochlorothiazide 50/25 mg p.o. 1 tablet p.o. q.d.  5. K-Dur 30 mEq p.o. q.d. (three 10 mEq tablets daily).  6. Tri-Chlor 145 mg p.o. q.d.  7. Sublingual nitroglycerin p.r.n.  8. Tylenol PM q.h.s. p.r.n.  9. Xanax 12.5 mg p.o. q.6h. p.r.n.   SOCIAL HISTORY:  The patient lives in [place] her husband  of four years.  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  She occasionally walks three-quarters of a mile to a mile per day, although  has not done that for the past eight days.  She has two children who are  healthy.  She has never used tobacco, alcohol, or illicit drugs in her past  per her report.   FAMILY HISTORY:  The patient's mother died at age 70 of stomach cancer and  had a history of diabetes prior to her demise.  The patient's father died at  age 74 with lots of things wrong with him but no heart trouble.  The  patient has seven brothers aging from 86 years old to 55 years old who are  reportedly healthy.  The patient has one sister who died during a routine  cholecystectomy at age 40 and was reportedly found to have Addison disease  on autopsy done at [facility] in [place].   PHYSICAL EXAMINATION:  VITAL SIGNS:  Temperature 7.2, heart rate 68,  respiratory rate 20, blood pressure 110/60.   Oxygen saturation 98% on room  air.  GENERAL:  The patient is well-appearing and in no distress.  HEENT:  She is normocephalic and atraumatic .  Extraocular movements intact.  Pupils are equal, round, and reactive to light.  Oropharynx is pink and  moist without lesions.  NECK:  Supple.  There are no bruits.  There are no jugular venous  distention.  CARDIOVASCULAR:  Regular S1 and S2.  LUNGS:  Clear to auscultation bilaterally.  SKIN:  No evidence of rash.  ABDOMEN:  Mildly overweight, soft, nontender, and nondistended with positive  bowel sounds.  No other abnormal findings.  Normal bowel sounds.  EXTREMITIES:  No evidence of edema.  PULSES:  Femoral pulses are 2+ and symmetric bilaterally.  There is no  evidence of femoral bruits.  Distal pulses are 2+ and symmetric in all four  extremities.  NEUROLOGICAL:  Grossly nonfocal.  The patient was alert and oriented x 3 with her strength and sensation grossly intact throughout.  The patient was  able to move all four extremities without difficulties.   LABORATORY DATA:  Chest x-ray essentially negative appearance, status post  CABG with no focal abnormalities, no focal infiltrates.  EKG sinus rhythm  at a rate of 66, normal axis, and normal intervals.  There was poor R-wave  progression noted with nondiagnostic T-wave abnormalities that were not  markedly change when compared to EKG obtained in July of  2005.   White blood cell count 4, hematocrit 35, platelet count 303,000 with 59%  segmented neutrophils, and 31% lymphocytes.  Sodium of 140, potassium of  3.5, chloride 108, bicarbonate 28, BUN of 20, creatinine of 0.9.  Glucose of  103, total bilirubin of 1.3, AST 26, ALT 36, alkaline phosphate 65, total  protein 6.8, albumin 4.4, d-dimer less than 0.22.  Myoglobin 25.  Troponin I  less than 0.05 x 2.  CK-MB less than 1.0 x 2 and anion gap of 4.  Calcium  9.4.  PTT 30, PT 12.3, INR 0.9.   ASSESSMENT:  A 65 year old female with  hypertension, hyperlipidemia,  anxiety/insomnia, coronary artery disease status post four vessel coronary  artery bypass graft (January 2005), status post cardiac catheterization for  chest pain with negative cardiac markers (July 2005 with Dr. Arturo Morton.  Stuckey) with presumably technical narrowing of saphenous vein graft to the  posterior descending artery.  Considered for percutaneous coronary  intervention with drug-eluding stent; however, on relook cardiac  catheterization the lesion did not appear to be flow limiting.  Upon  discussion with Dr. Rollene Rotunda and Dr. Doylene Canning. Ladona Ridgel, decisions were  made for medical management.  There was a perfusion study scheduled in  clinic for July 2005 with Dr. Olga Millers; however, the patient cannot  recall if this study was done.  The patient presents to the [facility] today complaining of progressive fatigue followed by  atypical chest pain x 8 days with electrocardiogram and cardiac markers  negative x 2.  Chest CT March 09, 2004 with contrast negative.  Positive  family history of fatal Addison disease.  Potassium of 3.5 on Dyazide,  diuretic with potassium supplementation.  History of financial difficulties  with medications.   PLAN:  1. Fatigue/atypical chest pain:  We will complete rule out of myocardial     infarction on telemetry with optimal cardiac medications, although chest     pain does not sound obviously cardiac in etiology (nonexertional) at     present all day x 8 days with negative cardiac markers, same pain as July     2005 with no clear explanation for patient's discomfort on cardiac     catheterization.  However, we will defer final recommendation to Dr.     Arturo Morton. Stuckey and Dr. Olga Millers who knows this patient well and     have spent a significant amount of time thinking about her and reviewing     her anatomy.  The patient's lacks an exertional history for her chest    discomfort and  has an approximately 60% stenosis down in the ramus as     well as in the vein graft narrowing that feeds the posterior descending     artery. Therefore, it seems reasonable that a treadmill Cardiolite might     help better establish exercise tolerance in a supervised setting as well     as exclude ischemia.  We will hold the patient's beta blocker and put in     an order for treadmill Cardiolite while Dr. Arturo Morton. Stuckey/Dr. Olga Millers make further consideration regarding the fate of the patient's     vein graft to her posterior descending artery.  We will also consider     other causes for fatigue such as depression.  Also, the patient has a     family history of fatal Addison disease.  The sister died of hypotension  with routine cholecystectomy at [facility] as stated in the family     history.  Therefore, we will check a Cortrosyn stem test on this patient.     We will also check a thyroid profile, screening sedimentation rate,     hemoglobin A1C, and urinalysis.  We will also try a empiric proton pump     inhibitor therapy in the case that any of the patient's chest pain     radiating to her back could be related to upper gastrointestinal     symptoms.  If the patient has ongoing chest discomfort for no clear     etiology, consideration of esophagogastroduodenoscopy could also b made.  2. Coronary artery disease:  We will continue on aspirin, Plavix, Lipitor,     Tri-Chlor, and initiation of ACE inhibitor in place of the diuretic.  We     will try Enalapril in light of the patient's previously expressed     concerns over the cost of her medications.  We will hold the patient's     Atenolol as mentioned above for possible treadmill Cardiolite.  3. Hypertension:  I would change diuretic to ACE inhibitor as described     above.  Goal blood pressure less than 135/85.  4. Hyperlipidemia:  We will check a lipid profile in the morning and     continue the patient on  Lipitor and Tri-Chlor with a goal LDL of less     than 70 and goal triglycerides of less than 150.  The patient's liver     function tests performed in the emergency room were essentially normal     with a total bilirubin of 1.3 just above normal limits.  ALT, AST, as     well as alkaline phosphate were all normal.  5. Anxiety/insomnia:  We will continue Xanax as previously prescribed and we     will add Ambien at bed time p.r.n. to assist the patient with her     insomnia.  6. Potassium at 3.5:  The patient is on a Thiazide diuretic which provides     the most likely cause for hypokalemia, although we will exclude Addison     disease with Cortrosyn stem test as mentioned above.  We will also     supplement the patient's potassium with 40 mEq p.o. x 1 and     discontinuation of the Thiazide diuretic while adding and titrating up     ACE inhibitor therapy as described above.  We will also check magnesium     with morning labs to look for other electrolyte abnormalities that may be    associated with diuretic use.  7. Financial difficulties:  The patient has commented on difficulties with     the cost of her medications in the past.  She has obtained Plavix samples     and paperwork for financial assistance to help with this.  We will     continue to keep the patient's medications in mind and defer duration of     Plavix therapy in this relatively young patient with severe coronary     artery disease to the patient's primary cardiologist, Dr. Olga Millers.                                                Verne Grain, MD  DDH/MEDQ  D:  04/04/2004  T:  04/04/2004  Job:  [id number]

## 2010-12-25 NOTE — Assessment & Plan Note (Signed)
[facility]                               LIPID CLINIC NOTE   ICIE, KUZNICKI                       MRN:          [id number]  DATE:11/13/2007                            DOB:          Jun 13, 1946    DATE OF SERVICE:  November 13, 2007.   PRIMARY CARDIOLOGIST:  Madolyn Frieze. Crenshaw, MD.   Patient is seen in [facility] on date of service, November 13, 2007,  for continued evaluation and medication titration associated with her  hyperlipidemia in the setting of documented coronary disease.  She has  been feeling and doing well overall, though she has a great deal of  stress on her.  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  The patient has been compliant  with her therapy.  She has had no muscle aches, pains, weakness, or  fatigue other than that that she usually has.  No dyspnea or pedal  edema.   CURRENT MEDICATIONS:  1. Plavix 75 mg daily.  2. Enteric-coated aspirin 81 mg daily.  3. Atenolol 25 mg daily.  4. Hydrochlorothiazide 12.5 mg daily.  5. Fish oil daily.  6. Garlic daily.  7. Prevacid has been substituted with Prilosec 20 mg daily.  8. Crestor is now 20 mg daily.  9. Tracilon is 160 mg every other day with food.  10.Enalapril 10 mg twice daily.   REVIEW OF SYSTEMS:  As stated in the HPI and otherwise negative.   DRUG ALLERGIES:  None.   PHYSICAL EXAMINATION:  VITAL SIGNS:  Blood pressure of 138/74,  respirations are 17, pulse is 62.   Labs have not been obtained recently.   ASSESSMENT:  The patient will need to have more current labs drawn.  I  have spent time talking with her about her history of  hypertriglyceridemia and the causes of secondary hypertriglyceridemia  including diabetes that is hidden or hyperthyroidism.  For ease to the  patient, I have given her a prescription to have these labs drawn in  [place] at the [facility], and she will call me with questions or  problems associated with that and will follow up with her based on that  finding as soon as those labs are available.  The patient's questions  have been answered, 45 minutes have been spent in the room with the  patient discussing lipid lowering therapy.   ADDENDUM:  I received the patient's labs on November 27, 2007, and spoke  with the patient on November 30, 2007, at 5 o'clock.  We reviewed her labs,  which have revealed a glucose of 116, an improving SGPT now at 42, and  virtually unchanged lipid lowering labs from her last visit.  We  discussed the role of prediabetes in elevation of triglycerides.  I have  asked the patient to continue on her current therapy, to reduce her  concentrated sweet intake, and follow up with me in three months, sooner  with problems or issues in the meantime.  I have mailed a copy of  her  labs to her on [address] in [place], she will call with questions or  problems, and she has been asked to call to set up her followup in three  months.      Shelby Dubin, PharmD, BCPS, CPP  Electronically Signed      Madolyn Frieze. Jens Som, MD, [facility]  Electronically Signed   MP/MedQ  DD: 11/30/2007  DT: 11/30/2007  Job #: [id number]   cc:   Madolyn Frieze. Jens Som, MD, [facility]

## 2011-01-08 ENCOUNTER — Telehealth: Payer: Self-pay | Admitting: Cardiology

## 2011-01-08 HISTORY — PX: HERNIA REPAIR: SHX51

## 2011-01-08 NOTE — Telephone Encounter (Signed)
LOV,12 faxed to [facility] RN @ [phone]  01/08/11/km

## 2011-04-21 ENCOUNTER — Telehealth: Payer: Self-pay | Admitting: Gastroenterology

## 2011-04-21 NOTE — Telephone Encounter (Signed)
Pt states she is having abdominal pain, bloating and nausea. Requesting to be seen. Appt made for pt to see Amy Esterwood PA 04/22/11@8 :30am. Pt aware of appt date and time.

## 2011-04-22 ENCOUNTER — Encounter: Payer: Self-pay | Admitting: Physician Assistant

## 2011-04-22 ENCOUNTER — Ambulatory Visit (INDEPENDENT_AMBULATORY_CARE_PROVIDER_SITE_OTHER): Payer: Medicare Other | Admitting: Physician Assistant

## 2011-04-22 VITALS — BP 142/68 | HR 78 | Ht 63.0 in | Wt 164.0 lb

## 2011-04-22 DIAGNOSIS — R1013 Epigastric pain: Secondary | ICD-10-CM

## 2011-04-22 DIAGNOSIS — R110 Nausea: Secondary | ICD-10-CM

## 2011-04-22 DIAGNOSIS — R141 Gas pain: Secondary | ICD-10-CM

## 2011-04-22 MED ORDER — ONDANSETRON HCL 4 MG PO TABS: ORAL_TABLET | ORAL | Status: DC

## 2011-04-22 NOTE — Patient Instructions (Signed)
Stay on the Prilosec twice daily. Take Align, take 1 capsule daily for 1 month.  Samples and coupon given. We have scheduled the Endoscopy with Dr Arlyce Dice on 05-04-2011 at 1 PM. Directions given.   We have given you instructions for the Gastric Emptying scan, scheduled for 05-12-2011.

## 2011-04-23 ENCOUNTER — Ambulatory Visit: Payer: Medicare Other | Admitting: Physician Assistant

## 2011-04-26 ENCOUNTER — Encounter: Payer: Self-pay | Admitting: Physician Assistant

## 2011-04-26 NOTE — Progress Notes (Signed)
Subjective:    Patient ID: [name], female    DOB: Apr 07, 1946, 65 y.o.   MRN: [id number]  HPI [name] is a pleasant 65 year old white female with multiple medical problems including coronary artery disease. She had CABG in 2005, then a PC I. of the saphenous vein graft to the right coronary artery . She was admitted in October of 2011 with recurrent chest pain and found to have severe three-vessel coronary artery disease, her grafts were patent she did have a drug-eluting stent placed to the PDA. After that she elected medical management. She also has history of fatty liver disease, GERD, adult-onset diabetes , and depression.  She comes in today with complaints of ongoing nausea and upper abdominal discomfort which is associated with bloating and gas and achy abdominal discomfort. She did have a ventral hernia repaired 3 months ago at [facility] which did not help any of her other symptoms. She is most concerned because she says she stays nauseated all day long as she does not vomit. She has no current complaints of dysphagia or odynophagia him in no early satiety area She does mention that she gets shaky and diaphoretic if she does not eat every 3 hours, the does not have a definite diagnosis of diabetes at this time. She says she has a constant upper abdominal pain more on the right side into her back which has been ongoing. Her appetite is fine her weight has been stable. Her bowel movements have been normal without melena or hematochezia. She is not currently on any NSAIDs. She is frustrated because she said she never feels good anymore.  Colonoscopy was done in 2011 and was normal, upper endoscopy was done in 2010 with dilation of a distal esophageal stricture, [place].   Review of Systems  Constitutional: Positive for appetite change and fatigue.  HENT: Negative.   Eyes: Negative.   Respiratory: Negative.   Cardiovascular: Negative.   Gastrointestinal: Positive for nausea and  abdominal pain.  Genitourinary: Negative.   Musculoskeletal: Positive for arthralgias.  Skin: Negative.   Neurological: Negative.   Hematological: Negative.   Psychiatric/Behavioral: Negative.    Outpatient Prescriptions Prior to Visit  Medication Sig Dispense Refill  . ALPRAZolam (XANAX) 0.5 MG tablet Take 0.5 mg by mouth as needed.        Marland Kitchen aspirin 81 MG tablet Take 81 mg by mouth daily.        Marland Kitchen atenolol (TENORMIN) 50 MG tablet Take 50 mg by mouth. Take 1 1/2 tablets daily       . cloNIDine (CATAPRES) 0.2 MG tablet Take 0.2 mg by mouth 2 (two) times daily.        . clopidogrel (PLAVIX) 75 MG tablet Take 75 mg by mouth daily.        . fish oil-omega-3 fatty acids 1000 MG capsule Take 2 g by mouth. Take 2 tablets two times daily       . furosemide (LASIX) 40 MG tablet Take 40 mg by mouth daily.        Marland Kitchen gemfibrozil (LOPID) 600 MG tablet Take 600 mg by mouth 2 (two) times daily.        . nitroGLYCERIN (NITROSTAT) 0.4 MG SL tablet Place 0.4 mg under the tongue every 5 (five) minutes as needed.        Marland Kitchen olmesartan (BENICAR) 40 MG tablet 1/2 tab po qd       . omeprazole (PRILOSEC OTC) 20 MG tablet Take 20 mg by mouth daily.        Marland Kitchen  PARoxetine (PAXIL) 20 MG tablet Take 20 mg by mouth every morning.         No Known Allergies     Objective:   Physical Exam Well-developed white female in no acute distress, pleasant, alert and oriented x3, HEENT; nontraumatic normocephalic EOMI PERRLA sclera anicteric Neck; supple no JVD Cardiovascular; regular rate and rhythm with S1 and S2 soft systolic murmur, Pulmonary; clear bilaterally, Abdomen; soft minimally, tender across the upper abdomen there is no guarding, no rebound, no palpable mass or hepatosplenomegaly, bowel sounds are active, Recta;l not done Extremities; no clubbing, cyanosis, or edema skin, warm and dry Psych; affect somewhat flat, but otherwise appropriate        Assessment & Plan:  #33 65 year old female with chronic nausea and  upper abdominal discomfort of unclear etiology. Symptoms persist despite twice daily PPI therapy, question component of medication-induced symptoms, rule out gastropathy or peptic ulcer disease, rule out gastroparesis.  Plan; Schedule for upper endoscopy with Dr. Arlyce Dice, procedure discussed in detail with the patient, she will remain on Plavix. Continue Prilosec 20 mg by mouth twice daily Add a line one by mouth daily Trial of Zofran 4 mg every 6-8 hours as needed for nausea Schedule for gastric emptying scan  #2 Three-vessel coronary artery disease status post CABG, status post stent.  #3 Fatty liver disease.

## 2011-04-26 NOTE — Progress Notes (Signed)
Agree with initial assessment and plans 

## 2011-05-04 ENCOUNTER — Encounter: Payer: Self-pay | Admitting: Gastroenterology

## 2011-05-04 ENCOUNTER — Ambulatory Visit (AMBULATORY_SURGERY_CENTER): Payer: Medicare Other | Admitting: Gastroenterology

## 2011-05-04 DIAGNOSIS — R110 Nausea: Secondary | ICD-10-CM

## 2011-05-04 DIAGNOSIS — R141 Gas pain: Secondary | ICD-10-CM

## 2011-05-04 DIAGNOSIS — R1013 Epigastric pain: Secondary | ICD-10-CM

## 2011-05-04 MED ORDER — SODIUM CHLORIDE 0.9 % IV SOLN: 500.0000 mL | INTRAVENOUS | Status: DC

## 2011-05-04 NOTE — Patient Instructions (Signed)
Please review discharge instructions (green and blue sheets)  Resume your regular medications  Follow soft diet today

## 2011-05-05 ENCOUNTER — Other Ambulatory Visit: Payer: Self-pay | Admitting: Gastroenterology

## 2011-05-05 ENCOUNTER — Telehealth: Payer: Self-pay | Admitting: *Deleted

## 2011-05-05 MED ORDER — ALIGN 4 MG PO CAPS: 1.0000 | ORAL_CAPSULE | ORAL | Status: DC

## 2011-05-05 NOTE — Telephone Encounter (Signed)
No answer. No machine to leave message on.

## 2011-05-05 NOTE — Telephone Encounter (Signed)
Sent Script to pharmacy for [employer]

## 2011-05-12 ENCOUNTER — Telehealth: Payer: Self-pay | Admitting: *Deleted

## 2011-05-12 ENCOUNTER — Encounter (HOSPITAL_COMMUNITY)
Admission: RE | Admit: 2011-05-12 | Discharge: 2011-05-12 | Disposition: A | Discharge status: Home / Self Care | Payer: Medicare Other | Source: Ambulatory Visit | Attending: Physician Assistant | Admitting: Physician Assistant

## 2011-05-12 DIAGNOSIS — E119 Type 2 diabetes mellitus without complications: Secondary | ICD-10-CM | POA: Insufficient documentation

## 2011-05-12 DIAGNOSIS — I2510 Atherosclerotic heart disease of native coronary artery without angina pectoris: Secondary | ICD-10-CM | POA: Insufficient documentation

## 2011-05-12 DIAGNOSIS — I252 Old myocardial infarction: Secondary | ICD-10-CM | POA: Insufficient documentation

## 2011-05-12 DIAGNOSIS — R141 Gas pain: Secondary | ICD-10-CM | POA: Insufficient documentation

## 2011-05-12 DIAGNOSIS — R142 Eructation: Secondary | ICD-10-CM | POA: Insufficient documentation

## 2011-05-12 DIAGNOSIS — Z9089 Acquired absence of other organs: Secondary | ICD-10-CM | POA: Insufficient documentation

## 2011-05-12 DIAGNOSIS — I10 Essential (primary) hypertension: Secondary | ICD-10-CM | POA: Insufficient documentation

## 2011-05-12 DIAGNOSIS — R1013 Epigastric pain: Secondary | ICD-10-CM

## 2011-05-12 DIAGNOSIS — R110 Nausea: Secondary | ICD-10-CM | POA: Insufficient documentation

## 2011-05-12 DIAGNOSIS — R143 Flatulence: Secondary | ICD-10-CM

## 2011-05-12 DIAGNOSIS — R109 Unspecified abdominal pain: Secondary | ICD-10-CM | POA: Insufficient documentation

## 2011-05-12 MED ORDER — TECHNETIUM TC 99M SULFUR COLLOID
2.0000 | Freq: Once | INTRAVENOUS | Status: AC | PRN
  Administered 2011-05-12: 2 via INTRAVENOUS

## 2011-05-12 NOTE — Telephone Encounter (Signed)
Message copied by Daphine Deutscher on Wed May 12, 2011  2:10 PM ------      Message from: Vilas, Virginia S      Created: Wed May 12, 2011 11:18 AM       Please let pt know the gastric emptying study is normal.

## 2011-05-12 NOTE — Telephone Encounter (Signed)
Patient given results

## 2011-05-17 LAB — BASIC METABOLIC PANEL
BUN: 10
Calcium: 9.6
Creatinine, Ser: 0.78
GFR calc Af Amer: >60
GFR calc non Af Amer: >60

## 2011-05-17 LAB — CBC
MCV: 90.3
Platelets: 262
RBC: 4.01
WBC: 5.2

## 2011-05-17 LAB — URINALYSIS, ROUTINE W REFLEX MICROSCOPIC
Ketones, ur: NEGATIVE
Nitrite: NEGATIVE
Protein, ur: NEGATIVE
Urobilinogen, UA: 1

## 2011-05-17 LAB — DIFFERENTIAL
Basophils Relative: 0
Lymphocytes Relative: 13
Monocytes Absolute: 0.4
Monocytes Relative: 8
Neutro Abs: 3.9
Neutrophils Relative %: 76

## 2011-05-18 ENCOUNTER — Encounter: Payer: Self-pay | Admitting: Gastroenterology

## 2011-05-18 ENCOUNTER — Ambulatory Visit (INDEPENDENT_AMBULATORY_CARE_PROVIDER_SITE_OTHER): Payer: Medicare Other | Admitting: Gastroenterology

## 2011-05-18 VITALS — BP 132/78 | HR 67 | Ht 64.0 in | Wt 164.8 lb

## 2011-05-18 DIAGNOSIS — K3189 Other diseases of stomach and duodenum: Secondary | ICD-10-CM | POA: Insufficient documentation

## 2011-05-18 DIAGNOSIS — R1013 Epigastric pain: Secondary | ICD-10-CM

## 2011-05-18 NOTE — Patient Instructions (Signed)
We are giving you Amitiza samples today  Call us if you will need a prescription sent to your pharmacy

## 2011-05-18 NOTE — Assessment & Plan Note (Addendum)
Symptoms are not specific and could be do to a medication effect.  Recommendations #1 continue align #2 trial of amitiza 8 mcg twice a day

## 2011-05-18 NOTE — Progress Notes (Signed)
History of Present Illness:  [name] has returned for followup of dyspepsia. Upper endoscopy and gastric empty scan were normal. She feels slightly better which she attributes to align. Symptoms are nonetheless present.  There is been no change in her medicines. If she misses a dose of Prilosec she develops upper abdominal discomfort.    Review of Systems: Pertinent positive and negative review of systems were noted in the above HPI section. All other review of systems were otherwise negative.    Current Medications, Allergies, Past Medical History, Past Surgical History, Family History and Social History were reviewed in [employer] electronic medical record  Vital signs were reviewed in today's medical record. Physical Exam: General: Well developed , well nourished, no acute distress

## 2011-05-31 NOTE — Progress Notes (Signed)
HPI:[name] is a pleasant female who has a history of coronary artery disease status post coronary bypassing graft in 2005. She has had prior PCI of the saphenous vein graft to the right coronary artery. Admitted in early October of 2011 with recurrent chest pain. She was found to have severe three-vessel coronary artery disease. Her grafts were patent but there was a lesion in the saphenous vein graft to the PDA and she had a drug-eluting stent at that time. She was readmitted with recurrent atypical chest pain and ruled out. We discussed repeat catheterization versus medical therapy and she elected the latter. I last saw her in April of 2012. Since then, there is no dyspnea on exertion, orthopnea, PND or syncope. She occasionally feels indigestion and chest pain after eating. She does not have exertional chest pain.    Current Outpatient Prescriptions  Medication Sig Dispense Refill  . ALPRAZolam (XANAX) 0.5 MG tablet Take 0.5 mg by mouth as needed.        Marland Kitchen aspirin 81 MG tablet Take 81 mg by mouth daily.        Marland Kitchen atenolol (TENORMIN) 50 MG tablet Take 1 1/2 tablets daily      . cloNIDine (CATAPRES) 0.2 MG tablet Take 0.2 mg by mouth 2 (two) times daily.        . clopidogrel (PLAVIX) 75 MG tablet Take 75 mg by mouth daily.        . fish oil-omega-3 fatty acids 1000 MG capsule Take 2 tablets two times daily      . furosemide (LASIX) 40 MG tablet Take 40 mg by mouth daily.        Marland Kitchen gemfibrozil (LOPID) 600 MG tablet Take 600 mg by mouth 2 (two) times daily.        . hydrocodone-acetaminophen (LORCET-HD) 5-500 MG per capsule Take 1 capsule by mouth every 6 (six) hours as needed.        Marland Kitchen losartan (COZAAR) 50 MG tablet Take 50 mg by mouth daily.       . nitroGLYCERIN (NITROSTAT) 0.4 MG SL tablet Place 0.4 mg under the tongue every 5 (five) minutes as needed.        Marland Kitchen omeprazole (PRILOSEC OTC) 20 MG tablet Take 20 mg by mouth daily.        . ondansetron (ZOFRAN) 4 MG tablet every 8 (eight) hours as  needed. Take 1 tab each morning       . PARoxetine (PAXIL) 20 MG tablet Take 20 mg by mouth every morning.        . Probiotic Product (ALIGN) 4 MG CAPS Take 1 capsule by mouth 1 day or 1 dose.  30 capsule  6     Past Medical History  Diagnosis Date  . Hypertension   . Hyperlipidemia   . Coronary artery disease   . Acute myocardial infarction, unspecified site, episode of care unspecified   . Esophageal reflux   . Diabetes mellitus     Borderline  . GERD (gastroesophageal reflux disease)   . Depression   . History of cholecystectomy   . Hernia     Past Surgical History  Procedure Date  . Bypass graft 08/2003    Quadruple   . Abdominal hysterectomy   . Cholecystectomy   . Hernia repair 01/2011    History   Social History  . Marital Status: Married    Spouse Name: N/A    Number of Children: 2  . Years of Education: N/A  Occupational History  . Part-Time hair-dresser    Social History Main Topics  . Smoking status: Never Smoker   . Smokeless tobacco: Never Used  . Alcohol Use: No  . Drug Use: No  . Sexually Active:    Other Topics Concern  . Not on file   Social History Narrative  . No narrative on file    ROS: no fevers or chills, productive cough, hemoptysis, dysphasia, odynophagia, melena, hematochezia, dysuria, hematuria, rash, seizure activity, orthopnea, PND, pedal edema, claudication. Remaining systems are negative.  Physical Exam: Well-developed well-nourished in no acute distress.  Skin is warm and dry.  HEENT is normal.  Neck is supple. No thyromegaly.  Chest is clear to auscultation with normal expansion.  Cardiovascular exam is regular rate and rhythm.  Abdominal exam nontender or distended. No masses palpated. Extremities show no edema. neuro grossly intact  ECG NSR, nonspecific ST changes

## 2011-06-07 ENCOUNTER — Encounter: Payer: Self-pay | Admitting: *Deleted

## 2011-06-07 ENCOUNTER — Ambulatory Visit (INDEPENDENT_AMBULATORY_CARE_PROVIDER_SITE_OTHER): Payer: Medicare Other | Admitting: Cardiology

## 2011-06-07 ENCOUNTER — Encounter: Payer: Self-pay | Admitting: Cardiology

## 2011-06-07 DIAGNOSIS — E7800 Pure hypercholesterolemia, unspecified: Secondary | ICD-10-CM

## 2011-06-07 DIAGNOSIS — E785 Hyperlipidemia, unspecified: Secondary | ICD-10-CM

## 2011-06-07 DIAGNOSIS — R079 Chest pain, unspecified: Secondary | ICD-10-CM

## 2011-06-07 DIAGNOSIS — I10 Essential (primary) hypertension: Secondary | ICD-10-CM

## 2011-06-07 DIAGNOSIS — I2510 Atherosclerotic heart disease of native coronary artery without angina pectoris: Secondary | ICD-10-CM

## 2011-06-07 LAB — LIPID PANEL
Cholesterol: 201 mg/dL — ABNORMAL HIGH (ref 0–200)
Total CHOL/HDL Ratio: 5
Triglycerides: 297 mg/dL — ABNORMAL HIGH (ref 0.0–149.0)
VLDL: 59.4 mg/dL — ABNORMAL HIGH (ref 0.0–40.0)

## 2011-06-07 LAB — BASIC METABOLIC PANEL
BUN: 13 mg/dL (ref 6–23)
CO2: 29 mEq/L (ref 19–32)
Chloride: 104 mEq/L (ref 96–112)
Creatinine, Ser: 0.9 mg/dL (ref 0.4–1.2)
Potassium: 3.6 mEq/L (ref 3.5–5.1)

## 2011-06-07 LAB — HEPATIC FUNCTION PANEL
Albumin: 4.4 g/dL (ref 3.5–5.2)
Alkaline Phosphatase: 91 U/L (ref 39–117)
Total Bilirubin: 1.1 mg/dL (ref 0.3–1.2)

## 2011-06-07 LAB — LDL CHOLESTEROL, DIRECT: Direct LDL: 130.6 mg/dL

## 2011-06-07 NOTE — Patient Instructions (Signed)
Your physician wants you to follow-up in: 6 MONTHS You will receive a reminder letter in the mail two months in advance. If you don't receive a letter, please call our office to schedule the follow-up appointment.   Your physician recommends that you return for lab work in: TODAY  

## 2011-06-07 NOTE — Assessment & Plan Note (Signed)
Symptoms most likely GI related. Continue present medications.

## 2011-06-07 NOTE — Assessment & Plan Note (Signed)
Continue present medications. Check lipids and liver. Not on a statin given history of elevated liver functions.

## 2011-06-07 NOTE — Assessment & Plan Note (Signed)
Blood pressure controlled. Continue present medications. Check potassium and renal function. 

## 2011-06-07 NOTE — Assessment & Plan Note (Signed)
Continue aspirin, Plavix and beta blocker. Not on a statin given history of elevated liver functions.

## 2011-06-10 ENCOUNTER — Telehealth: Payer: Self-pay | Admitting: Cardiology

## 2011-06-10 NOTE — Telephone Encounter (Signed)
New message:  Pt had lab on Monday, would like results.

## 2011-06-10 NOTE — Telephone Encounter (Signed)
Spoke with pt, aware of test results [name]  

## 2011-08-16 DIAGNOSIS — B3789 Other sites of candidiasis: Secondary | ICD-10-CM | POA: Diagnosis not present

## 2011-08-16 DIAGNOSIS — R1084 Generalized abdominal pain: Secondary | ICD-10-CM | POA: Diagnosis not present

## 2011-08-16 DIAGNOSIS — H6500 Acute serous otitis media, unspecified ear: Secondary | ICD-10-CM | POA: Diagnosis not present

## 2011-08-16 DIAGNOSIS — M545 Low back pain: Secondary | ICD-10-CM | POA: Diagnosis not present

## 2011-09-27 ENCOUNTER — Other Ambulatory Visit: Payer: Self-pay | Admitting: Cardiology

## 2011-09-27 DIAGNOSIS — R7309 Other abnormal glucose: Secondary | ICD-10-CM | POA: Diagnosis not present

## 2011-09-27 DIAGNOSIS — R81 Glycosuria: Secondary | ICD-10-CM | POA: Diagnosis not present

## 2011-09-27 DIAGNOSIS — E119 Type 2 diabetes mellitus without complications: Secondary | ICD-10-CM | POA: Diagnosis not present

## 2011-09-27 DIAGNOSIS — R110 Nausea: Secondary | ICD-10-CM | POA: Diagnosis not present

## 2011-09-27 DIAGNOSIS — M545 Low back pain: Secondary | ICD-10-CM | POA: Diagnosis not present

## 2011-09-27 DIAGNOSIS — R1084 Generalized abdominal pain: Secondary | ICD-10-CM | POA: Diagnosis not present

## 2011-09-27 DIAGNOSIS — I10 Essential (primary) hypertension: Secondary | ICD-10-CM | POA: Diagnosis not present

## 2011-09-29 DIAGNOSIS — R1011 Right upper quadrant pain: Secondary | ICD-10-CM | POA: Diagnosis not present

## 2011-10-11 DIAGNOSIS — F329 Major depressive disorder, single episode, unspecified: Secondary | ICD-10-CM | POA: Diagnosis not present

## 2011-10-11 DIAGNOSIS — M545 Low back pain: Secondary | ICD-10-CM | POA: Diagnosis not present

## 2011-10-11 DIAGNOSIS — K219 Gastro-esophageal reflux disease without esophagitis: Secondary | ICD-10-CM | POA: Diagnosis not present

## 2011-10-11 DIAGNOSIS — R1011 Right upper quadrant pain: Secondary | ICD-10-CM | POA: Diagnosis not present

## 2011-10-11 DIAGNOSIS — I10 Essential (primary) hypertension: Secondary | ICD-10-CM | POA: Diagnosis not present

## 2011-10-11 DIAGNOSIS — E782 Mixed hyperlipidemia: Secondary | ICD-10-CM | POA: Diagnosis not present

## 2011-10-28 ENCOUNTER — Ambulatory Visit: Payer: Medicare Other | Admitting: Gastroenterology

## 2012-01-04 DIAGNOSIS — K219 Gastro-esophageal reflux disease without esophagitis: Secondary | ICD-10-CM | POA: Diagnosis not present

## 2012-01-04 DIAGNOSIS — IMO0001 Reserved for inherently not codable concepts without codable children: Secondary | ICD-10-CM | POA: Diagnosis not present

## 2012-01-04 DIAGNOSIS — E782 Mixed hyperlipidemia: Secondary | ICD-10-CM | POA: Diagnosis not present

## 2012-01-04 DIAGNOSIS — M545 Low back pain: Secondary | ICD-10-CM | POA: Diagnosis not present

## 2012-02-28 ENCOUNTER — Ambulatory Visit (INDEPENDENT_AMBULATORY_CARE_PROVIDER_SITE_OTHER): Payer: Medicare Other | Admitting: Cardiology

## 2012-02-28 ENCOUNTER — Encounter: Payer: Self-pay | Admitting: Cardiology

## 2012-02-28 VITALS — BP 172/82 | HR 60 | Ht 62.0 in | Wt 152.0 lb

## 2012-02-28 DIAGNOSIS — I10 Essential (primary) hypertension: Secondary | ICD-10-CM | POA: Diagnosis not present

## 2012-02-28 DIAGNOSIS — I2510 Atherosclerotic heart disease of native coronary artery without angina pectoris: Secondary | ICD-10-CM | POA: Diagnosis not present

## 2012-02-28 DIAGNOSIS — E785 Hyperlipidemia, unspecified: Secondary | ICD-10-CM | POA: Diagnosis not present

## 2012-02-28 NOTE — Patient Instructions (Signed)
Your physician wants you to follow-up in: 9 MONTHS WITH DR Jens Som  You will receive a reminder letter in the mail two months in advance. If you don't receive a letter, please call our office to schedule the follow-up appointment. Your physician recommends that you continue on your current medications as directed. Please refer to the Current Medication list given to you today.

## 2012-02-28 NOTE — Assessment & Plan Note (Signed)
Continue Plavix. She is not on aspirin as it has caused problems with her GI tract previously. Not on a statin because of increased liver functions previously.

## 2012-02-28 NOTE — Progress Notes (Signed)
HPI: [name] is a pleasant female who has a history of coronary artery disease status post coronary bypassing graft in 2005. She has had prior PCI of the saphenous vein graft to the right coronary artery. Admitted in early October of 2011 with recurrent chest pain. She was found to have severe three-vessel coronary artery disease. Her grafts were patent but there was a lesion in the saphenous vein graft to the PDA and she had a drug-eluting stent at that time. She was readmitted with recurrent atypical chest pain and ruled out. We discussed repeat catheterization versus medical therapy and she elected the latter. I last saw her in Oct of 2012. Since then, there is no dyspnea on exertion, orthopnea, PND or syncope. She occasionally feels indigestion and chest pain after eating. She does not have exertional chest pain.    Current Outpatient Prescriptions  Medication Sig Dispense Refill  . ALPRAZolam (XANAX) 0.5 MG tablet Take 0.5 mg by mouth as needed.        Marland Kitchen aspirin 81 MG tablet Take 81 mg by mouth daily.        Marland Kitchen atenolol (TENORMIN) 50 MG tablet Take 1 1/2 tablets daily      . cloNIDine (CATAPRES) 0.2 MG tablet TAKE ONE-HALF TO ONE TABLET BY MOUTH TWICE DAILY  60 tablet  12  . clopidogrel (PLAVIX) 75 MG tablet Take 75 mg by mouth daily.        . fish oil-omega-3 fatty acids 1000 MG capsule Take 2 tablets two times daily      . furosemide (LASIX) 40 MG tablet Take 40 mg by mouth daily.        Marland Kitchen gemfibrozil (LOPID) 600 MG tablet Take 600 mg by mouth 2 (two) times daily.        . hydrocodone-acetaminophen (LORCET-HD) 5-500 MG per capsule Take 1 capsule by mouth every 6 (six) hours as needed.        . metFORMIN (GLUCOPHAGE) 500 MG tablet Take 1 tablet by mouth Daily.      . nitroGLYCERIN (NITROSTAT) 0.4 MG SL tablet Place 0.4 mg under the tongue every 5 (five) minutes as needed.        Marland Kitchen omeprazole (PRILOSEC OTC) 20 MG tablet Take 20 mg by mouth daily.        Marland Kitchen PARoxetine (PAXIL) 20 MG tablet  Take 20 mg by mouth every morning.           Past Medical History  Diagnosis Date  . Hypertension   . Hyperlipidemia   . Coronary artery disease   . Acute myocardial infarction, unspecified site, episode of care unspecified   . Esophageal reflux   . Diabetes mellitus     Borderline  . GERD (gastroesophageal reflux disease)   . Depression   . History of cholecystectomy   . Hernia     Past Surgical History  Procedure Date  . Bypass graft 08/2003    Quadruple   . Abdominal hysterectomy   . Cholecystectomy   . Hernia repair 01/2011    History   Social History  . Marital Status: Married    Spouse Name: N/A    Number of Children: 2  . Years of Education: N/A   Occupational History  . Part-Time hair-dresser    Social History Main Topics  . Smoking status: Never Smoker   . Smokeless tobacco: Never Used  . Alcohol Use: No  . Drug Use: No  . Sexually Active:    Other Topics  Concern  . Not on file   Social History Narrative  . No narrative on file    ROS: Some back pain but no fevers or chills, productive cough, hemoptysis, dysphasia, odynophagia, melena, hematochezia, dysuria, hematuria, rash, seizure activity, orthopnea, PND, pedal edema, claudication. Remaining systems are negative.  Physical Exam: Well-developed well-nourished in no acute distress.  Skin is warm and dry.  HEENT is normal.  Neck is supple.  Chest is clear to auscultation with normal expansion.  Cardiovascular exam is regular rate and rhythm.  Abdominal exam nontender or distended. No masses palpated. Extremities show no edema. neuro grossly intact  ECG sinus rhythm at a rate of 60. Axis normal. Prolonged QT interval.

## 2012-02-28 NOTE — Assessment & Plan Note (Signed)
Blood pressure is mildly elevated. However she follows this closely at home and it is typically controlled. She will continue to follow and we will increase medications if needed. Potassium and renal function monitored by primary care.

## 2012-02-28 NOTE — Assessment & Plan Note (Signed)
Continue present medications. Not on a statin because of increased liver functions.

## 2012-04-04 ENCOUNTER — Emergency Department (HOSPITAL_COMMUNITY): Payer: Medicare Other

## 2012-04-04 ENCOUNTER — Inpatient Hospital Stay (HOSPITAL_COMMUNITY)
Admission: EM | Admit: 2012-04-04 | Discharge: 2012-04-05 | DRG: 287 | Disposition: A | Discharge status: Home / Self Care | Payer: Medicare Other | Attending: Cardiology | Admitting: Cardiology

## 2012-04-04 ENCOUNTER — Encounter (HOSPITAL_COMMUNITY): Payer: Self-pay | Admitting: Emergency Medicine

## 2012-04-04 DIAGNOSIS — R0789 Other chest pain: Principal | ICD-10-CM | POA: Diagnosis present

## 2012-04-04 DIAGNOSIS — R7401 Elevation of levels of liver transaminase levels: Secondary | ICD-10-CM | POA: Diagnosis present

## 2012-04-04 DIAGNOSIS — K219 Gastro-esophageal reflux disease without esophagitis: Secondary | ICD-10-CM | POA: Diagnosis present

## 2012-04-04 DIAGNOSIS — E785 Hyperlipidemia, unspecified: Secondary | ICD-10-CM | POA: Diagnosis not present

## 2012-04-04 DIAGNOSIS — Z951 Presence of aortocoronary bypass graft: Secondary | ICD-10-CM | POA: Diagnosis not present

## 2012-04-04 DIAGNOSIS — I252 Old myocardial infarction: Secondary | ICD-10-CM

## 2012-04-04 DIAGNOSIS — E119 Type 2 diabetes mellitus without complications: Secondary | ICD-10-CM | POA: Diagnosis present

## 2012-04-04 DIAGNOSIS — I2510 Atherosclerotic heart disease of native coronary artery without angina pectoris: Secondary | ICD-10-CM | POA: Diagnosis present

## 2012-04-04 DIAGNOSIS — I10 Essential (primary) hypertension: Secondary | ICD-10-CM | POA: Diagnosis not present

## 2012-04-04 DIAGNOSIS — Z9861 Coronary angioplasty status: Secondary | ICD-10-CM

## 2012-04-04 DIAGNOSIS — R079 Chest pain, unspecified: Secondary | ICD-10-CM | POA: Diagnosis present

## 2012-04-04 DIAGNOSIS — R7402 Elevation of levels of lactic acid dehydrogenase (LDH): Secondary | ICD-10-CM | POA: Diagnosis not present

## 2012-04-04 HISTORY — DX: Elevation of levels of liver transaminase levels: R74.01

## 2012-04-04 HISTORY — DX: Nonspecific elevation of levels of transaminase and lactic acid dehydrogenase (ldh): R74.0

## 2012-04-04 LAB — BASIC METABOLIC PANEL
BUN: 13 mg/dL (ref 6–23)
CO2: 28 mEq/L (ref 19–32)
Chloride: 101 mEq/L (ref 96–112)
Glucose, Bld: 113 mg/dL — ABNORMAL HIGH (ref 70–99)
Potassium: 3.5 mEq/L (ref 3.5–5.1)
Sodium: 140 mEq/L (ref 135–145)

## 2012-04-04 LAB — CARDIAC PANEL(CRET KIN+CKTOT+MB+TROPI)
CK, MB: 1.9 ng/mL (ref 0.3–4.0)
Relative Index: INVALID (ref 0.0–2.5)
Total CK: 54 U/L (ref 7–177)
Troponin I: <0.3 ng/mL (ref <0.30)

## 2012-04-04 LAB — PROTIME-INR: INR: 1.03 (ref 0.00–1.49)

## 2012-04-04 LAB — POCT I-STAT TROPONIN I: Troponin i, poc: 0 ng/mL (ref 0.00–0.08)

## 2012-04-04 LAB — GLUCOSE, CAPILLARY: Glucose-Capillary: 185 mg/dL — ABNORMAL HIGH (ref 70–99)

## 2012-04-04 LAB — CBC
HCT: 35.8 % — ABNORMAL LOW (ref 36.0–46.0)
Hemoglobin: 12.8 g/dL (ref 12.0–15.0)
RBC: 3.95 MIL/uL (ref 3.87–5.11)

## 2012-04-04 MED ORDER — CLONIDINE HCL 0.1 MG PO TABS
0.1000 mg | ORAL_TABLET | Freq: Two times a day (BID) | ORAL | Status: DC
  Administered 2012-04-04 – 2012-04-05 (×2): 0.1 mg via ORAL
  Filled 2012-04-04 (×3): qty 1

## 2012-04-04 MED ORDER — ATENOLOL 50 MG PO TABS
75.0000 mg | ORAL_TABLET | Freq: Every day | ORAL | Status: DC
  Administered 2012-04-05: 75 mg via ORAL
  Filled 2012-04-04: qty 1

## 2012-04-04 MED ORDER — HYDROCODONE-ACETAMINOPHEN 5-325 MG PO TABS: 1.0000 | ORAL_TABLET | Freq: Four times a day (QID) | ORAL | Status: DC | PRN

## 2012-04-04 MED ORDER — GEMFIBROZIL 600 MG PO TABS
600.0000 mg | ORAL_TABLET | Freq: Two times a day (BID) | ORAL | Status: DC
  Administered 2012-04-05: 600 mg via ORAL
  Filled 2012-04-04 (×3): qty 1

## 2012-04-04 MED ORDER — NITROGLYCERIN 0.4 MG SL SUBL: 0.4000 mg | SUBLINGUAL_TABLET | SUBLINGUAL | Status: DC | PRN

## 2012-04-04 MED ORDER — OMEGA-3-ACID ETHYL ESTERS 1 G PO CAPS
2.0000 g | ORAL_CAPSULE | Freq: Two times a day (BID) | ORAL | Status: DC
  Administered 2012-04-04 – 2012-04-05 (×2): 2 g via ORAL
  Filled 2012-04-04 (×3): qty 2

## 2012-04-04 MED ORDER — SODIUM CHLORIDE 0.9 % IJ SOLN: 3.0000 mL | Freq: Two times a day (BID) | INTRAMUSCULAR | Status: DC

## 2012-04-04 MED ORDER — OMEPRAZOLE MAGNESIUM 20 MG PO TBEC: 20.0000 mg | DELAYED_RELEASE_TABLET | Freq: Every day | ORAL | Status: DC

## 2012-04-04 MED ORDER — ALPRAZOLAM 0.5 MG PO TABS: 0.5000 mg | ORAL_TABLET | Freq: Three times a day (TID) | ORAL | Status: DC | PRN

## 2012-04-04 MED ORDER — CLONIDINE HCL 0.1 MG PO TABS
0.1000 mg | ORAL_TABLET | Freq: Two times a day (BID) | ORAL | Status: DC
  Filled 2012-04-04: qty 2

## 2012-04-04 MED ORDER — SODIUM CHLORIDE 0.9 % IJ SOLN: 3.0000 mL | INTRAMUSCULAR | Status: DC | PRN

## 2012-04-04 MED ORDER — METFORMIN HCL 500 MG PO TABS
500.0000 mg | ORAL_TABLET | Freq: Every day | ORAL | Status: DC
  Filled 2012-04-04 (×2): qty 1

## 2012-04-04 MED ORDER — NITROGLYCERIN IN D5W 200-5 MCG/ML-% IV SOLN: 3.0000 ug/min | INTRAVENOUS | Status: DC

## 2012-04-04 MED ORDER — ASPIRIN 81 MG PO TABS: 81.0000 mg | ORAL_TABLET | Freq: Every day | ORAL | Status: DC

## 2012-04-04 MED ORDER — NITROGLYCERIN IN D5W 200-5 MCG/ML-% IV SOLN
5.0000 ug/min | INTRAVENOUS | Status: DC
  Administered 2012-04-04: 5 ug/min via INTRAVENOUS
  Filled 2012-04-04: qty 250

## 2012-04-04 MED ORDER — FUROSEMIDE 40 MG PO TABS
40.0000 mg | ORAL_TABLET | Freq: Every day | ORAL | Status: DC
  Filled 2012-04-04: qty 1

## 2012-04-04 MED ORDER — NITROGLYCERIN 0.4 MG SL SUBL
0.4000 mg | SUBLINGUAL_TABLET | SUBLINGUAL | Status: DC | PRN
  Administered 2012-04-04 (×2): 0.4 mg via SUBLINGUAL

## 2012-04-04 MED ORDER — HEPARIN BOLUS VIA INFUSION
4000.0000 [IU] | Freq: Once | INTRAVENOUS | Status: AC
  Administered 2012-04-04: 4000 [IU] via INTRAVENOUS
  Filled 2012-04-04: qty 4000

## 2012-04-04 MED ORDER — HEPARIN (PORCINE) IN NACL 100-0.45 UNIT/ML-% IJ SOLN
1000.0000 [IU]/h | INTRAMUSCULAR | Status: DC
  Administered 2012-04-04: 800 [IU]/h via INTRAVENOUS
  Administered 2012-04-05: 1000 [IU]/h via INTRAVENOUS
  Filled 2012-04-04 (×2): qty 250

## 2012-04-04 MED ORDER — ACETAMINOPHEN 325 MG PO TABS: 650.0000 mg | ORAL_TABLET | ORAL | Status: DC | PRN

## 2012-04-04 MED ORDER — PANTOPRAZOLE SODIUM 40 MG PO TBEC: 40.0000 mg | DELAYED_RELEASE_TABLET | Freq: Every day | ORAL | Status: DC

## 2012-04-04 MED ORDER — HYDROCODONE-ACETAMINOPHEN 5-500 MG PO CAPS: 1.0000 | ORAL_CAPSULE | Freq: Four times a day (QID) | ORAL | Status: DC | PRN

## 2012-04-04 MED ORDER — CLOPIDOGREL BISULFATE 75 MG PO TABS
75.0000 mg | ORAL_TABLET | Freq: Every day | ORAL | Status: DC
  Administered 2012-04-05: 75 mg via ORAL
  Filled 2012-04-04 (×2): qty 1

## 2012-04-04 MED ORDER — OMEGA-3 FATTY ACIDS 1000 MG PO CAPS: 2.0000 g | ORAL_CAPSULE | Freq: Two times a day (BID) | ORAL | Status: DC

## 2012-04-04 MED ORDER — PAROXETINE HCL 20 MG PO TABS
20.0000 mg | ORAL_TABLET | Freq: Every day | ORAL | Status: DC
  Administered 2012-04-05: 20 mg via ORAL
  Filled 2012-04-04: qty 1

## 2012-04-04 MED ORDER — ONDANSETRON HCL 4 MG/2ML IJ SOLN: 4.0000 mg | Freq: Four times a day (QID) | INTRAMUSCULAR | Status: DC | PRN

## 2012-04-04 MED ORDER — ASPIRIN EC 81 MG PO TBEC
81.0000 mg | DELAYED_RELEASE_TABLET | Freq: Every day | ORAL | Status: DC
  Filled 2012-04-04: qty 1

## 2012-04-04 MED ORDER — SODIUM CHLORIDE 0.9 % IV SOLN: 250.0000 mL | INTRAVENOUS | Status: DC | PRN

## 2012-04-04 MED ORDER — ASPIRIN EC 81 MG PO TBEC: 81.0000 mg | DELAYED_RELEASE_TABLET | Freq: Every day | ORAL | Status: DC

## 2012-04-04 NOTE — Progress Notes (Signed)
ANTICOAGULATION CONSULT NOTE - Initial Consult  Pharmacy Consult for Heparin  Indication: chest pain/ACS  No Known Allergies  Patient Measurements: Height: 5\' 3"  (160 cm) Weight: 152 lb (68.947 kg) IBW/kg (Calculated) : 52.4  Heparin Dosing Weight: 68kg  Vital Signs: Temp: 98.3 F (36.8 C) (08/27 1501) Temp src: Oral (08/27 1501) BP: 148/75 mmHg (08/27 2000) Pulse Rate: 74  (08/27 2000)  Labs:  Basename 04/04/12 1507  HGB 12.8  HCT 35.8*  PLT 208  APTT --  LABPROT 13.7  INR 1.03  HEPARINUNFRC --  CREATININE 0.81  CKTOTAL --  CKMB --  TROPONINI --    Estimated Creatinine Clearance: 63.6 ml/min (by C-G formula based on Cr of 0.81).   Medical History: Past Medical History  Diagnosis Date  . Hypertension   . Hyperlipidemia   . Coronary artery disease status post CABG 2005     Stenting of SVG to PDA  . Acute myocardial infarction, unspecified site, episode of care unspecified   . Esophageal reflux   . Diabetes mellitus   . GERD (gastroesophageal reflux disease)   . Depression   . History of cholecystectomy   . Hernia   . Elevated transaminase level     Most recently October 2012 most recent     Assessment: 51 yof with hx of CAD/CABG and PCI to SVG in 2011.  She is a newly Dx DM as well.  She presents to ED with CP with exertion and radiation down L arm. She states compliance with plavix PTA but is on no other anticoagulants PTA.  CBC stable, INR 1.03 and normal renal fx CrCl approx 53ml/min.  Goal of Therapy:  Heparin level 0.3-0.7 units/ml Monitor platelets by anticoagulation protocol: Yes   Plan:  Heparin bolus 4000 uts IV x1 Heparin drip 800 uts/hr Daily HL and CBC  Marcelino Scot 04/04/2012,8:15 PM

## 2012-04-04 NOTE — ED Notes (Signed)
Pt c/o midsternal CP with radiation to left arm and fatigue x 3 days

## 2012-04-04 NOTE — H&P (Signed)
History and Physical  Patient ID: [name] MRN: [id number], SOB: [birth year] 66 y.o. Date of Encounter: 04/04/2012, 7:16 PM  Primary Physician: Juliette Alcide, MD Primary Cardiologist: [facility]  Chief Complaint: chest pain  History of Present Illness: [name] is a 66 y.o. female with CAD and prior bypass surgery in 2005 with subsequent PCI other SVG to RCA with drug-eluting stenting in 2011.  She's had recurrent admissions for chest pain and apparently discussions were had regarding repeat catheterization at that time versus medical therapy and the latter was pursued. She was seen last by Dr. Jens Som July 2013 after interval of about 9 months during which she had very few episodes of chest discomfort.  She comes to the hospital today because of worsening of their chronic chest pain syndrome. This is characterized by a disc comfort midsternum with radiation into the left arm and associated with back discomfort. It has been relieved by rest and has been over the last couple of days triggered by exertion which is not normal. She took nitroglycerin today with relief on 2 occasions. She has received nitroglycerin again in the emergency room with relief. Her echocardiogram is notable for lateral ST segment depression of about 0.5 mm and flattening. This is new from 05/20/2010 and intermediate from July 2013.  She does not take statins because of chronic liver abnormalities with AST ALT elevations 2-3 fold most recently checked in October 2012.  She does take Gemfibrizol  for hypertriglyceridemia  She has a history of syncope that occurred post micturition about a year and a half ago. She has occasional flutters or not typically associated with significant symptoms  Past Medical History  Diagnosis Date  . Hypertension   . Hyperlipidemia   . Coronary artery disease status post CABG 2005     Stenting of SVG to PDA  . Acute myocardial infarction, unspecified site, episode of care  unspecified   . Esophageal reflux   . Diabetes mellitus   . GERD (gastroesophageal reflux disease)   . Depression   . History of cholecystectomy   . Hernia   . Elevated transaminase level     Most recently October 2012 most recent     Past Surgical History  Procedure Date  . Bypass graft 08/2003    Quadruple   . Abdominal hysterectomy   . Cholecystectomy   . Hernia repair 01/2011      Current Facility-Administered Medications  Medication Dose Route Frequency Provider Last Rate Last Dose  . nitroGLYCERIN (NITROSTAT) SL tablet 0.4 mg  0.4 mg Sublingual Q5 min PRN Charles B. Bernette Mayers, MD   0.4 mg at 04/04/12 7829   Current Outpatient Prescriptions  Medication Sig Dispense Refill  . ALPRAZolam (XANAX) 0.5 MG tablet Take 0.5 mg by mouth 3 (three) times daily as needed. For anxiety      . aspirin 81 MG tablet Take 81 mg by mouth daily.        Marland Kitchen atenolol (TENORMIN) 50 MG tablet Take 75 mg by mouth daily.       . cloNIDine (CATAPRES) 0.2 MG tablet Take 0.1-0.2 mg by mouth 2 (two) times daily.      . clopidogrel (PLAVIX) 75 MG tablet Take 75 mg by mouth daily.        . fish oil-omega-3 fatty acids 1000 MG capsule Take 2 g by mouth 2 (two) times daily.       . furosemide (LASIX) 40 MG tablet Take 40 mg by mouth daily.        Marland Kitchen  gemfibrozil (LOPID) 600 MG tablet Take 600 mg by mouth 2 (two) times daily.        . hydrocodone-acetaminophen (LORCET-HD) 5-500 MG per capsule Take 1 capsule by mouth every 6 (six) hours as needed. For pain      . metFORMIN (GLUCOPHAGE) 500 MG tablet Take 1 tablet by mouth daily with breakfast.       . nitroGLYCERIN (NITROSTAT) 0.4 MG SL tablet Place 0.4 mg under the tongue every 5 (five) minutes as needed. For chest pain      . omeprazole (PRILOSEC OTC) 20 MG tablet Take 20 mg by mouth daily.        Marland Kitchen PARoxetine (PAXIL) 20 MG tablet Take 20 mg by mouth every morning.        Marland Kitchen DISCONTD: cloNIDine (CATAPRES) 0.2 MG tablet TAKE ONE-HALF TO ONE TABLET BY MOUTH TWICE  DAILY  60 tablet  12     Allergies: No Known Allergies   History  Substance Use Topics  . Smoking status: Never Smoker   . Smokeless tobacco: Never Used  . Alcohol Use: No      Family History  Problem Relation Age of Onset  . Cancer Mother     stomach  . Diabetes Mother   . Heart attack Brother   . Addison's disease Sister       ROS:  Please see the history of present illness.   Negative except  pancreas cysts.  All other systems reviewed and negative.   Vital Signs: Blood pressure 146/73, pulse 68, temperature 98.3 F (36.8 C), temperature source Oral, resp. rate 16, SpO2 100.00%.  PHYSICAL EXAM: General:  Well nourished, well developed female in no acute distress HEENT: normal Lymph: no adenopathy Neck: no JVD Endocrine:  No thryomegaly Vascular: No carotid bruits; FA pulses 2+ bilaterally without bruits Cardiac:  normal S1, S2; RRR; no murmur Back: without kyphosis/scoliosis, no CVA tenderness Lungs:  clear to auscultation bilaterally, no wheezing, rhonchi or rales Abd: soft, nontender, no hepatomegaly Ext: no edema Musculoskeletal:  No deformities, BUE and BLE strength normal and equal Skin: warm and dry Neuro:  CNs 2-12 intact, no focal abnormalities noted Psych:  Normal affect   EKG:  Sinus rhythm at 65 Intervals 15/09/45 Axis XIII ST segment depression 0.5 mm lead 1L V5 and V6 with ST segment flattening  Labs:   Lab Results  Component Value Date   WBC 4.3 04/04/2012   HGB 12.8 04/04/2012   HCT 35.8* 04/04/2012   MCV 90.6 04/04/2012   PLT 208 04/04/2012     Lab 04/04/12 1507  NA 140  K 3.5  CL 101  CO2 28  BUN 13  CREATININE 0.81  CALCIUM 10.2  PROT --  BILITOT --  ALKPHOS --  ALT --  AST --  GLUCOSE 113*   No results found for this basename: CKTOTAL:4,CKMB:4,TROPONINI:4 in the last 72 hours Lab Results  Component Value Date   CHOL 201* 06/07/2011   HDL 36.70* 06/07/2011   LDLCALC  Value: 127        Total Cholesterol/HDL:CHD Risk  Coronary Heart Disease Risk Table                     Men   Women  1/2 Average Risk   3.4   3.3  Average Risk       5.0   4.4  2 X Average Risk   9.6   7.1  3 X Average Risk  23.4  11.0        Use the calculated Patient Ratio above and the CHD Risk Table to determine the patient's CHD Risk.        ATP III CLASSIFICATION (LDL):  <100     mg/dL   Optimal  409-811  mg/dL   Near or Above                    Optimal  130-159  mg/dL   Borderline  914-782  mg/dL   High  >956     mg/dL   Very High* 21/10/863   TRIG 297.0* 06/07/2011   No results found for this basename: DDIMER   BNP No results found for this basename: probnp       ASSESSMENT AND PLAN:   Patient Active Hospital Problem List: CHEST PAIN (01/25/2009)   CAD (01/25/2009)   Elevated transaminase level ()   HYPERLIPIDEMIA (01/25/2009)   GERD (09/03/2008)   The patient presents with worsening chest pain relieved by nitroglycerin. She has known significant coronary artery disease as well as reflux both of which could present as above. She has minor ST segment abnormalities supportive of the former diagnosis. Her pretest probability is also high. We will thus admit her, serial enzymes, intravenous heparin and nitroglycerin and I will defer to Dr. Jens Som in the morning decisions regarding catheterization versus stress testing.  She also has ongoing hyperlipidemia. She has had problems with hepatitis in the past which is precluded the use of statin therapy. I wonder if she is not a candidate for cholestyramine. I will defer this to Dr. Jens Som.  She is NPO and on cath board but without orders

## 2012-04-04 NOTE — ED Notes (Signed)
Diet tray ordered 

## 2012-04-04 NOTE — ED Notes (Signed)
Pt states shes had intermittent chest pain ("aching pain") for several days, with pain radiating down left arm and through to back. Pain is associated with weakness. Pt denies diaphoresis, n/v, sob. Pt states she is also having intermittent headaches. Pt states she took 2 nitro today which did relieve pain at time. Currently CP is a 6/10.

## 2012-04-04 NOTE — ED Notes (Signed)
MD at bedside. 

## 2012-04-04 NOTE — ED Provider Notes (Signed)
History     CSN: [id number]  Arrival date & time 04/04/12  1458   First MD Initiated Contact with Patient 04/04/12 1738      Chief Complaint  Patient presents with  . Chest Pain    (Consider location/radiation/quality/duration/timing/severity/associated sxs/prior treatment) HPI Pt with complex cardiac history reports she has had intermittent chest pain off and on for the last 2-3 days, initially improved with NTG. She has had no SOB, but pain radiates into her L arm and back.  Past Medical History  Diagnosis Date  . Hypertension   . Hyperlipidemia   . Coronary artery disease   . Acute myocardial infarction, unspecified site, episode of care unspecified   . Esophageal reflux   . Diabetes mellitus     Borderline  . GERD (gastroesophageal reflux disease)   . Depression   . History of cholecystectomy   . Hernia     Past Surgical History  Procedure Date  . Bypass graft 08/2003    Quadruple   . Abdominal hysterectomy   . Cholecystectomy   . Hernia repair 01/2011    Family History  Problem Relation Age of Onset  . Cancer Mother     stomach  . Diabetes Mother   . Heart attack Brother   . Addison's disease Sister     History  Substance Use Topics  . Smoking status: Never Smoker   . Smokeless tobacco: Never Used  . Alcohol Use: No    OB History    Grav Para Term Preterm Abortions TAB SAB Ect Mult Living                  Review of Systems All other systems reviewed and are negative except as noted in HPI.   Allergies  Review of patient's allergies indicates no known allergies.  Home Medications   Current Outpatient Rx  Name Route Sig Dispense Refill  . ALPRAZOLAM 0.5 MG PO TABS Oral Take 0.5 mg by mouth 3 (three) times daily as needed. For anxiety    . ASPIRIN 81 MG PO TABS Oral Take 81 mg by mouth daily.      . ATENOLOL 50 MG PO TABS Oral Take 75 mg by mouth daily.     Marland Kitchen CLONIDINE HCL 0.2 MG PO TABS Oral Take 0.1-0.2 mg by mouth 2 (two) times daily.      Marland Kitchen CLOPIDOGREL BISULFATE 75 MG PO TABS Oral Take 75 mg by mouth daily.      . OMEGA-3 FATTY ACIDS 1000 MG PO CAPS Oral Take 2 g by mouth 2 (two) times daily.     . FUROSEMIDE 40 MG PO TABS Oral Take 40 mg by mouth daily.      Marland Kitchen GEMFIBROZIL 600 MG PO TABS Oral Take 600 mg by mouth 2 (two) times daily.      Marland Kitchen HYDROCODONE-ACETAMINOPHEN 5-500 MG PO CAPS Oral Take 1 capsule by mouth every 6 (six) hours as needed. For pain    . METFORMIN HCL 500 MG PO TABS Oral Take 1 tablet by mouth daily with breakfast.     . NITROGLYCERIN 0.4 MG SL SUBL Sublingual Place 0.4 mg under the tongue every 5 (five) minutes as needed. For chest pain    . OMEPRAZOLE MAGNESIUM 20 MG PO TBEC Oral Take 20 mg by mouth daily.      Marland Kitchen PAROXETINE HCL 20 MG PO TABS Oral Take 20 mg by mouth every morning.        BP 174/67  Pulse  65  Temp 98.3 F (36.8 C) (Oral)  Resp 11  SpO2 99%  Physical Exam  Nursing note and vitals reviewed. Constitutional: She is oriented to person, place, and time. She appears well-developed and well-nourished.  HENT:  Head: Normocephalic and atraumatic.  Eyes: EOM are normal. Pupils are equal, round, and reactive to light.  Neck: Normal range of motion. Neck supple.  Cardiovascular: Normal rate, normal heart sounds and intact distal pulses.   Pulmonary/Chest: Effort normal and breath sounds normal.  Abdominal: Bowel sounds are normal. She exhibits no distension. There is no tenderness.  Musculoskeletal: Normal range of motion. She exhibits no edema and no tenderness.  Neurological: She is alert and oriented to person, place, and time. She has normal strength. No cranial nerve deficit or sensory deficit.  Skin: Skin is warm and dry. No rash noted.  Psychiatric: She has a normal mood and affect.    ED Course  Procedures (including critical care time)  Labs Reviewed  CBC - Abnormal; Notable for the following:    HCT 35.8 (*)     All other components within normal limits  BASIC METABOLIC PANEL  - Abnormal; Notable for the following:    Glucose, Bld 113 (*)     GFR calc non Af Amer 74 (*)     GFR calc Af Amer 86 (*)     All other components within normal limits  PROTIME-INR  POCT I-STAT TROPONIN I   Dg Chest 2 View  04/04/2012  *RADIOLOGY REPORT*  Clinical Data: Chest pain  CHEST - 2 VIEW  Comparison: 05/19/2010  Findings: Lungs are essentially clear. No pleural effusion or pneumothorax.  Cardiomediastinal silhouette is within normal limits. Postsurgical changes related to prior CABG.  Mild degenerative changes of the visualized thoracolumbar spine.  Cholecystectomy clips.  IMPRESSION: No evidence of acute cardiopulmonary disease.   Original Report Authenticated By: Charline Bills, M.D.      No diagnosis found.    MDM   Date: 04/04/2012  Rate: 65  Rhythm: normal sinus rhythm  QRS Axis: normal  Intervals: normal  ST/T Wave abnormalities: nonspecific T wave changes  Conduction Disutrbances:none  Narrative Interpretation:   Old EKG Reviewed: unchanged  Labs and imaging ordered in Triage unremarkable. Pt continues to have pain so NTG was ordered. Discussed with [facility] who will come admit the patient.           Geneveive Furness B. Bernette Mayers, MD 04/04/12 2324

## 2012-04-05 ENCOUNTER — Encounter (HOSPITAL_COMMUNITY): Payer: Self-pay | Admitting: Physician Assistant

## 2012-04-05 ENCOUNTER — Encounter (HOSPITAL_COMMUNITY)
Admission: EM | Disposition: A | Discharge status: Home / Self Care | Payer: Self-pay | Source: Home / Self Care | Attending: Cardiology

## 2012-04-05 DIAGNOSIS — R079 Chest pain, unspecified: Secondary | ICD-10-CM | POA: Diagnosis not present

## 2012-04-05 DIAGNOSIS — I2510 Atherosclerotic heart disease of native coronary artery without angina pectoris: Secondary | ICD-10-CM | POA: Diagnosis not present

## 2012-04-05 HISTORY — PX: LEFT HEART CATHETERIZATION WITH CORONARY/GRAFT ANGIOGRAM: SHX5450

## 2012-04-05 LAB — COMPREHENSIVE METABOLIC PANEL
Albumin: 4 g/dL (ref 3.5–5.2)
BUN: 12 mg/dL (ref 6–23)
Creatinine, Ser: 0.82 mg/dL (ref 0.50–1.10)
GFR calc Af Amer: 85 mL/min — ABNORMAL LOW (ref >90)
Total Protein: 6.7 g/dL (ref 6.0–8.3)

## 2012-04-05 LAB — LIPID PANEL
HDL: 33 mg/dL — ABNORMAL LOW (ref >39)
LDL Cholesterol: UNDETERMINED mg/dL (ref 0–99)
Total CHOL/HDL Ratio: 6 RATIO
VLDL: UNDETERMINED mg/dL (ref 0–40)

## 2012-04-05 LAB — CARDIAC PANEL(CRET KIN+CKTOT+MB+TROPI)
CK, MB: 1.6 ng/mL (ref 0.3–4.0)
CK, MB: 1.5 ng/mL (ref 0.3–4.0)
Relative Index: INVALID (ref 0.0–2.5)
Total CK: 47 U/L (ref 7–177)
Total CK: 49 U/L (ref 7–177)
Troponin I: <0.3 ng/mL (ref <0.30)

## 2012-04-05 LAB — CBC
HCT: 34.4 % — ABNORMAL LOW (ref 36.0–46.0)
Hemoglobin: 12.2 g/dL (ref 12.0–15.0)
RBC: 3.75 MIL/uL — ABNORMAL LOW (ref 3.87–5.11)

## 2012-04-05 LAB — GLUCOSE, CAPILLARY
Glucose-Capillary: 143 mg/dL — ABNORMAL HIGH (ref 70–99)
Glucose-Capillary: 123 mg/dL — ABNORMAL HIGH (ref 70–99)

## 2012-04-05 LAB — HEPARIN LEVEL (UNFRACTIONATED): Heparin Unfractionated: <0.1 IU/mL — ABNORMAL LOW (ref 0.30–0.70)

## 2012-04-05 SURGERY — LEFT HEART CATHETERIZATION WITH CORONARY/GRAFT ANGIOGRAM
Anesthesia: LOCAL

## 2012-04-05 MED ORDER — LIDOCAINE HCL (PF) 1 % IJ SOLN
INTRAMUSCULAR | Status: AC
  Filled 2012-04-05: qty 30

## 2012-04-05 MED ORDER — SODIUM CHLORIDE 0.9 % IV SOLN: 1.0000 mL/kg/h | INTRAVENOUS | Status: DC

## 2012-04-05 MED ORDER — DIAZEPAM 2 MG PO TABS
2.0000 mg | ORAL_TABLET | ORAL | Status: AC
  Administered 2012-04-05: 2 mg via ORAL
  Filled 2012-04-05: qty 1

## 2012-04-05 MED ORDER — ONDANSETRON HCL 4 MG/2ML IJ SOLN: 4.0000 mg | Freq: Four times a day (QID) | INTRAMUSCULAR | Status: DC | PRN

## 2012-04-05 MED ORDER — NITROGLYCERIN 0.2 MG/ML ON CALL CATH LAB
INTRAVENOUS | Status: AC
  Filled 2012-04-05: qty 1

## 2012-04-05 MED ORDER — PANTOPRAZOLE SODIUM 40 MG PO TBEC: 40.0000 mg | DELAYED_RELEASE_TABLET | Freq: Every day | ORAL | Status: DC

## 2012-04-05 MED ORDER — SODIUM CHLORIDE 0.9 % IJ SOLN: 3.0000 mL | Freq: Two times a day (BID) | INTRAMUSCULAR | Status: DC

## 2012-04-05 MED ORDER — INSULIN ASPART 100 UNIT/ML ~~LOC~~ SOLN: 0.0000 [IU] | Freq: Three times a day (TID) | SUBCUTANEOUS | Status: DC

## 2012-04-05 MED ORDER — METFORMIN HCL 500 MG PO TABS: 500.0000 mg | ORAL_TABLET | Freq: Every day | ORAL | Status: DC

## 2012-04-05 MED ORDER — ACETAMINOPHEN 325 MG PO TABS: 650.0000 mg | ORAL_TABLET | ORAL | Status: DC | PRN

## 2012-04-05 MED ORDER — MIDAZOLAM HCL 2 MG/2ML IJ SOLN
INTRAMUSCULAR | Status: AC
  Filled 2012-04-05: qty 2

## 2012-04-05 MED ORDER — ASPIRIN 81 MG PO CHEW
324.0000 mg | CHEWABLE_TABLET | Freq: Once | ORAL | Status: AC
  Administered 2012-04-05: 324 mg via ORAL

## 2012-04-05 MED ORDER — FENTANYL CITRATE 0.05 MG/ML IJ SOLN
INTRAMUSCULAR | Status: AC
  Filled 2012-04-05: qty 2

## 2012-04-05 MED ORDER — HEPARIN BOLUS VIA INFUSION
2000.0000 [IU] | Freq: Once | INTRAVENOUS | Status: AC
  Administered 2012-04-05: 2000 [IU] via INTRAVENOUS
  Filled 2012-04-05: qty 2000

## 2012-04-05 MED ORDER — HEPARIN (PORCINE) IN NACL 2-0.9 UNIT/ML-% IJ SOLN
INTRAMUSCULAR | Status: AC
  Filled 2012-04-05: qty 2000

## 2012-04-05 MED ORDER — SODIUM CHLORIDE 0.9 % IV SOLN: 250.0000 mL | INTRAVENOUS | Status: DC | PRN

## 2012-04-05 MED ORDER — SODIUM CHLORIDE 0.9 % IJ SOLN: 3.0000 mL | INTRAMUSCULAR | Status: DC | PRN

## 2012-04-05 MED ORDER — NITROGLYCERIN 0.4 MG SL SUBL: 0.4000 mg | SUBLINGUAL_TABLET | SUBLINGUAL | Status: DC | PRN

## 2012-04-05 MED ORDER — ASPIRIN EC 81 MG PO TBEC: 81.0000 mg | DELAYED_RELEASE_TABLET | Freq: Every day | ORAL | Status: DC

## 2012-04-05 MED ORDER — ASPIRIN 81 MG PO CHEW
324.0000 mg | CHEWABLE_TABLET | ORAL | Status: DC
  Filled 2012-04-05: qty 4

## 2012-04-05 NOTE — Progress Notes (Signed)
Pt d/c home with instructions and f/u appointments, pt verbalized understanding, home with husband. Escorted out by [employer].

## 2012-04-05 NOTE — Care Management Note (Unsigned)
    Page 1 of 1   04/05/2012     9:11:04 AM   CARE MANAGEMENT NOTE 04/05/2012  Patient:  [name], [name]   Account Number:  [id number]  Date Initiated:  04/05/2012  Documentation initiated by:  SIMMONS,Fernande Treiber  Subjective/Objective Assessment:   ADMITTED WITH CP; LIVES AT HOME WITH HUSBAND- CARL; WAS IPTA; NO DME; USES [facility] FOR RX.     Action/Plan:   DISCHARGE PLANNING DISCUSSED AT BEDSIDE.   Anticipated DC Date:  04/06/2012   Anticipated DC Plan:  HOME/SELF CARE      DC Planning Services  CM consult      Choice offered to / List presented to:             Status of service:  In process, will continue to follow Medicare Important Message given?   (If response is "NO", the following Medicare IM given date fields will be blank) Date Medicare IM given:   Date Additional Medicare IM given:    Discharge Disposition:    Per UR Regulation:  Reviewed for med. necessity/level of care/duration of stay  If discussed at Long Length of Stay Meetings, dates discussed:    Comments:  04/05/12  1011  Makenli Derstine SIMMONS RN, BSN [phone] NCM WILL FOLLOW.

## 2012-04-05 NOTE — Interval H&P Note (Signed)
History and Physical Interval Note:  04/05/2012 12:06 PM  [name]  has presented today for surgery, with the diagnosis of cp  The various methods of treatment have been discussed with the patient and family. After consideration of risks, benefits and other options for treatment, the patient has consented to  Procedure(s) (LRB): LEFT HEART CATHETERIZATION WITH CORONARY/GRAFT ANGIOGRAM (N/A) as a surgical intervention .  The patient's history has been reviewed, patient examined, no change in status, stable for surgery.  I have reviewed the patient's chart and labs.  Questions were answered to the patient's satisfaction.     [name]

## 2012-04-05 NOTE — Discharge Summary (Signed)
Discharge Summary   Patient ID: [name] MRN: [id number], DOB/AGE: 1946-02-22 66 y.o. Admit date: 04/04/2012 D/C date:     04/05/2012  Primary Cardiologist: Jens Som  Primary Discharge Diagnoses:  1. Chest pain, noncardiac 2. CAD  - cath this admission without ischemic territories or targets for intervention (patent saphenous vein graft to PDA, saphenous vein graft sequence to OM branches, and LIMA to LAD) - prior hx: s/p CABG 2005, subsequent DES of SVG->RCA in 2011 3. HTN 4. Hyperlipidemia (not on statin due to hx of transaminitis) 5. History of elevated liver enzymes  Secondary Discharge Diagnoses:  1. Esophageal reflux 2. Diabetes mellitus 3. Depression 4. Hernia 5. History of cholecystectomy  Hospital Course: [name] is a 66 y/o F with hx of CAD s/p CABG, stenting who presented to [facility] with complaints of chest pain. She's had recurrent admissions for chest pain and apparently discussions were had regarding repeat catheterization at that time versus medical therapy and the latter was pursued. She came to the hospital because of worsening of her chronic chest pain syndrome characterized by a midsternal discomfort with radiation into her left arm and associated with back discomfort. It has been relieved with rest and over the last couple days triggered by exertion. She took nitroglycerin today with relief on 2 occasions. She has received nitroglycerin again in the emergency room with relief. Her EKG was notable for lateral ST segment depression of about 0.5 mm and flattening. She was admitted, placed on heparin, enzymes were cycled and cardiac cath was recommended. Troponins remained negative. She underwent cardiac cath this morning demonstrating severe native 3V dz, and continued patency of saphenous vein graft to PDA, saphenous vein graft sequence to OM branches, and LIMA to LAD. She had normal LV function. Dr. Excell Seltzer did not see any targets for intervention and did  not see any territories that appear ischemic based on continued patency of all of her grafts. He recommended continued medical therapy. Note that the patient was not tachycardic, tachypnic or hypoxic. Dr. Excell Seltzer has seen and examined the patient today and feels she is stable for discharge.  I clarified with the pt re: her clonidine dose as it was not initially clear on intake paperwork. She clarified she is taking 0.2mg  PO qhs and reports that her BP has been relatively well controlled on this regimen outside the hospital. We will resume her home regimen.   Discharge Vitals: Blood pressure 122/59, pulse 64, temperature 98.8 F (37.1 C), temperature source Oral, resp. rate 18, height 5\' 3"  (1.6 m), weight 153 lb 7 oz (69.6 kg), SpO2 99.00%.  Labs: Lab Results  Component Value Date   WBC 3.9* 04/05/2012   HGB 12.2 04/05/2012   HCT 34.4* 04/05/2012   MCV 91.7 04/05/2012   PLT 175 04/05/2012     Lab 04/05/12 0600  NA 144  K 3.8  CL 106  CO2 30  BUN 12  CREATININE 0.82  CALCIUM 9.8  PROT 6.7  BILITOT 0.5  ALKPHOS 85  ALT 43*  AST 32  GLUCOSE 141*    Basename 04/05/12 0906 04/05/12 0251 04/04/12 2050  CKTOTAL 49 47 54  CKMB 1.5 1.6 1.9  TROPONINI <0.30 <0.30 <0.30   Lab Results  Component Value Date   CHOL 198 04/05/2012   HDL 33* 04/05/2012   LDLCALC UNABLE TO CALCULATE IF TRIGLYCERIDE OVER 400 mg/dL 11/15/8117   TRIG 147* 04/06/5620     Diagnostic Studies/Procedures   1. Dg Chest 2 View  04/04/2012  *RADIOLOGY REPORT*  Clinical Data: Chest pain  CHEST - 2 VIEW  Comparison: 05/19/2010  Findings: Lungs are essentially clear. No pleural effusion or pneumothorax.  Cardiomediastinal silhouette is within normal limits. Postsurgical changes related to prior CABG.  Mild degenerative changes of the visualized thoracolumbar spine.  Cholecystectomy clips.  IMPRESSION: No evidence of acute cardiopulmonary disease.   Original Report Authenticated By: Charline Bills, M.D.    2. Cardiac  catheterization this admission, please see full report and above for summary.   Discharge Medications   Current Discharge Medication List    START taking these medications   Details  pantoprazole (PROTONIX) 40 MG tablet Take 1 tablet (40 mg total) by mouth daily. Qty: 30 tablet, Refills: 2   Comments: Some studies suggest that Prilosec may interact with Plavix. Take this medicine (Protonix) for less chance of interaction.      CONTINUE these medications which have CHANGED   Details  metFORMIN (GLUCOPHAGE) 500 MG tablet Take 1 tablet (500 mg total) by mouth daily with breakfast.   Comments: ---------------IMPORTANT: do not restart until the morning of 04/08/12. Held until 8/31 given that she takes it in the morning and CBGs 113-144 here (do not want her to get hypoglycemic if she takes PM 8/30 then again AM 8/31)    nitroGLYCERIN (NITROSTAT) 0.4 MG SL tablet Place 1 tablet (0.4 mg total) under the tongue every 5 (five) minutes as needed (up to 3 doses). For chest pain      CONTINUE these medications which have NOT CHANGED   Details  ALPRAZolam (XANAX) 0.5 MG tablet Take 0.5 mg by mouth 3 (three) times daily as needed. For anxiety    aspirin 81 MG tablet Take 81 mg by mouth daily.      atenolol (TENORMIN) 50 MG tablet Take 75 mg by mouth daily.     cloNIDine (CATAPRES) 0.2 MG tablet Take 0.2 mg by mouth at bedtime.    clopidogrel (PLAVIX) 75 MG tablet Take 75 mg by mouth daily.      fish oil-omega-3 fatty acids 1000 MG capsule Take 2 g by mouth 2 (two) times daily.     furosemide (LASIX) 40 MG tablet Take 40 mg by mouth daily.      gemfibrozil (LOPID) 600 MG tablet Take 600 mg by mouth 2 (two) times daily.      hydrocodone-acetaminophen (LORCET-HD) 5-500 MG per capsule Take 1 capsule by mouth every 6 (six) hours as needed. For pain    PARoxetine (PAXIL) 20 MG tablet Take 20 mg by mouth every morning.        STOP taking these medications     omeprazole (PRILOSEC OTC) 20 MG  tablet Comments:  Reason for Stopping:          Disposition   The patient will be discharged in stable condition to home. Discharge Orders    Future Appointments: Provider: Department: Dept Phone: Center:   05/01/2012 11:15 AM Lewayne Bunting, MD [facility] [phone] [facility]     Future Orders Please Complete By Expires   Diet - low sodium heart healthy      Comments:   Diabetic diet   Increase activity slowly      Comments:   No driving for 2 days. No lifting over 5 lbs for 1 week. No sexual activity for 1 week. Keep procedure site clean & dry. If you notice increased pain, swelling, bleeding or pus, call/return!  You may shower, but no soaking baths/hot tubs/pools  for 1 week.     Follow-up Information    Follow up with Olga Millers, MD. (05/01/12 at 11:15am. )    Contact information:   [address], [address] [place] [address] [phone]       Follow up with Juliette Alcide, MD. (To evaluate for other possible causes of chest pain if it recurs)    Contact information:   [address] [facility]. [place] [address] [phone]            Duration of Discharge Encounter: Greater than 30 minutes including physician and PA time.  Signed, Ronie Spies PA-C 04/05/2012, 4:39 PM

## 2012-04-05 NOTE — Progress Notes (Signed)
@   Subjective:  Denies CP or dyspnea   Objective:  Filed Vitals:   04/04/12 1930 04/04/12 2000 04/04/12 2103 04/05/12 0430  BP: 131/77 148/75 146/68 120/62  Pulse: 62 74 66 61  Temp:   99.4 F (37.4 C) 98.6 F (37 C)  TempSrc:   Oral Oral  Resp: 15 15 18 16  Height:  5' 3" (1.6 m) 5' 3" (1.6 m)   Weight:  152 lb (68.947 kg) 153 lb 7 oz (69.6 kg)   SpO2: 98% 99% 97% 96%    Intake/Output from previous day: No intake or output data in the 24 hours ending 04/05/12 0655  Physical Exam: Physical exam: Well-developed well-nourished in no acute distress.  Skin is warm and dry.  HEENT is normal.  Neck is supple. Chest is clear to auscultation with normal expansion. Previous sternotomy Cardiovascular exam is regular rate and rhythm.  Abdominal exam nontender or distended. No masses palpated. Extremities show no edema. neuro grossly intact    Lab Results: Basic Metabolic Panel:  Basename 04/05/12 0600 04/04/12 1507  NA 144 140  K 3.8 3.5  CL 106 101  CO2 30 28  GLUCOSE 141* 113*  BUN 12 13  CREATININE 0.82 0.81  CALCIUM 9.8 10.2  MG -- --  PHOS -- --   CBC:  Basename 04/05/12 0600 04/04/12 1507  WBC 3.9* 4.3  NEUTROABS -- --  HGB 12.2 12.8  HCT 34.4* 35.8*  MCV 91.7 90.6  PLT 175 208   Cardiac Enzymes:  Basename 04/05/12 0251 04/04/12 2050  CKTOTAL 47 54  CKMB 1.6 1.9  CKMBINDEX -- --  TROPONINI <0.30 <0.30     Assessment/Plan:  1) Chest pain - difficult situation; patient has had problems with chronic CP in past but also has significant CAD; feel best option is cardiac cath. Risks and benefits discussed and patient agrees to proceed. Enzymes negative. Continue present meds. Hold lasix prior to cath. 2) Hypertension - continue present BP meds. 3) Hyperlipidemia - not on statin due to elevated LFTs. 4) DM - hold glucophage for 48 hours following cath  [name] 04/05/2012, 6:55 AM    

## 2012-04-05 NOTE — Progress Notes (Signed)
ANTICOAGULATION CONSULT NOTE - Follow Up Consult  Pharmacy Consult for Heparin  Indication: chest pain/ACS  No Known Allergies  Patient Measurements: Height: 5\' 3"  (160 cm) Weight: 153 lb 7 oz (69.6 kg) IBW/kg (Calculated) : 52.4  Heparin Dosing Weight: 68kg  Vital Signs: Temp: 98.6 F (37 C) (08/28 0430) Temp src: Oral (08/28 0430) BP: 120/62 mmHg (08/28 0430) Pulse Rate: 61  (08/28 0430)  Labs:  Basename 04/05/12 0600 04/05/12 0251 04/04/12 2050 04/04/12 1507  HGB 12.2 -- -- 12.8  HCT 34.4* -- -- 35.8*  PLT 175 -- -- 208  APTT -- -- -- --  LABPROT -- -- -- 13.7  INR -- -- -- 1.03  HEPARINUNFRC <0.10* -- -- --  CREATININE 0.82 -- -- 0.81  CKTOTAL -- 47 54 --  CKMB -- 1.6 1.9 --  TROPONINI -- <0.30 <0.30 --    Estimated Creatinine Clearance: 63.2 ml/min (by C-G formula based on Cr of 0.82).   Medical History: Past Medical History  Diagnosis Date  . Hypertension   . Hyperlipidemia   . Coronary artery disease status post CABG 2005     Stenting of SVG to PDA  . Acute myocardial infarction, unspecified site, episode of care unspecified   . Esophageal reflux   . Diabetes mellitus   . GERD (gastroesophageal reflux disease)   . Depression   . History of cholecystectomy   . Hernia   . Elevated transaminase level     Most recently October 2012 most recent     Assessment: 57 yof with hx of CAD/CABG and PCI to SVG in 2011.  She is a newly Dx DM as well.  She presents to ED with CP with exertion and radiation down L arm. She states compliance with plavix PTA but is on no other anticoagulants PTA.  CBC stable, INR 1.03 and normal renal fx CrCl approx 45ml/min.  Initial heparin level <0.1 units/hr.  No problems notes with infusion per RN.  Goal of Therapy:  Heparin level 0.3-0.7 units/ml Monitor platelets by anticoagulation protocol: Yes   Plan:  Heparin bolus 2000 uts IV x1 Increase heparin drip to 1000 uts/hr Check heparin level in 8 hours.  [name]  [name] 04/05/2012,6:58 AM

## 2012-04-05 NOTE — H&P (View-Only) (Signed)
@   Subjective:  Denies CP or dyspnea   Objective:  Filed Vitals:   04/04/12 1930 04/04/12 2000 04/04/12 2103 04/05/12 0430  BP: 131/77 148/75 146/68 120/62  Pulse: 62 74 66 61  Temp:   99.4 F (37.4 C) 98.6 F (37 C)  TempSrc:   Oral Oral  Resp: 15 15 18 16   Height:  5\' 3"  (1.6 m) 5\' 3"  (1.6 m)   Weight:  152 lb (68.947 kg) 153 lb 7 oz (69.6 kg)   SpO2: 98% 99% 97% 96%    Intake/Output from previous day: No intake or output data in the 24 hours ending 04/05/12 0655  Physical Exam: Physical exam: Well-developed well-nourished in no acute distress.  Skin is warm and dry.  HEENT is normal.  Neck is supple. Chest is clear to auscultation with normal expansion. Previous sternotomy Cardiovascular exam is regular rate and rhythm.  Abdominal exam nontender or distended. No masses palpated. Extremities show no edema. neuro grossly intact    Lab Results: Basic Metabolic Panel:  Basename 04/05/12 0600 04/04/12 1507  NA 144 140  K 3.8 3.5  CL 106 101  CO2 30 28  GLUCOSE 141* 113*  BUN 12 13  CREATININE 0.82 0.81  CALCIUM 9.8 10.2  MG -- --  PHOS -- --   CBC:  Basename 04/05/12 0600 04/04/12 1507  WBC 3.9* 4.3  NEUTROABS -- --  HGB 12.2 12.8  HCT 34.4* 35.8*  MCV 91.7 90.6  PLT 175 208   Cardiac Enzymes:  Basename 04/05/12 0251 04/04/12 2050  CKTOTAL 47 54  CKMB 1.6 1.9  CKMBINDEX -- --  TROPONINI <0.30 <0.30     Assessment/Plan:  1) Chest pain - difficult situation; patient has had problems with chronic CP in past but also has significant CAD; feel best option is cardiac cath. Risks and benefits discussed and patient agrees to proceed. Enzymes negative. Continue present meds. Hold lasix prior to cath. 2) Hypertension - continue present BP meds. 3) Hyperlipidemia - not on statin due to elevated LFTs. 4) DM - hold glucophage for 48 hours following cath  [facility] 04/05/2012, 6:55 AM

## 2012-04-05 NOTE — CV Procedure (Signed)
   Cardiac Catheterization Procedure Note  Name: [name] MRN: [id number] DOB: 30-Nov-1945  Procedure: Left Heart Cath, Selective Coronary Angiography, saphenous vein graft angiography, LIMA angiography, LV angiography  Indication: Chest pain with known coronary artery disease. This patient has a history of coronary bypass surgery and multiple PCI procedures. She presents with recurrent chest pain typical of her prior angina.  Procedural details: The right groin was prepped, draped, and anesthetized with 1% lidocaine. Using modified Seldinger technique, a 5 French sheath was introduced into the right femoral artery. Standard Judkins catheters were used for coronary angiography and left ventriculography. The JR 4 catheter was used for LIMA angiography and saphenous vein graft angiography. Catheter exchanges were performed over a guidewire. There were no immediate procedural complications. The patient was transferred to the post catheterization recovery area for further monitoring.  Procedural Findings: Hemodynamics:  AO 140/73 LV 142/17   Coronary angiography: Coronary dominance: right  Left mainstem: Patent with 30% proximal stenosis. There is no high-grade left main disease   Left anterior descending (LAD): The LAD is patent in the proximal aspect. There is 50% ostial stenosis. The vessel occludes after the first diagonal branch. The first diagonal is large in caliber with no significant obstruction.  Left circumflex (LCx): The left circumflex is patent into the mid AV groove where it totally occludes.  Right coronary artery (RCA): The right coronary artery is patent until the RV marginal branch where it is totally occluded  Saphenous vein graft sequence to OM1 and OM 2 is patent. There is a dilated area that is probably a valve just before the first OM branch anastomotic site. There is no significant obstruction throughout the course of the vein graft. Both anastomotic sites are  widely patent.  Saphenous vein graft to PDA: The graft is patent. There is mild diffuse proximal stenosis. There is no high-grade disease present. The area of overlapping stents in the mid body of the graft are patent with 30-40% in-stent restenosis. The PDA is of large caliber.  The LIMA to LAD is patent. The LAD beyond the LIMA insertion site is small.  Left ventriculography: Left ventricular systolic function is normal, LVEF is estimated at 55-65%, there is no significant mitral regurgitation   Final Conclusions:   1. Severe native three-vessel coronary artery disease with total occlusion of the mid LAD, mid left circumflex, and mid right coronary arteries. 2. Status post coronary bypass surgery with continued patency of the saphenous vein graft to PDA, saphenous vein graft sequence to OM branches, and LIMA to LAD. 3. Normal LV systolic function.  Recommendations: Continue medical therapy. I do not see any targets for intervention and I do not see any territories that appear ischemic based on continued patency of all of her grafts  [name] 04/05/2012, 12:09 PM

## 2012-05-01 ENCOUNTER — Ambulatory Visit (INDEPENDENT_AMBULATORY_CARE_PROVIDER_SITE_OTHER): Payer: Medicare Other | Admitting: Cardiology

## 2012-05-01 ENCOUNTER — Encounter: Payer: Self-pay | Admitting: Cardiology

## 2012-05-01 VITALS — BP 163/81 | HR 56 | Wt 155.0 lb

## 2012-05-01 DIAGNOSIS — I2510 Atherosclerotic heart disease of native coronary artery without angina pectoris: Secondary | ICD-10-CM

## 2012-05-01 DIAGNOSIS — I10 Essential (primary) hypertension: Secondary | ICD-10-CM

## 2012-05-01 DIAGNOSIS — E785 Hyperlipidemia, unspecified: Secondary | ICD-10-CM | POA: Diagnosis not present

## 2012-05-01 NOTE — Progress Notes (Signed)
HPI: [name] is a pleasant female who has a history of coronary artery disease status post coronary bypassing graft in 2005. She has had prior PCI of the saphenous vein graft to the right coronary artery. Admitted in August of 2013 with recurrent chest pain. Cardiac cath showed severe three-vessel coronary artery disease. Her grafts were patent. Since DC, there is no dyspnea on exertion, orthopnea, PND or syncope. She does not have exertional chest pain. She states her chest pain has improved after discontinuing metformin.   Current Outpatient Prescriptions  Medication Sig Dispense Refill  . ALPRAZolam (XANAX) 0.5 MG tablet Take 0.5 mg by mouth 3 (three) times daily as needed. For anxiety      . aspirin 81 MG tablet Take 81 mg by mouth daily.        Marland Kitchen atenolol (TENORMIN) 50 MG tablet Take 75 mg by mouth daily.       . cloNIDine (CATAPRES) 0.2 MG tablet Take 0.2 mg by mouth at bedtime.      . clopidogrel (PLAVIX) 75 MG tablet Take 75 mg by mouth daily.        . fish oil-omega-3 fatty acids 1000 MG capsule Take 2 g by mouth 2 (two) times daily.       . furosemide (LASIX) 40 MG tablet Take 40 mg by mouth daily.        Marland Kitchen gemfibrozil (LOPID) 600 MG tablet Take 600 mg by mouth 2 (two) times daily.        . hydrocodone-acetaminophen (LORCET-HD) 5-500 MG per capsule Take 1 capsule by mouth every 6 (six) hours as needed. For pain      . nitroGLYCERIN (NITROSTAT) 0.4 MG SL tablet Place 1 tablet (0.4 mg total) under the tongue every 5 (five) minutes as needed (up to 3 doses). For chest pain      . pantoprazole (PROTONIX) 40 MG tablet Take 1 tablet (40 mg total) by mouth daily.  30 tablet  2  . PARoxetine (PAXIL) 20 MG tablet Take 20 mg by mouth every morning.           Past Medical History  Diagnosis Date  . Hypertension   . Hyperlipidemia     a. Not on statin due to elevated liver enzymes.  . Coronary artery disease status post CABG 2005     a. s/p CABG 2005. b. 2011 - DES of SVG to PDA. c.  Admitted with CP 03/2012 with stable anatomy by cath, felt noncardiac pain.  . Acute myocardial infarction, unspecified site, episode of care unspecified   . Esophageal reflux   . Diabetes mellitus   . GERD (gastroesophageal reflux disease)   . Depression   . History of cholecystectomy   . Hernia   . Elevated transaminase level     Most recently October 2012 most recent    Past Surgical History  Procedure Date  . Bypass graft 08/2003    Quadruple   . Abdominal hysterectomy   . Cholecystectomy   . Hernia repair 01/2011    History   Social History  . Marital Status: Married    Spouse Name: N/A    Number of Children: 2  . Years of Education: N/A   Occupational History  . Part-Time hair-dresser    Social History Main Topics  . Smoking status: Never Smoker   . Smokeless tobacco: Never Used  . Alcohol Use: No  . Drug Use: No  . Sexually Active:    Other Topics Concern  .  Not on file   Social History Narrative  . No narrative on file    ROS: no fevers or chills, productive cough, hemoptysis, dysphasia, odynophagia, melena, hematochezia, dysuria, hematuria, rash, seizure activity, orthopnea, PND, pedal edema, claudication. Remaining systems are negative.  Physical Exam: Well-developed well-nourished in no acute distress.  Skin is warm and dry.  HEENT is normal.  Neck is supple.  Chest is clear to auscultation with normal expansion.  Cardiovascular exam is regular rate and rhythm.  Abdominal exam nontender or distended. No masses palpated. Right groin with no hematoma and no bruit. Extremities show no edema. neuro grossly intact

## 2012-05-01 NOTE — Assessment & Plan Note (Signed)
Continue diet. Not on a statin because of increased liver functions.

## 2012-05-01 NOTE — Assessment & Plan Note (Signed)
Continue aspirin but discontinue Plavix. Recent catheterization reveals patent grafts. Continue medical therapy.

## 2012-05-01 NOTE — Patient Instructions (Addendum)
Your physician has recommended you make the following change in your medication:   STOP PLAVIX  Your physician wants you to follow-up in: 1 YEAR  You will receive a reminder letter in the mail two months in advance. If you don't receive a letter, please call our office to schedule the follow-up appointment.

## 2012-05-01 NOTE — Assessment & Plan Note (Signed)
Blood pressure mildly elevated. However she does follow this at home and it is typically controlled. Continue present medications.

## 2012-05-25 DIAGNOSIS — H9209 Otalgia, unspecified ear: Secondary | ICD-10-CM | POA: Diagnosis not present

## 2012-05-25 DIAGNOSIS — M545 Low back pain: Secondary | ICD-10-CM | POA: Diagnosis not present

## 2012-07-04 DIAGNOSIS — Z23 Encounter for immunization: Secondary | ICD-10-CM | POA: Diagnosis not present

## 2012-07-12 DIAGNOSIS — E782 Mixed hyperlipidemia: Secondary | ICD-10-CM | POA: Diagnosis not present

## 2012-07-12 DIAGNOSIS — I10 Essential (primary) hypertension: Secondary | ICD-10-CM | POA: Diagnosis not present

## 2012-07-17 DIAGNOSIS — M545 Low back pain: Secondary | ICD-10-CM | POA: Diagnosis not present

## 2012-07-17 DIAGNOSIS — E782 Mixed hyperlipidemia: Secondary | ICD-10-CM | POA: Diagnosis not present

## 2012-07-17 DIAGNOSIS — K219 Gastro-esophageal reflux disease without esophagitis: Secondary | ICD-10-CM | POA: Diagnosis not present

## 2012-07-17 DIAGNOSIS — E119 Type 2 diabetes mellitus without complications: Secondary | ICD-10-CM | POA: Diagnosis not present

## 2012-07-17 DIAGNOSIS — I10 Essential (primary) hypertension: Secondary | ICD-10-CM | POA: Diagnosis not present

## 2012-08-01 ENCOUNTER — Other Ambulatory Visit: Payer: Self-pay | Admitting: Physician Assistant

## 2012-08-22 DIAGNOSIS — E119 Type 2 diabetes mellitus without complications: Secondary | ICD-10-CM | POA: Diagnosis not present

## 2012-09-05 DIAGNOSIS — K219 Gastro-esophageal reflux disease without esophagitis: Secondary | ICD-10-CM | POA: Diagnosis not present

## 2012-09-05 DIAGNOSIS — I10 Essential (primary) hypertension: Secondary | ICD-10-CM | POA: Diagnosis not present

## 2012-09-05 DIAGNOSIS — E119 Type 2 diabetes mellitus without complications: Secondary | ICD-10-CM | POA: Diagnosis not present

## 2012-09-05 DIAGNOSIS — R1084 Generalized abdominal pain: Secondary | ICD-10-CM | POA: Diagnosis not present

## 2012-09-08 DIAGNOSIS — R1013 Epigastric pain: Secondary | ICD-10-CM | POA: Diagnosis not present

## 2012-09-13 DIAGNOSIS — N281 Cyst of kidney, acquired: Secondary | ICD-10-CM | POA: Diagnosis not present

## 2012-09-13 DIAGNOSIS — R1013 Epigastric pain: Secondary | ICD-10-CM | POA: Diagnosis not present

## 2012-09-13 DIAGNOSIS — I700 Atherosclerosis of aorta: Secondary | ICD-10-CM | POA: Diagnosis not present

## 2012-09-13 DIAGNOSIS — K7689 Other specified diseases of liver: Secondary | ICD-10-CM | POA: Diagnosis not present

## 2012-09-18 ENCOUNTER — Telehealth: Payer: Self-pay | Admitting: Gastroenterology

## 2012-09-18 NOTE — Telephone Encounter (Signed)
Pt states that her LFT's were elevated and she requests to be seen. Pt scheduled to see Dr. Arlyce Dice 09/20/12@ 9:45am. Pt states her PCP mentioned cysts on her kidneys also. Discussed with pt that Dr. Arlyce Dice would not be the specialist to address her kidney a urologist would be who she needed to see. Pt given the phone number for [facility].

## 2012-09-20 ENCOUNTER — Encounter: Payer: Self-pay | Admitting: Gastroenterology

## 2012-09-20 ENCOUNTER — Other Ambulatory Visit: Payer: Medicare Other

## 2012-09-20 ENCOUNTER — Ambulatory Visit (INDEPENDENT_AMBULATORY_CARE_PROVIDER_SITE_OTHER): Payer: Medicare Other | Admitting: Gastroenterology

## 2012-09-20 VITALS — BP 134/80 | HR 60 | Ht 63.5 in | Wt 171.1 lb

## 2012-09-20 DIAGNOSIS — R7989 Other specified abnormal findings of blood chemistry: Secondary | ICD-10-CM | POA: Diagnosis not present

## 2012-09-20 DIAGNOSIS — R945 Abnormal results of liver function studies: Secondary | ICD-10-CM

## 2012-09-20 DIAGNOSIS — R110 Nausea: Secondary | ICD-10-CM

## 2012-09-20 NOTE — Progress Notes (Signed)
History of Present Illness: 67 year old white female with diabetes, coronary artery disease, GERD referred for evaluation the liver tests abnormalities and nausea. Transaminases up to 2-1/2 times normal at the noted for the last 2 years. The lesions have not been persistent but rather intermittent. Her progress included CT scan that demonstrates hepatic steatosis. Incidental renal cysts have also been seen. The patient complains of fairly chronic nausea. It is unrelated to eating. In the last week, since discontinuing her cholesterol lowering agent, nausea is somewhat improved. There's been no change other changes in medications in the last year. Gastric emptying scan in 2012 was negative. Colonoscopy in 2011 an upper endoscopy 2012 were both normal. She has mild postprandial upper abdominal discomfort. There's been no vomiting. Weight is stable.    Past Medical History  Diagnosis Date  . Hypertension   . Hyperlipidemia     a. Not on statin due to elevated liver enzymes.  . Coronary artery disease status post CABG 2005     a. s/p CABG 2005. b. 2011 - DES of SVG to PDA. c. Admitted with CP 03/2012 with stable anatomy by cath, felt noncardiac pain.  . Acute myocardial infarction, unspecified site, episode of care unspecified   . Esophageal reflux   . Diabetes mellitus   . GERD (gastroesophageal reflux disease)   . Depression   . History of cholecystectomy   . Hernia   . Elevated transaminase level     Most recently October 2012 most recent  . Kidney cysts     left  . Elevated LFTs    Past Surgical History  Procedure Laterality Date  . Bypass graft  08/2003    Quadruple   . Abdominal hysterectomy    . Cholecystectomy    . Hernia repair  01/2011   family history includes Addison's disease in her sister; Cancer in her mother; Diabetes in her mother; and Heart attack in her brother. Current Outpatient Prescriptions  Medication Sig Dispense Refill  . atenolol (TENORMIN) 50 MG tablet Take 75  mg by mouth daily.       . cloNIDine (CATAPRES) 0.2 MG tablet Take 0.2 mg by mouth at bedtime.      . furosemide (LASIX) 40 MG tablet Take 40 mg by mouth daily.        Marland Kitchen glipiZIDE (GLUCOTROL) 10 MG tablet Take 10 mg by mouth 2 (two) times daily before a meal.      . hydrocodone-acetaminophen (LORCET-HD) 5-500 MG per capsule Take 1 capsule by mouth every 6 (six) hours as needed. For pain      . losartan (COZAAR) 50 MG tablet Take 50 mg by mouth daily.       . nitroGLYCERIN (NITROSTAT) 0.4 MG SL tablet Place 1 tablet (0.4 mg total) under the tongue every 5 (five) minutes as needed (up to 3 doses). For chest pain      . pantoprazole (PROTONIX) 40 MG tablet TAKE ONE TABLET BY MOUTH EVERY DAY  30 tablet  1  . PARoxetine (PAXIL) 40 MG tablet Take 40 mg by mouth as needed.        No current facility-administered medications for this visit.   Allergies as of 09/20/2012  . (No Known Allergies)    reports that she has never smoked. She has never used smokeless tobacco. She reports that she does not drink alcohol or use illicit drugs.     Review of Systems: Pertinent positive and negative review of systems were noted in the above HPI section.  All other review of systems were otherwise negative.  Vital signs were reviewed in today's medical record Physical Exam: General: Well developed , well nourished, no acute distress Abdominal exam there is minimal tenderness in the midepigastrium to palpation. There is no guarding or rebound. There is no succussion splash.

## 2012-09-20 NOTE — Patient Instructions (Addendum)
You will go to the basement for labs today We will review your CT report once it is faxed from [facility] Call back in 2 weeks and let Dr Arlyce Dice know how your nausea is doing

## 2012-09-20 NOTE — Assessment & Plan Note (Signed)
Suspect mild transaminitis is 2 to hepatic steatosis and possibly Elita Boone. She is increased risk for the latter.  Recommendations #1 check serologies for hepatitis A, B, and C

## 2012-09-20 NOTE — Assessment & Plan Note (Addendum)
Chronic nausea is probably related to a medication effect, either singly or due to a combination. There is no evidence for gastroparesis. It is noteworthy that symptoms are slightly improved after stopping her cholesterol-lowering medication.  Recommendations #1 discontinue protonix #2 patient was instructed to call back in approximately 2 weeks. If nausea doesn't continue to improve I would consider holding glipizide.

## 2012-09-21 LAB — HEPATITIS C ANTIBODY: HCV Ab: NEGATIVE

## 2012-09-22 LAB — HEPATITIS B SURFACE ANTIBODY,QUALITATIVE: Hep B S Ab: NONREACTIVE

## 2012-09-25 LAB — HEPATITIS B E ANTIBODY: Hepatitis Be Antibody: NEGATIVE

## 2012-09-25 NOTE — Progress Notes (Signed)
Quick Note:  Please inform the patient that lab work was normal and to continue current plan of action ______ 

## 2012-09-26 ENCOUNTER — Ambulatory Visit (INDEPENDENT_AMBULATORY_CARE_PROVIDER_SITE_OTHER): Payer: Medicare Other | Admitting: Urology

## 2012-09-26 DIAGNOSIS — R1084 Generalized abdominal pain: Secondary | ICD-10-CM | POA: Diagnosis not present

## 2012-09-26 DIAGNOSIS — N281 Cyst of kidney, acquired: Secondary | ICD-10-CM | POA: Diagnosis not present

## 2012-09-27 NOTE — Progress Notes (Signed)
Quick Note:  Please inform the patient that lab work was normal and to continue current plan of action ______ 

## 2012-10-04 ENCOUNTER — Other Ambulatory Visit: Payer: Self-pay | Admitting: Cardiology

## 2012-10-04 MED ORDER — CLONIDINE HCL 0.2 MG PO TABS: 0.2000 mg | ORAL_TABLET | Freq: Every day | ORAL | Status: DC

## 2012-10-15 ENCOUNTER — Encounter (HOSPITAL_COMMUNITY): Payer: Self-pay

## 2012-10-15 ENCOUNTER — Other Ambulatory Visit: Payer: Self-pay

## 2012-10-15 ENCOUNTER — Emergency Department (HOSPITAL_COMMUNITY): Payer: Medicare Other

## 2012-10-15 ENCOUNTER — Observation Stay (HOSPITAL_COMMUNITY)
Admission: EM | Admit: 2012-10-15 | Discharge: 2012-10-16 | Disposition: A | Discharge status: Home / Self Care | Payer: Medicare Other | Attending: Internal Medicine | Admitting: Internal Medicine

## 2012-10-15 DIAGNOSIS — E785 Hyperlipidemia, unspecified: Secondary | ICD-10-CM

## 2012-10-15 DIAGNOSIS — I2510 Atherosclerotic heart disease of native coronary artery without angina pectoris: Secondary | ICD-10-CM | POA: Insufficient documentation

## 2012-10-15 DIAGNOSIS — I10 Essential (primary) hypertension: Secondary | ICD-10-CM | POA: Diagnosis not present

## 2012-10-15 DIAGNOSIS — R51 Headache: Secondary | ICD-10-CM

## 2012-10-15 DIAGNOSIS — F329 Major depressive disorder, single episode, unspecified: Secondary | ICD-10-CM

## 2012-10-15 DIAGNOSIS — E119 Type 2 diabetes mellitus without complications: Secondary | ICD-10-CM | POA: Insufficient documentation

## 2012-10-15 DIAGNOSIS — K219 Gastro-esophageal reflux disease without esophagitis: Secondary | ICD-10-CM

## 2012-10-15 DIAGNOSIS — I252 Old myocardial infarction: Secondary | ICD-10-CM | POA: Insufficient documentation

## 2012-10-15 DIAGNOSIS — R110 Nausea: Secondary | ICD-10-CM

## 2012-10-15 DIAGNOSIS — R7401 Elevation of levels of liver transaminase levels: Secondary | ICD-10-CM

## 2012-10-15 DIAGNOSIS — R209 Unspecified disturbances of skin sensation: Secondary | ICD-10-CM | POA: Diagnosis not present

## 2012-10-15 DIAGNOSIS — R079 Chest pain, unspecified: Secondary | ICD-10-CM | POA: Diagnosis not present

## 2012-10-15 DIAGNOSIS — R7309 Other abnormal glucose: Secondary | ICD-10-CM

## 2012-10-15 DIAGNOSIS — M549 Dorsalgia, unspecified: Secondary | ICD-10-CM

## 2012-10-15 DIAGNOSIS — K3189 Other diseases of stomach and duodenum: Secondary | ICD-10-CM

## 2012-10-15 DIAGNOSIS — R1319 Other dysphagia: Secondary | ICD-10-CM

## 2012-10-15 DIAGNOSIS — R0789 Other chest pain: Secondary | ICD-10-CM | POA: Diagnosis not present

## 2012-10-15 DIAGNOSIS — K7689 Other specified diseases of liver: Secondary | ICD-10-CM

## 2012-10-15 DIAGNOSIS — E669 Obesity, unspecified: Secondary | ICD-10-CM | POA: Diagnosis not present

## 2012-10-15 DIAGNOSIS — R945 Abnormal results of liver function studies: Secondary | ICD-10-CM

## 2012-10-15 LAB — CBC WITH DIFFERENTIAL/PLATELET
Eosinophils Absolute: 0.1 10*3/uL (ref 0.0–0.7)
Lymphocytes Relative: 38 % (ref 12–46)
Lymphs Abs: 1.5 10*3/uL (ref 0.7–4.0)
Neutrophils Relative %: 52 % (ref 43–77)
Platelets: 177 10*3/uL (ref 150–400)
RBC: 4.07 MIL/uL (ref 3.87–5.11)
WBC: 3.9 10*3/uL — ABNORMAL LOW (ref 4.0–10.5)

## 2012-10-15 LAB — BASIC METABOLIC PANEL
CO2: 29 mEq/L (ref 19–32)
GFR calc non Af Amer: 86 mL/min — ABNORMAL LOW (ref >90)
Glucose, Bld: 126 mg/dL — ABNORMAL HIGH (ref 70–99)
Potassium: 3.5 mEq/L (ref 3.5–5.1)
Sodium: 140 mEq/L (ref 135–145)

## 2012-10-15 LAB — GLUCOSE, CAPILLARY
Glucose-Capillary: 120 mg/dL — ABNORMAL HIGH (ref 70–99)
Glucose-Capillary: 168 mg/dL — ABNORMAL HIGH (ref 70–99)

## 2012-10-15 MED ORDER — ONDANSETRON HCL 4 MG/2ML IJ SOLN
4.0000 mg | INTRAMUSCULAR | Status: DC | PRN
  Filled 2012-10-15: qty 2

## 2012-10-15 MED ORDER — BIOTENE DRY MOUTH MT LIQD
15.0000 mL | Freq: Two times a day (BID) | OROMUCOSAL | Status: DC
  Administered 2012-10-15 – 2012-10-16 (×2): 15 mL via OROMUCOSAL

## 2012-10-15 MED ORDER — NAPROXEN SODIUM 220 MG PO TABS: 220.0000 mg | ORAL_TABLET | Freq: Two times a day (BID) | ORAL | Status: DC | PRN

## 2012-10-15 MED ORDER — ATENOLOL 25 MG PO TABS
75.0000 mg | ORAL_TABLET | Freq: Every day | ORAL | Status: DC
  Administered 2012-10-15 – 2012-10-16 (×2): 75 mg via ORAL
  Filled 2012-10-15 (×2): qty 3

## 2012-10-15 MED ORDER — NITROGLYCERIN 0.4 MG/SPRAY TL SOLN
1.0000 | Status: DC | PRN
  Filled 2012-10-15: qty 4.9

## 2012-10-15 MED ORDER — ONDANSETRON HCL 4 MG/2ML IJ SOLN
4.0000 mg | Freq: Four times a day (QID) | INTRAMUSCULAR | Status: DC | PRN
  Administered 2012-10-15: 4 mg via INTRAVENOUS
  Filled 2012-10-15: qty 2

## 2012-10-15 MED ORDER — MORPHINE SULFATE 4 MG/ML IJ SOLN
4.0000 mg | INTRAMUSCULAR | Status: DC | PRN
  Administered 2012-10-15: 4 mg via INTRAVENOUS
  Filled 2012-10-15: qty 1

## 2012-10-15 MED ORDER — CLONIDINE HCL 0.2 MG PO TABS
0.2000 mg | ORAL_TABLET | Freq: Every day | ORAL | Status: DC
  Administered 2012-10-15: 0.2 mg via ORAL
  Filled 2012-10-15: qty 1

## 2012-10-15 MED ORDER — NAPROXEN 250 MG PO TABS: 250.0000 mg | ORAL_TABLET | Freq: Two times a day (BID) | ORAL | Status: DC | PRN

## 2012-10-15 MED ORDER — ASPIRIN 81 MG PO CHEW
324.0000 mg | CHEWABLE_TABLET | Freq: Once | ORAL | Status: AC
  Administered 2012-10-15: 324 mg via ORAL
  Filled 2012-10-15: qty 4

## 2012-10-15 MED ORDER — NITROGLYCERIN 0.4 MG SL SUBL: 0.4000 mg | SUBLINGUAL_TABLET | SUBLINGUAL | Status: DC | PRN

## 2012-10-15 MED ORDER — SODIUM CHLORIDE 0.9 % IJ SOLN: 3.0000 mL | Freq: Two times a day (BID) | INTRAMUSCULAR | Status: DC

## 2012-10-15 MED ORDER — HEPARIN SODIUM (PORCINE) 5000 UNIT/ML IJ SOLN
5000.0000 [IU] | Freq: Three times a day (TID) | INTRAMUSCULAR | Status: DC
  Administered 2012-10-15 – 2012-10-16 (×3): 5000 [IU] via SUBCUTANEOUS
  Filled 2012-10-15 (×3): qty 1

## 2012-10-15 MED ORDER — GI COCKTAIL ~~LOC~~
30.0000 mL | Freq: Once | ORAL | Status: AC
  Administered 2012-10-15: 30 mL via ORAL
  Filled 2012-10-15: qty 30

## 2012-10-15 MED ORDER — FAMOTIDINE IN NACL 20-0.9 MG/50ML-% IV SOLN
20.0000 mg | Freq: Once | INTRAVENOUS | Status: AC
  Administered 2012-10-15: 20 mg via INTRAVENOUS
  Filled 2012-10-15: qty 50

## 2012-10-15 MED ORDER — GLIPIZIDE 5 MG PO TABS
10.0000 mg | ORAL_TABLET | Freq: Two times a day (BID) | ORAL | Status: DC | PRN
  Filled 2012-10-15: qty 1

## 2012-10-15 MED ORDER — LOSARTAN POTASSIUM 50 MG PO TABS
50.0000 mg | ORAL_TABLET | Freq: Every day | ORAL | Status: DC
  Administered 2012-10-16: 50 mg via ORAL
  Filled 2012-10-15: qty 1

## 2012-10-15 MED ORDER — FUROSEMIDE 40 MG PO TABS
40.0000 mg | ORAL_TABLET | Freq: Every day | ORAL | Status: DC
  Administered 2012-10-16: 40 mg via ORAL
  Filled 2012-10-15: qty 1

## 2012-10-15 MED ORDER — FUROSEMIDE 40 MG PO TABS: 40.0000 mg | ORAL_TABLET | Freq: Every day | ORAL | Status: DC

## 2012-10-15 MED ORDER — ASPIRIN EC 81 MG PO TBEC: 81.0000 mg | DELAYED_RELEASE_TABLET | Freq: Every day | ORAL | Status: DC | PRN

## 2012-10-15 MED ORDER — ONDANSETRON HCL 4 MG PO TABS: 4.0000 mg | ORAL_TABLET | Freq: Four times a day (QID) | ORAL | Status: DC | PRN

## 2012-10-15 MED ORDER — MORPHINE SULFATE 2 MG/ML IJ SOLN
2.0000 mg | INTRAMUSCULAR | Status: DC | PRN
  Administered 2012-10-15: 2 mg via INTRAVENOUS
  Filled 2012-10-15: qty 1

## 2012-10-15 MED ORDER — NITROGLYCERIN 0.4 MG SL SUBL
0.4000 mg | SUBLINGUAL_TABLET | SUBLINGUAL | Status: AC | PRN
  Administered 2012-10-15 (×3): 0.4 mg via SUBLINGUAL
  Filled 2012-10-15: qty 75

## 2012-10-15 MED ORDER — HYDROCODONE-ACETAMINOPHEN 5-325 MG PO TABS: 1.0000 | ORAL_TABLET | ORAL | Status: DC | PRN

## 2012-10-15 MED ORDER — SODIUM CHLORIDE 0.9 % IV SOLN
Freq: Once | INTRAVENOUS | Status: AC
  Administered 2012-10-15: 16:00:00 via INTRAVENOUS

## 2012-10-15 NOTE — ED Notes (Addendum)
Pt c/o left side chest pain described as an ache that started a week ago, pain has been intermittent at times but today pain became worse and radiated to left arm with numbness to left hand. Pt did take one nitro at home today with relief in pain, pt states that she decided to come to er because the pain has returned.

## 2012-10-15 NOTE — ED Notes (Signed)
MD at bedside. 

## 2012-10-15 NOTE — ED Provider Notes (Signed)
History     CSN: [id number]  Arrival date & time 10/15/12  1301   First MD Initiated Contact with Patient 10/15/12 1357      Chief Complaint  Patient presents with  . Chest Pain     HPI Pt was seen at 1420.   Per pt, c/o gradual onset and persistence of multiple intermittent episodes of chest discomfort for the past 1 week.  States her chest discomfort became worse today, with radiation into her left arm.  Has been associated with diaphoresis.  Pt states she has chronic nausea and "all my meds were stopped" by her PMD approx 3 weeks ago for same.  Pt states her symptoms generally improve when she rests.  She took ASA 81mg  and her own SL ntg with transient improvement in symptoms.  Denies palpitations, no SOB/cough, no abd pain, no back pain, no vomiting/diarrhea, no fevers, no rash, no focal motor weakness.     Past Medical History  Diagnosis Date  . Hypertension   . Hyperlipidemia     a. Not on statin due to elevated liver enzymes.  . Coronary artery disease status post CABG 2005     a. s/p CABG 2005. b. 2011 - DES of SVG to PDA. c. Admitted with CP 03/2012 with stable anatomy by cath, felt noncardiac pain.  . Acute myocardial infarction, unspecified site, episode of care unspecified   . Esophageal reflux   . Diabetes mellitus   . GERD (gastroesophageal reflux disease)   . Depression   . History of cholecystectomy   . Hernia   . Elevated transaminase level     Most recently October 2012 most recent  . Kidney cysts     left  . Elevated LFTs     Past Surgical History  Procedure Laterality Date  . Bypass graft  08/2003    Quadruple   . Abdominal hysterectomy    . Cholecystectomy    . Hernia repair  01/2011    Family History  Problem Relation Age of Onset  . Cancer Mother     stomach  . Diabetes Mother   . Heart attack Brother   . Addison's disease Sister     History  Substance Use Topics  . Smoking status: Never Smoker   . Smokeless tobacco: Never Used  .  Alcohol Use: No    Review of Systems ROS: Statement: All systems negative except as marked or noted in the HPI; Constitutional: Negative for fever and chills. ; ; Eyes: Negative for eye pain, redness and discharge. ; ; ENMT: Negative for ear pain, hoarseness, nasal congestion, sinus pressure and sore throat. ; ; Cardiovascular: +CP, diaphoresis.  Negative for palpitations, dyspnea and peripheral edema. ; ; Respiratory: Negative for cough, wheezing and stridor. ; ; Gastrointestinal: +chronic nausea. Negative for vomiting, diarrhea, abdominal pain, blood in stool, hematemesis, jaundice and rectal bleeding. . ; ; Genitourinary: Negative for dysuria, flank pain and hematuria. ; ; Musculoskeletal: Negative for back pain and neck pain. Negative for swelling and trauma.; ; Skin: Negative for pruritus, rash, abrasions, blisters, bruising and skin lesion.; ; Neuro: Negative for headache, lightheadedness and neck stiffness. Negative for weakness, altered level of consciousness , altered mental status, extremity weakness, paresthesias, involuntary movement, seizure and syncope.      Allergies  Review of patient's allergies indicates no known allergies.  Home Medications   Current Outpatient Rx  Name  Route  Sig  Dispense  Refill  . aspirin EC 81 MG tablet  Oral   Take 81 mg by mouth daily as needed (Help with circulation).         Marland Kitchen atenolol (TENORMIN) 50 MG tablet   Oral   Take 75 mg by mouth daily.          . cloNIDine (CATAPRES) 0.2 MG tablet   Oral   Take 1 tablet (0.2 mg total) by mouth at bedtime.   30 tablet   12   . furosemide (LASIX) 40 MG tablet   Oral   Take 40 mg by mouth daily.           Marland Kitchen glipiZIDE (GLUCOTROL) 10 MG tablet   Oral   Take 10 mg by mouth 2 (two) times daily as needed (When sugar level gets high.).         . HYDROcodone-acetaminophen (VICODIN) 5-500 MG per tablet   Oral   Take 1 tablet by mouth every 6 (six) hours as needed for pain.         Marland Kitchen  losartan (COZAAR) 50 MG tablet   Oral   Take 50 mg by mouth daily.          . naproxen sodium (ALEVE) 220 MG tablet   Oral   Take 220 mg by mouth 2 (two) times daily as needed (Pain).         . nitroGLYCERIN (NITROSTAT) 0.4 MG SL tablet   Sublingual   Place 1 tablet (0.4 mg total) under the tongue every 5 (five) minutes as needed (up to 3 doses). For chest pain           BP 163/72  Pulse 61  Temp(Src) 98.3 F (36.8 C) (Oral)  Resp 16  Ht 5\' 3"  (1.6 m)  Wt 169 lb (76.658 kg)  BMI 29.94 kg/m2  SpO2 96%  Physical Exam 1425: Physical examination:  Nursing notes reviewed; Vital signs and O2 SAT reviewed;  Constitutional: Well developed, Well nourished, Well hydrated, In no acute distress; Head:  Normocephalic, atraumatic; Eyes: EOMI, PERRL, No scleral icterus; ENMT: Mouth and pharynx normal, Mucous membranes moist; Neck: Supple, Full range of motion, No lymphadenopathy; Cardiovascular: Regular rate and rhythm, No gallop; Respiratory: Breath sounds clear & equal bilaterally, No rales, rhonchi, wheezes.  Speaking full sentences with ease, Normal respiratory effort/excursion; Chest: Nontender, Movement normal; Abdomen: Soft, Nontender, Nondistended, Normal bowel sounds;; Extremities: Pulses normal, No tenderness, No edema, No calf edema or asymmetry.; Neuro: AA&Ox3, Major CN grossly intact.  Speech clear. No gross focal motor or sensory deficits in extremities.; Skin: Color normal, Warm, Dry.   ED Course  Procedures    MDM  MDM Reviewed: previous chart, nursing note and vitals Reviewed previous: ECG Interpretation: ECG, labs and x-ray    Date: 10/15/2012  Rate: 53  Rhythm: normal sinus rhythm  QRS Axis: normal  Intervals: normal  ST/T Wave abnormalities: nonspecific ST/T changes  Conduction Disutrbances:none  Narrative Interpretation:   Old EKG Reviewed: unchanged; no significant changes from previous EKG dated 04/04/2012.  Results for orders placed during the hospital  encounter of 10/15/12  CBC WITH DIFFERENTIAL      Result Value Range   WBC 3.9 (*) 4.0 - 10.5 K/uL   RBC 4.07  3.87 - 5.11 MIL/uL   Hemoglobin 13.0  12.0 - 15.0 g/dL   HCT 16.1  09.6 - 04.5 %   MCV 89.2  78.0 - 100.0 fL   MCH 31.9  26.0 - 34.0 pg   MCHC 35.8  30.0 - 36.0 g/dL  RDW 12.4  11.5 - 15.5 %   Platelets 177  150 - 400 K/uL   Neutrophils Relative 52  43 - 77 %   Neutro Abs 2.0  1.7 - 7.7 K/uL   Lymphocytes Relative 38  12 - 46 %   Lymphs Abs 1.5  0.7 - 4.0 K/uL   Monocytes Relative 7  3 - 12 %   Monocytes Absolute 0.3  0.1 - 1.0 K/uL   Eosinophils Relative 2  0 - 5 %   Eosinophils Absolute 0.1  0.0 - 0.7 K/uL   Basophils Relative 1  0 - 1 %   Basophils Absolute 0.0  0.0 - 0.1 K/uL  BASIC METABOLIC PANEL      Result Value Range   Sodium 140  135 - 145 mEq/L   Potassium 3.5  3.5 - 5.1 mEq/L   Chloride 102  96 - 112 mEq/L   CO2 29  19 - 32 mEq/L   Glucose, Bld 126 (*) 70 - 99 mg/dL   BUN 12  6 - 23 mg/dL   Creatinine, Ser 8.65  0.50 - 1.10 mg/dL   Calcium 9.9  8.4 - 78.4 mg/dL   GFR calc non Af Amer 86 (*) >90 mL/min   GFR calc Af Amer >90  >90 mL/min  TROPONIN I      Result Value Range   Troponin I <0.30  <0.30 ng/mL   Dg Chest Portable 1 View 10/15/2012  *RADIOLOGY REPORT*  Clinical Data: Chest pain.  Left arm numbness.  PORTABLE CHEST - 1 VIEW  Comparison: PA and lateral chest 04/04/2012.  Findings: The patient is status post CABG.  There is cardiomegaly without edema.  No pneumothorax or pleural effusion.  No focal airspace disease.  IMPRESSION: Cardiomegaly without acute disease.   Original Report Authenticated By: Holley Dexter, M.D.     1510:  Pt's discomfort improved after ASA and ntg SL here.  Pt has multiple cardiac risk factors; will admit.  Dx and testing d/w pt and family.  Questions answered.  Verb understanding, agreeable to admit.  T/C to [facility] Dr. Karilyn Cota, case discussed, including:  HPI, pertinent PM/SHx, VS/PE, dx testing, ED course and treatment:   Agreeable to observation admit, requests to obtain tele bed to team 1; he will come to ED for eval.            Laray Anger, DO 10/17/12 1313

## 2012-10-15 NOTE — ED Notes (Signed)
Pt pain free after nitro in er.

## 2012-10-15 NOTE — ED Notes (Addendum)
Pt took one 81mg  aspirin at 7am today,

## 2012-10-15 NOTE — ED Notes (Signed)
Prior to gi cocktail pt reports that she is experiencing pain in left arm, pain is rated as 1 on pain scale.

## 2012-10-15 NOTE — ED Notes (Signed)
Pt states that the first nitro is started to "ease" her pain

## 2012-10-15 NOTE — ED Notes (Signed)
Dr. Karilyn Cota at bedside talking with pt and family

## 2012-10-15 NOTE — ED Notes (Signed)
Pt reports mid-sternal cp that moves down her left arm at times, also moves to mid back area at times, dneis any sob, +nausea at times. (has chronic nausea and pmd has stopped her meds).  Took 1 of her own sl ntg today at home and the pain stopped.

## 2012-10-15 NOTE — ED Notes (Signed)
Pt still reports pain in left arm as a 2-3 on pain scale, Dr. Clarene Duke notified, additional orders given

## 2012-10-15 NOTE — H&P (Signed)
[facility] History and Physical  [name]:[id number] DOB: 20-Oct-1945 DOA: 10/15/2012  Referring physician: Dr. Clarene Duke, ER physician. PCP: Juliette Alcide, MD    Chief Complaint: Left arm pain. Chest pain.  HPI: [name] is a 67 y.o. female has a history of coronary artery disease, having had CABG in 2005 and repeated cardiac catheterizations for chest pain. Her last cardiac catheterization was in August 2013 whereupon the grafts were found to be patent. She had normal ejection fraction at that time. She now comes with a 10 day history of left arm pain primarily associated on occasion with mild central chest pain. The symptoms usually last 15-20 minutes, sometimes associated with dyspnea. She thinks that the chest pain in particular is similar to pain she experienced with her myocardial infarction many years ago. However, she has had similar symptoms in August of 2013 when she was studied with cardiac catheterization. At that time medical therapy was recommended.   Review of Systems:   Apart from history of present illness, other systems negative.  Past Medical History  Diagnosis Date  . Hypertension   . Hyperlipidemia     a. Not on statin due to elevated liver enzymes.  . Coronary artery disease status post CABG 2005     a. s/p CABG 2005. b. 2011 - DES of SVG to PDA. c. Admitted with CP 03/2012 with stable anatomy by cath, felt noncardiac pain.  . Acute myocardial infarction, unspecified site, episode of care unspecified   . Esophageal reflux   . Diabetes mellitus   . GERD (gastroesophageal reflux disease)   . Depression   . History of cholecystectomy   . Hernia   . Elevated transaminase level     Most recently October 2012 most recent  . Kidney cysts     left  . Elevated LFTs    Past Surgical History  Procedure Laterality Date  . Bypass graft  08/2003    Quadruple   . Abdominal hysterectomy    . Cholecystectomy    . Hernia repair  01/2011   Social  History:  She is married and lives with her husband. She does not smoke. She does not drink alcohol. She is fully active.  No Known Allergies  Family History  Problem Relation Age of Onset  . Cancer Mother     stomach  . Diabetes Mother   . Heart attack Brother   . Addison's disease Sister       Prior to Admission medications   Medication Sig Start Date End Date Taking? Authorizing Provider  aspirin EC 81 MG tablet Take 81 mg by mouth daily as needed (Help with circulation).   Yes Historical Provider, MD  atenolol (TENORMIN) 50 MG tablet Take 75 mg by mouth daily.    Yes Historical Provider, MD  cloNIDine (CATAPRES) 0.2 MG tablet Take 1 tablet (0.2 mg total) by mouth at bedtime. 10/04/12  Yes Lewayne Bunting, MD  furosemide (LASIX) 40 MG tablet Take 40 mg by mouth daily.     Yes Historical Provider, MD  glipiZIDE (GLUCOTROL) 10 MG tablet Take 10 mg by mouth 2 (two) times daily as needed (When sugar level gets high.).   Yes Historical Provider, MD  HYDROcodone-acetaminophen (VICODIN) 5-500 MG per tablet Take 1 tablet by mouth every 6 (six) hours as needed for pain.   Yes Historical Provider, MD  losartan (COZAAR) 50 MG tablet Take 50 mg by mouth daily.  07/30/12  Yes Historical Provider, MD  naproxen  sodium (ALEVE) 220 MG tablet Take 220 mg by mouth 2 (two) times daily as needed (Pain).   Yes Historical Provider, MD  nitroGLYCERIN (NITROSTAT) 0.4 MG SL tablet Place 1 tablet (0.4 mg total) under the tongue every 5 (five) minutes as needed (up to 3 doses). For chest pain 04/05/12  Yes Laurann Montana, PA-C   Physical Exam: Filed Vitals:   10/15/12 1305 10/15/12 1411 10/15/12 1422 10/15/12 1541  BP: 174/78 143/65 163/72 142/86  Pulse: 57 53 61 65  Temp: 98.3 F (36.8 C)     TempSrc: Oral     Resp: 18 20 16 20   Height: 5\' 3"  (1.6 m)     Weight: 76.658 kg (169 lb)     SpO2: 99% 96% 96% 98%     General:  She looks systemically well. She is not in acute pain.  Eyes: No jaundice. No  pallor.  ENT: No major abnormalities.  Neck: No lymphadenopathy.  Cardiovascular: Heart sounds are present, sinus bradycardia. No gallop rhythm. No murmurs. Jugular venous pressure not elevated.  Respiratory: Lung fields are clear.  Abdomen: Soft, nontender. No masses.  Skin: No rash.  Musculoskeletal: No anterior chest wall tenderness. Movement of the left arm is without any pain.  Psychiatric: Appropriate affect.  Neurologic: Alert and orientated. No focal neurological signs.  Labs on Admission:  Basic Metabolic Panel:  Recent Labs Lab 10/15/12 1342  NA 140  K 3.5  CL 102  CO2 29  GLUCOSE 126*  BUN 12  CREATININE 0.75  CALCIUM 9.9      CBC:  Recent Labs Lab 10/15/12 1342  WBC 3.9*  NEUTROABS 2.0  HGB 13.0  HCT 36.3  MCV 89.2  PLT 177   Cardiac Enzymes:  Recent Labs Lab 10/15/12 1342  TROPONINI <0.30     Radiological Exams on Admission: Dg Chest Portable 1 View  10/15/2012  *RADIOLOGY REPORT*  Clinical Data: Chest pain.  Left arm numbness.  PORTABLE CHEST - 1 VIEW  Comparison: PA and lateral chest 04/04/2012.  Findings: The patient is status post CABG.  There is cardiomegaly without edema.  No pneumothorax or pleural effusion.  No focal airspace disease.  IMPRESSION: Cardiomegaly without acute disease.   Original Report Authenticated By: Holley Dexter, M.D.     EKG: Independently reviewed. Normal sinus rhythm, no acute ST-T wave changes.  Assessment/Plan   1. Chest pain, possible cardiac pain. History of multiple cardiac catheterizations and decision for medical therapy alone at the present time. 2. Hypertension. 3. Type 2 diabetes mellitus. 4. Hyperlipidemia. 5. Obesity.  Plan: 1. Admit to telemetry. 2. Serial cardiac enzymes. 3. Treat symptomatically for the time being. 4. Consider cardiology consultation although I think this can be addressed as an outpatient. I've discussed this patient with cardiology, Dr. Patty Sermons, who was on  call, he recommends outpatient evaluation after discharge. He does not feel that the patient needs acute transfer to [facility] for any acute intervention at this point.   Code Status: Full code.   Family Communication: Discussed plan with patient at the bedside.   Disposition Plan: Home when medically stable.  Time spent: 45 minutes.  Wilson Singer [facility] Pager [phone].  If 7PM-7AM, please contact night-coverage [email or web address] Password [facility] 10/15/2012, 3:46 PM

## 2012-10-15 NOTE — ED Notes (Signed)
Report given to rn

## 2012-10-16 DIAGNOSIS — E785 Hyperlipidemia, unspecified: Secondary | ICD-10-CM

## 2012-10-16 DIAGNOSIS — F329 Major depressive disorder, single episode, unspecified: Secondary | ICD-10-CM | POA: Diagnosis not present

## 2012-10-16 DIAGNOSIS — I2510 Atherosclerotic heart disease of native coronary artery without angina pectoris: Secondary | ICD-10-CM | POA: Diagnosis not present

## 2012-10-16 DIAGNOSIS — R079 Chest pain, unspecified: Secondary | ICD-10-CM | POA: Diagnosis not present

## 2012-10-16 DIAGNOSIS — K219 Gastro-esophageal reflux disease without esophagitis: Secondary | ICD-10-CM

## 2012-10-16 DIAGNOSIS — I10 Essential (primary) hypertension: Secondary | ICD-10-CM | POA: Diagnosis not present

## 2012-10-16 LAB — GLUCOSE, CAPILLARY: Glucose-Capillary: 130 mg/dL — ABNORMAL HIGH (ref 70–99)

## 2012-10-16 NOTE — Progress Notes (Signed)
Discharge instructions given with no questions. Patient ambulated out of facility with husband.

## 2012-10-16 NOTE — Progress Notes (Signed)
Utilization Review Complete  

## 2012-10-16 NOTE — Discharge Summary (Signed)
Physician Discharge Summary  [name]:[id number] DOB: [birth year] DOA: 10/15/2012  PCP: Juliette Alcide, MD  Admit date: 10/15/2012 Discharge date: 10/16/2012  Time spent: Greater than 30 minutes  Recommendations for Outpatient Follow-up:  1. Followup with cardiology, Dr. Jens Som soon. She may require stress testing.   Discharge Diagnoses:  1. Chest pain, no evidence of myocardial ischemia or infarction. 2. Hypertension. 3. Coronary artery disease. 4. Type 2 diabetes mellitus. 5. Obesity.   Discharge Condition: Stable.  Diet recommendation: Carbohydrate modified diet.  Filed Weights   10/15/12 1305  Weight: 76.658 kg (169 lb)    History of present illness:  This 67 year old lady came to the hospital yesterday with history of left arm pain and chest pain. Please see initial history as outlined below HPI: [name] is a 67 y.o. female has a history of coronary artery disease, having had CABG in 2005 and repeated cardiac catheterizations for chest pain. Her last cardiac catheterization was in August 2013 whereupon the grafts were found to be patent. She had normal ejection fraction at that time. She now comes with a 10 day history of left arm pain primarily associated on occasion with mild central chest pain. The symptoms usually last 15-20 minutes, sometimes associated with dyspnea. She thinks that the chest pain in particular is similar to pain she experienced with her myocardial infarction many years ago. However, she has had similar symptoms in August of 2013 when she was studied with cardiac catheterization. At that time medical therapy was recommended.  Hospital Course:  The patient was admitted overnight and serial cardiac enzymes were negative. She did have a couple of episodes of vomiting but she feels much improved now. She does have a history of hiatal hernia and GERD. Her chest pain and arm pain have resolved. She stable for discharge. I did discuss with Dr.  Patty Sermons, cardiology, regarding her admission yesterday and he felt that she could be managed as an outpatient for early followup with her primary cardiologist, Dr. Jens Som. We will arrange this. Procedures:  None.   Consultations:  None.  Discharge Exam: Filed Vitals:   10/15/12 1623 10/15/12 2121 10/16/12 0624 10/16/12 0804  BP: 150/80 152/67 124/68   Pulse: 58 54 63   Temp:  98.8 F (37.1 C) 97.3 F (36.3 C)   TempSrc:  Oral Oral   Resp: 16 18 19    Height:      Weight:      SpO2: 100% 97% 98% 97%    General: She looks systemically well. Cardiovascular: Heart sounds are present without gallop rhythm. Respiratory: Lung fields are clear. She is alert and orientated .  Discharge Instructions  Discharge Orders   Future Orders Complete By Expires     Diet - low sodium heart healthy  As directed     Increase activity slowly  As directed         Medication List    TAKE these medications       ALEVE 220 MG tablet  Generic drug:  naproxen sodium  Take 220 mg by mouth 2 (two) times daily as needed (Pain).     aspirin EC 81 MG tablet  Take 81 mg by mouth daily as needed (Help with circulation).     atenolol 50 MG tablet  Commonly known as:  TENORMIN  Take 75 mg by mouth daily.     cloNIDine 0.2 MG tablet  Commonly known as:  CATAPRES  Take 1 tablet (0.2 mg total) by mouth at  bedtime.     furosemide 40 MG tablet  Commonly known as:  LASIX  Take 40 mg by mouth daily.     glipiZIDE 10 MG tablet  Commonly known as:  GLUCOTROL  Take 10 mg by mouth 2 (two) times daily as needed (When sugar level gets high.).     HYDROcodone-acetaminophen 5-500 MG per tablet  Commonly known as:  VICODIN  Take 1 tablet by mouth every 6 (six) hours as needed for pain.     losartan 50 MG tablet  Commonly known as:  COZAAR  Take 50 mg by mouth daily.     nitroGLYCERIN 0.4 MG SL tablet  Commonly known as:  NITROSTAT  Place 1 tablet (0.4 mg total) under the tongue every 5 (five)  minutes as needed (up to 3 doses). For chest pain          The results of significant diagnostics from this hospitalization (including imaging, microbiology, ancillary and laboratory) are listed below for reference.    Significant Diagnostic Studies: Dg Chest Portable 1 View  10/15/2012  *RADIOLOGY REPORT*  Clinical Data: Chest pain.  Left arm numbness.  PORTABLE CHEST - 1 VIEW  Comparison: PA and lateral chest 04/04/2012.  Findings: The patient is status post CABG.  There is cardiomegaly without edema.  No pneumothorax or pleural effusion.  No focal airspace disease.  IMPRESSION: Cardiomegaly without acute disease.   Original Report Authenticated By: Holley Dexter, M.D.         Labs: Basic Metabolic Panel:  Recent Labs Lab 10/15/12 1342  NA 140  K 3.5  CL 102  CO2 29  GLUCOSE 126*  BUN 12  CREATININE 0.75  CALCIUM 9.9       CBC:  Recent Labs Lab 10/15/12 1342  WBC 3.9*  NEUTROABS 2.0  HGB 13.0  HCT 36.3  MCV 89.2  PLT 177   Cardiac Enzymes:  Recent Labs Lab 10/15/12 1342 10/15/12 1856 10/16/12 0533  TROPONINI <0.30 <0.30 <0.30    CBG:  Recent Labs Lab 10/15/12 1701 10/15/12 2102 10/16/12 0731 10/16/12 1149  GLUCAP 120* 168* 130* 215*       Signed:  GOSRANI,NIMISH C  [facility] 10/16/2012, 12:05 PM

## 2012-10-17 ENCOUNTER — Telehealth: Payer: Self-pay | Admitting: Cardiology

## 2012-10-17 NOTE — Telephone Encounter (Signed)
Pt offered sooner appt with Dr. Jens Som.  Pt appreciative.

## 2012-10-17 NOTE — Telephone Encounter (Signed)
New problem     Patient is asking for stress test prior to appt in April

## 2012-11-07 ENCOUNTER — Ambulatory Visit (INDEPENDENT_AMBULATORY_CARE_PROVIDER_SITE_OTHER): Payer: Medicare Other | Admitting: Cardiology

## 2012-11-07 ENCOUNTER — Encounter: Payer: Self-pay | Admitting: Cardiology

## 2012-11-07 VITALS — BP 195/80 | HR 49 | Ht 63.0 in | Wt 168.8 lb

## 2012-11-07 DIAGNOSIS — E785 Hyperlipidemia, unspecified: Secondary | ICD-10-CM | POA: Diagnosis not present

## 2012-11-07 DIAGNOSIS — R079 Chest pain, unspecified: Secondary | ICD-10-CM

## 2012-11-07 DIAGNOSIS — I10 Essential (primary) hypertension: Secondary | ICD-10-CM | POA: Diagnosis not present

## 2012-11-07 DIAGNOSIS — I2510 Atherosclerotic heart disease of native coronary artery without angina pectoris: Secondary | ICD-10-CM | POA: Diagnosis not present

## 2012-11-07 NOTE — Assessment & Plan Note (Signed)
Most recent catheterization shows patent grafts. Continue aspirin. She declines statin.

## 2012-11-07 NOTE — Assessment & Plan Note (Signed)
Problems with chronic chest pain; Most recent cath shows patent grafts. No recent symptoms.

## 2012-11-07 NOTE — Assessment & Plan Note (Signed)
Continue diet. Patient declines statins. 

## 2012-11-07 NOTE — Assessment & Plan Note (Signed)
Blood pressure is elevated but she states is typically controlled at home. We will follow and adjust regimen as indicated.

## 2012-11-07 NOTE — Progress Notes (Signed)
HPI: [name] is a pleasant female who has a history of coronary artery disease status post coronary bypassing graft in 2005. She has had prior PCI of the saphenous vein graft to the right coronary artery. He has had problems with recurrent chest pain. Cardiac catheterization in August of 2013 revealed occlusion of all 3 native or graft. The saphenous vein graft to the first and second marginal was patent. The saphenous vein graft to the PDA was patent. The LIMA to the LAD was patent. Ejection fraction 55-65%. Patient admitted again in March of 2014 with chest pain and enzymes were negative. Since then, the patient denies any dyspnea on exertion, orthopnea, PND, pedal edema, palpitations, syncope or chest pain.   Current Outpatient Prescriptions  Medication Sig Dispense Refill  . aspirin EC 81 MG tablet Take 81 mg by mouth daily as needed (Help with circulation).      Marland Kitchen atenolol (TENORMIN) 50 MG tablet Take 75 mg by mouth daily.       . cloNIDine (CATAPRES) 0.2 MG tablet Take 1 tablet (0.2 mg total) by mouth at bedtime.  30 tablet  12  . furosemide (LASIX) 40 MG tablet Take 40 mg by mouth daily.        Marland Kitchen HYDROcodone-acetaminophen (VICODIN) 5-500 MG per tablet Take 1 tablet by mouth every 6 (six) hours as needed for pain.      Marland Kitchen losartan (COZAAR) 50 MG tablet Take 50 mg by mouth daily.       . nitroGLYCERIN (NITROSTAT) 0.4 MG SL tablet Place 1 tablet (0.4 mg total) under the tongue every 5 (five) minutes as needed (up to 3 doses). For chest pain       No current facility-administered medications for this visit.     Past Medical History  Diagnosis Date  . Hypertension   . Hyperlipidemia     a. Not on statin due to elevated liver enzymes.  . Coronary artery disease status post CABG 2005     a. s/p CABG 2005. b. 2011 - DES of SVG to PDA. c. Admitted with CP 03/2012 with stable anatomy by cath, felt noncardiac pain.  . Acute myocardial infarction, unspecified site, episode of care  unspecified   . Esophageal reflux   . Diabetes mellitus   . GERD (gastroesophageal reflux disease)   . Depression   . History of cholecystectomy   . Hernia   . Elevated transaminase level     Most recently October 2012 most recent  . Kidney cysts     left  . Elevated LFTs     Past Surgical History  Procedure Laterality Date  . Bypass graft  08/2003    Quadruple   . Abdominal hysterectomy    . Cholecystectomy    . Hernia repair  01/2011    History   Social History  . Marital Status: Married    Spouse Name: N/A    Number of Children: 2  . Years of Education: N/A   Occupational History  . Part-Time hair-dresser    Social History Main Topics  . Smoking status: Never Smoker   . Smokeless tobacco: Never Used  . Alcohol Use: No  . Drug Use: No  . Sexually Active:    Other Topics Concern  . Not on file   Social History Narrative  . No narrative on file    ROS: no fevers or chills, productive cough, hemoptysis, dysphasia, odynophagia, melena, hematochezia, dysuria, hematuria, rash, seizure activity, orthopnea, PND, pedal edema, claudication.  Remaining systems are negative.  Physical Exam: Well-developed well-nourished in no acute distress.  Skin is warm and dry.  HEENT is normal.  Neck is supple.  Chest is clear to auscultation with normal expansion.  Cardiovascular exam is regular rate and rhythm.  Abdominal exam nontender or distended. No masses palpated. Extremities show no edema. neuro grossly intact

## 2012-11-07 NOTE — Patient Instructions (Addendum)
Your physician wants you to follow-up in: 6 MONTHS WITH DR CRENSHAW You will receive a reminder letter in the mail two months in advance. If you don't receive a letter, please call our office to schedule the follow-up appointment.  

## 2012-12-04 ENCOUNTER — Ambulatory Visit: Payer: Medicare Other | Admitting: Cardiology

## 2013-01-15 DIAGNOSIS — F329 Major depressive disorder, single episode, unspecified: Secondary | ICD-10-CM | POA: Diagnosis not present

## 2013-01-15 DIAGNOSIS — Z0000 Encounter for general adult medical examination without abnormal findings: Secondary | ICD-10-CM | POA: Diagnosis not present

## 2013-01-15 DIAGNOSIS — K219 Gastro-esophageal reflux disease without esophagitis: Secondary | ICD-10-CM | POA: Diagnosis not present

## 2013-01-15 DIAGNOSIS — E119 Type 2 diabetes mellitus without complications: Secondary | ICD-10-CM | POA: Diagnosis not present

## 2013-01-15 DIAGNOSIS — I10 Essential (primary) hypertension: Secondary | ICD-10-CM | POA: Diagnosis not present

## 2013-01-15 DIAGNOSIS — R1084 Generalized abdominal pain: Secondary | ICD-10-CM | POA: Diagnosis not present

## 2013-01-15 DIAGNOSIS — E782 Mixed hyperlipidemia: Secondary | ICD-10-CM | POA: Diagnosis not present

## 2013-01-16 DIAGNOSIS — M545 Low back pain: Secondary | ICD-10-CM | POA: Diagnosis not present

## 2013-01-16 DIAGNOSIS — E782 Mixed hyperlipidemia: Secondary | ICD-10-CM | POA: Diagnosis not present

## 2013-01-16 DIAGNOSIS — E119 Type 2 diabetes mellitus without complications: Secondary | ICD-10-CM | POA: Diagnosis not present

## 2013-01-16 DIAGNOSIS — K219 Gastro-esophageal reflux disease without esophagitis: Secondary | ICD-10-CM | POA: Diagnosis not present

## 2013-01-16 DIAGNOSIS — I10 Essential (primary) hypertension: Secondary | ICD-10-CM | POA: Diagnosis not present

## 2013-04-04 DIAGNOSIS — Z1231 Encounter for screening mammogram for malignant neoplasm of breast: Secondary | ICD-10-CM | POA: Diagnosis not present

## 2013-04-04 DIAGNOSIS — Z124 Encounter for screening for malignant neoplasm of cervix: Secondary | ICD-10-CM | POA: Diagnosis not present

## 2013-04-17 DIAGNOSIS — IMO0001 Reserved for inherently not codable concepts without codable children: Secondary | ICD-10-CM | POA: Diagnosis not present

## 2013-04-17 DIAGNOSIS — E782 Mixed hyperlipidemia: Secondary | ICD-10-CM | POA: Diagnosis not present

## 2013-04-17 DIAGNOSIS — I10 Essential (primary) hypertension: Secondary | ICD-10-CM | POA: Diagnosis not present

## 2013-04-17 DIAGNOSIS — E559 Vitamin D deficiency, unspecified: Secondary | ICD-10-CM | POA: Diagnosis not present

## 2013-04-23 DIAGNOSIS — K219 Gastro-esophageal reflux disease without esophagitis: Secondary | ICD-10-CM | POA: Diagnosis not present

## 2013-04-23 DIAGNOSIS — E119 Type 2 diabetes mellitus without complications: Secondary | ICD-10-CM | POA: Diagnosis not present

## 2013-04-23 DIAGNOSIS — E559 Vitamin D deficiency, unspecified: Secondary | ICD-10-CM | POA: Diagnosis not present

## 2013-04-23 DIAGNOSIS — F329 Major depressive disorder, single episode, unspecified: Secondary | ICD-10-CM | POA: Diagnosis not present

## 2013-04-23 DIAGNOSIS — E782 Mixed hyperlipidemia: Secondary | ICD-10-CM | POA: Diagnosis not present

## 2013-04-23 DIAGNOSIS — I10 Essential (primary) hypertension: Secondary | ICD-10-CM | POA: Diagnosis not present

## 2013-06-07 DIAGNOSIS — J40 Bronchitis, not specified as acute or chronic: Secondary | ICD-10-CM | POA: Diagnosis not present

## 2013-06-07 DIAGNOSIS — R509 Fever, unspecified: Secondary | ICD-10-CM | POA: Diagnosis not present

## 2013-06-07 DIAGNOSIS — J019 Acute sinusitis, unspecified: Secondary | ICD-10-CM | POA: Diagnosis not present

## 2013-07-23 DIAGNOSIS — K219 Gastro-esophageal reflux disease without esophagitis: Secondary | ICD-10-CM | POA: Diagnosis not present

## 2013-07-23 DIAGNOSIS — I10 Essential (primary) hypertension: Secondary | ICD-10-CM | POA: Diagnosis not present

## 2013-07-23 DIAGNOSIS — E782 Mixed hyperlipidemia: Secondary | ICD-10-CM | POA: Diagnosis not present

## 2013-07-23 DIAGNOSIS — E119 Type 2 diabetes mellitus without complications: Secondary | ICD-10-CM | POA: Diagnosis not present

## 2013-07-30 DIAGNOSIS — I10 Essential (primary) hypertension: Secondary | ICD-10-CM | POA: Diagnosis not present

## 2013-07-30 DIAGNOSIS — Z23 Encounter for immunization: Secondary | ICD-10-CM | POA: Diagnosis not present

## 2013-07-30 DIAGNOSIS — K219 Gastro-esophageal reflux disease without esophagitis: Secondary | ICD-10-CM | POA: Diagnosis not present

## 2013-07-30 DIAGNOSIS — E559 Vitamin D deficiency, unspecified: Secondary | ICD-10-CM | POA: Diagnosis not present

## 2013-07-30 DIAGNOSIS — F329 Major depressive disorder, single episode, unspecified: Secondary | ICD-10-CM | POA: Diagnosis not present

## 2013-07-30 DIAGNOSIS — E782 Mixed hyperlipidemia: Secondary | ICD-10-CM | POA: Diagnosis not present

## 2013-08-08 DIAGNOSIS — Z23 Encounter for immunization: Secondary | ICD-10-CM | POA: Diagnosis not present

## 2013-09-03 DIAGNOSIS — R079 Chest pain, unspecified: Secondary | ICD-10-CM | POA: Diagnosis not present

## 2013-09-03 DIAGNOSIS — I10 Essential (primary) hypertension: Secondary | ICD-10-CM | POA: Diagnosis not present

## 2013-10-22 DIAGNOSIS — I10 Essential (primary) hypertension: Secondary | ICD-10-CM | POA: Diagnosis not present

## 2013-10-22 DIAGNOSIS — E559 Vitamin D deficiency, unspecified: Secondary | ICD-10-CM | POA: Diagnosis not present

## 2013-10-22 DIAGNOSIS — E782 Mixed hyperlipidemia: Secondary | ICD-10-CM | POA: Diagnosis not present

## 2013-10-22 DIAGNOSIS — K219 Gastro-esophageal reflux disease without esophagitis: Secondary | ICD-10-CM | POA: Diagnosis not present

## 2013-10-22 DIAGNOSIS — E119 Type 2 diabetes mellitus without complications: Secondary | ICD-10-CM | POA: Diagnosis not present

## 2013-10-29 DIAGNOSIS — F329 Major depressive disorder, single episode, unspecified: Secondary | ICD-10-CM | POA: Diagnosis not present

## 2013-10-29 DIAGNOSIS — E782 Mixed hyperlipidemia: Secondary | ICD-10-CM | POA: Diagnosis not present

## 2013-10-29 DIAGNOSIS — F3289 Other specified depressive episodes: Secondary | ICD-10-CM | POA: Diagnosis not present

## 2013-10-29 DIAGNOSIS — K219 Gastro-esophageal reflux disease without esophagitis: Secondary | ICD-10-CM | POA: Diagnosis not present

## 2013-10-29 DIAGNOSIS — IMO0001 Reserved for inherently not codable concepts without codable children: Secondary | ICD-10-CM | POA: Diagnosis not present

## 2013-10-29 DIAGNOSIS — I10 Essential (primary) hypertension: Secondary | ICD-10-CM | POA: Diagnosis not present

## 2013-10-29 DIAGNOSIS — E559 Vitamin D deficiency, unspecified: Secondary | ICD-10-CM | POA: Diagnosis not present

## 2014-02-05 DIAGNOSIS — E782 Mixed hyperlipidemia: Secondary | ICD-10-CM | POA: Diagnosis not present

## 2014-02-05 DIAGNOSIS — IMO0001 Reserved for inherently not codable concepts without codable children: Secondary | ICD-10-CM | POA: Diagnosis not present

## 2014-02-05 DIAGNOSIS — I10 Essential (primary) hypertension: Secondary | ICD-10-CM | POA: Diagnosis not present

## 2014-02-12 DIAGNOSIS — E782 Mixed hyperlipidemia: Secondary | ICD-10-CM | POA: Diagnosis not present

## 2014-02-12 DIAGNOSIS — IMO0001 Reserved for inherently not codable concepts without codable children: Secondary | ICD-10-CM | POA: Diagnosis not present

## 2014-02-12 DIAGNOSIS — E559 Vitamin D deficiency, unspecified: Secondary | ICD-10-CM | POA: Diagnosis not present

## 2014-02-12 DIAGNOSIS — K219 Gastro-esophageal reflux disease without esophagitis: Secondary | ICD-10-CM | POA: Diagnosis not present

## 2014-02-12 DIAGNOSIS — F3289 Other specified depressive episodes: Secondary | ICD-10-CM | POA: Diagnosis not present

## 2014-02-12 DIAGNOSIS — F329 Major depressive disorder, single episode, unspecified: Secondary | ICD-10-CM | POA: Diagnosis not present

## 2014-02-12 DIAGNOSIS — I10 Essential (primary) hypertension: Secondary | ICD-10-CM | POA: Diagnosis not present

## 2014-02-13 DIAGNOSIS — L820 Inflamed seborrheic keratosis: Secondary | ICD-10-CM | POA: Diagnosis not present

## 2014-02-13 DIAGNOSIS — D485 Neoplasm of uncertain behavior of skin: Secondary | ICD-10-CM | POA: Diagnosis not present

## 2014-02-13 DIAGNOSIS — L570 Actinic keratosis: Secondary | ICD-10-CM | POA: Diagnosis not present

## 2014-02-13 DIAGNOSIS — L259 Unspecified contact dermatitis, unspecified cause: Secondary | ICD-10-CM | POA: Diagnosis not present

## 2014-04-29 DIAGNOSIS — L280 Lichen simplex chronicus: Secondary | ICD-10-CM | POA: Diagnosis not present

## 2014-04-29 DIAGNOSIS — D485 Neoplasm of uncertain behavior of skin: Secondary | ICD-10-CM | POA: Diagnosis not present

## 2014-04-29 DIAGNOSIS — L570 Actinic keratosis: Secondary | ICD-10-CM | POA: Diagnosis not present

## 2014-05-13 DIAGNOSIS — L03115 Cellulitis of right lower limb: Secondary | ICD-10-CM | POA: Diagnosis not present

## 2014-05-20 DIAGNOSIS — E782 Mixed hyperlipidemia: Secondary | ICD-10-CM | POA: Diagnosis not present

## 2014-05-20 DIAGNOSIS — I10 Essential (primary) hypertension: Secondary | ICD-10-CM | POA: Diagnosis not present

## 2014-05-20 DIAGNOSIS — K219 Gastro-esophageal reflux disease without esophagitis: Secondary | ICD-10-CM | POA: Diagnosis not present

## 2014-05-20 DIAGNOSIS — E1165 Type 2 diabetes mellitus with hyperglycemia: Secondary | ICD-10-CM | POA: Diagnosis not present

## 2014-05-21 DIAGNOSIS — Z23 Encounter for immunization: Secondary | ICD-10-CM | POA: Diagnosis not present

## 2014-05-29 DIAGNOSIS — L259 Unspecified contact dermatitis, unspecified cause: Secondary | ICD-10-CM | POA: Diagnosis not present

## 2014-05-29 DIAGNOSIS — L570 Actinic keratosis: Secondary | ICD-10-CM | POA: Diagnosis not present

## 2014-07-03 DIAGNOSIS — E1165 Type 2 diabetes mellitus with hyperglycemia: Secondary | ICD-10-CM | POA: Diagnosis not present

## 2014-07-03 DIAGNOSIS — K219 Gastro-esophageal reflux disease without esophagitis: Secondary | ICD-10-CM | POA: Diagnosis not present

## 2014-07-03 DIAGNOSIS — E782 Mixed hyperlipidemia: Secondary | ICD-10-CM | POA: Diagnosis not present

## 2014-07-03 DIAGNOSIS — I10 Essential (primary) hypertension: Secondary | ICD-10-CM | POA: Diagnosis not present

## 2014-07-03 DIAGNOSIS — Z23 Encounter for immunization: Secondary | ICD-10-CM | POA: Diagnosis not present

## 2014-07-03 DIAGNOSIS — R110 Nausea: Secondary | ICD-10-CM | POA: Diagnosis not present

## 2014-07-03 DIAGNOSIS — Z0001 Encounter for general adult medical examination with abnormal findings: Secondary | ICD-10-CM | POA: Diagnosis not present

## 2014-07-03 DIAGNOSIS — F328 Other depressive episodes: Secondary | ICD-10-CM | POA: Diagnosis not present

## 2014-07-18 ENCOUNTER — Encounter (HOSPITAL_COMMUNITY): Payer: Self-pay | Admitting: Cardiovascular Disease

## 2014-07-29 ENCOUNTER — Emergency Department (HOSPITAL_COMMUNITY): Payer: Medicare Other

## 2014-07-29 ENCOUNTER — Encounter: Payer: Self-pay | Admitting: Physician Assistant

## 2014-07-29 ENCOUNTER — Emergency Department (HOSPITAL_COMMUNITY)
Admission: EM | Admit: 2014-07-29 | Discharge: 2014-07-29 | Disposition: A | Discharge status: Home / Self Care | Payer: Medicare Other | Attending: Emergency Medicine | Admitting: Emergency Medicine

## 2014-07-29 ENCOUNTER — Encounter (HOSPITAL_COMMUNITY): Payer: Self-pay | Admitting: Emergency Medicine

## 2014-07-29 DIAGNOSIS — Z951 Presence of aortocoronary bypass graft: Secondary | ICD-10-CM | POA: Insufficient documentation

## 2014-07-29 DIAGNOSIS — R079 Chest pain, unspecified: Secondary | ICD-10-CM | POA: Diagnosis not present

## 2014-07-29 DIAGNOSIS — I252 Old myocardial infarction: Secondary | ICD-10-CM | POA: Diagnosis not present

## 2014-07-29 DIAGNOSIS — I10 Essential (primary) hypertension: Secondary | ICD-10-CM | POA: Diagnosis not present

## 2014-07-29 DIAGNOSIS — Z79899 Other long term (current) drug therapy: Secondary | ICD-10-CM | POA: Diagnosis not present

## 2014-07-29 DIAGNOSIS — F329 Major depressive disorder, single episode, unspecified: Secondary | ICD-10-CM | POA: Insufficient documentation

## 2014-07-29 DIAGNOSIS — I2510 Atherosclerotic heart disease of native coronary artery without angina pectoris: Secondary | ICD-10-CM | POA: Diagnosis not present

## 2014-07-29 DIAGNOSIS — R0789 Other chest pain: Secondary | ICD-10-CM | POA: Diagnosis not present

## 2014-07-29 DIAGNOSIS — Z87448 Personal history of other diseases of urinary system: Secondary | ICD-10-CM | POA: Diagnosis not present

## 2014-07-29 DIAGNOSIS — E119 Type 2 diabetes mellitus without complications: Secondary | ICD-10-CM | POA: Diagnosis not present

## 2014-07-29 DIAGNOSIS — Z8719 Personal history of other diseases of the digestive system: Secondary | ICD-10-CM | POA: Diagnosis not present

## 2014-07-29 LAB — CBC
HCT: 39.7 % (ref 36.0–46.0)
Hemoglobin: 14 g/dL (ref 12.0–15.0)
MCH: 32.3 pg (ref 26.0–34.0)
MCHC: 35.3 g/dL (ref 30.0–36.0)
MCV: 91.7 fL (ref 78.0–100.0)
PLATELETS: 225 10*3/uL (ref 150–400)
RBC: 4.33 MIL/uL (ref 3.87–5.11)
RDW: 12.5 % (ref 11.5–15.5)
WBC: 5.8 10*3/uL (ref 4.0–10.5)

## 2014-07-29 LAB — BASIC METABOLIC PANEL
Anion gap: 13 (ref 5–15)
BUN: 13 mg/dL (ref 6–23)
CALCIUM: 10.3 mg/dL (ref 8.4–10.5)
CO2: 27 mEq/L (ref 19–32)
Chloride: 101 mEq/L (ref 96–112)
Creatinine, Ser: 0.79 mg/dL (ref 0.50–1.10)
GFR, EST NON AFRICAN AMERICAN: 83 mL/min — AB (ref >90)
Glucose, Bld: 224 mg/dL — ABNORMAL HIGH (ref 70–99)
POTASSIUM: 3.9 meq/L (ref 3.7–5.3)
SODIUM: 141 meq/L (ref 137–147)

## 2014-07-29 LAB — TROPONIN I

## 2014-07-29 MED ORDER — NITROGLYCERIN 0.4 MG SL SUBL
0.4000 mg | SUBLINGUAL_TABLET | SUBLINGUAL | Status: DC | PRN
  Filled 2014-07-29: qty 1

## 2014-07-29 MED ORDER — ASPIRIN 81 MG PO CHEW
324.0000 mg | CHEWABLE_TABLET | Freq: Once | ORAL | Status: AC
  Administered 2014-07-29: 324 mg via ORAL
  Filled 2014-07-29: qty 4

## 2014-07-29 NOTE — ED Notes (Signed)
Holding on Nitro. Pt denies any pain at present.

## 2014-07-29 NOTE — ED Notes (Signed)
Pt reports intermittent chest pain x2 weeks. Pt non-diaphoretic. nad noted. Pt reports pain is worse with movement and a deep breath.

## 2014-07-29 NOTE — Progress Notes (Signed)
Received message from Dr. Meda Coffee that patient needed f/u from ER. I have left a message on our office's scheduling voicemail requesting a follow-up appointment, and our office will call the patient with this information. Dugan Vanhoesen PA-C

## 2014-07-29 NOTE — ED Provider Notes (Signed)
CSN: [id number]     Arrival date & time 07/29/14  1742 History   First MD Initiated Contact with Patient 07/29/14 1813     This chart was scribed for No att. providers found by Forrestine Him, ED Scribe. This patient was seen in room APA11/APA11 and the patient's care was started 1:42 AM.   Chief Complaint  Patient presents with  . Chest Pain   The history is provided by the patient. No language interpreter was used.    HPI Comments: [name] is a 68 y.o. female with a PMHx of HTN, hyperlipidemia, DM, GERD who presents to the Emergency Department complaining of intermittent chest pain and L arm pain x 2 weeks. Pain is exacerbated with movement. Episodes last for approximately 30 minutes at a time. Pt had 1 dose of nitro at 1:25 this afternoon with mild relief for pain. No SOB, visual changes, abdominal pain, weakness, numbness, vomiting, nausea, or diarrhea. PSHx includes open heart surgery 2005 with a mild MI prior to surgery. She is followed by Dr. Kirk Ruths of [facility]. Last follow up 1 year ago. Last cardiac study performed performed approximately 1-3 years ago. No history of cancer.  Past Medical History  Diagnosis Date  . Hypertension   . Hyperlipidemia     a. Not on statin due to elevated liver enzymes.  . Coronary artery disease status post CABG 2005     a. s/p CABG 2005. b. 2011 - DES of SVG to PDA. c. Admitted with CP 03/2012 with stable anatomy by cath, felt noncardiac pain.  . Acute myocardial infarction, unspecified site, episode of care unspecified   . Esophageal reflux   . Diabetes mellitus   . GERD (gastroesophageal reflux disease)   . Depression   . History of cholecystectomy   . Hernia   . Elevated transaminase level     Most recently October 2012 most recent  . Kidney cysts     left  . Elevated LFTs    Past Surgical History  Procedure Laterality Date  . Bypass graft  08/2003    Quadruple   . Abdominal hysterectomy    . Cholecystectomy    .  Hernia repair  01/2011  . Left heart catheterization with coronary/graft angiogram N/A 04/05/2012    Procedure: LEFT HEART CATHETERIZATION WITH Beatrix Fetters;  Surgeon: Sherren Mocha, MD;  Location: [facility] CATH LAB;  Service: Cardiovascular;  Laterality: N/A;   Family History  Problem Relation Age of Onset  . Cancer Mother     stomach  . Diabetes Mother   . Heart attack Brother   . Addison's disease Sister    History  Substance Use Topics  . Smoking status: Never Smoker   . Smokeless tobacco: Never Used  . Alcohol Use: No   OB History    No data available     Review of Systems  Constitutional: Positive for diaphoresis.  Eyes: Negative for visual disturbance.  Respiratory: Negative for shortness of breath.   Cardiovascular: Positive for chest pain.  Gastrointestinal: Negative for nausea, vomiting and diarrhea.  Musculoskeletal: Positive for arthralgias.  Skin: Negative for rash.  Neurological: Positive for headaches. Negative for dizziness, weakness and numbness.  All other systems reviewed and are negative.     Allergies  Review of patient's allergies indicates no known allergies.  Home Medications   Prior to Admission medications   Medication Sig Start Date End Date Taking? Authorizing Provider  atenolol (TENORMIN) 50 MG tablet Take 75 mg by  mouth daily.    Yes Historical Provider, MD  cloNIDine (CATAPRES) 0.2 MG tablet Take 1 tablet (0.2 mg total) by mouth at bedtime. 10/04/12  Yes Lelon Perla, MD  furosemide (LASIX) 40 MG tablet Take 40 mg by mouth daily.     Yes Historical Provider, MD  gemfibrozil (LOPID) 600 MG tablet Take 600 mg by mouth 2 (two) times daily. 07/23/14  Yes Historical Provider, MD  glipiZIDE (GLUCOTROL) 10 MG tablet Take 10 mg by mouth 2 (two) times daily. 07/01/14  Yes Historical Provider, MD  HYDROcodone-acetaminophen (NORCO/VICODIN) 5-325 MG per tablet Take 1 tablet by mouth every 6 (six) hours as needed for moderate pain.   Yes  Historical Provider, MD  losartan (COZAAR) 100 MG tablet Take 100 mg by mouth daily. 06/24/14  Yes Historical Provider, MD  nitroGLYCERIN (NITROSTAT) 0.4 MG SL tablet Place 1 tablet (0.4 mg total) under the tongue every 5 (five) minutes as needed (up to 3 doses). For chest pain 04/05/12  Yes Dayna N Dunn, PA-C  PARoxetine (PAXIL) 40 MG tablet Take 40 mg by mouth every morning. 07/01/14  Yes Historical Provider, MD  zolpidem (AMBIEN) 10 MG tablet Take 10 mg by mouth at bedtime. 07/11/14  Yes Historical Provider, MD   Triage Vitals: BP 146/83 mmHg  Pulse 60  Temp(Src) 99.2 F (37.3 C) (Oral)  Resp 12  Ht 5\' 3"  (1.6 m)  Wt 160 lb (72.576 kg)  BMI 28.35 kg/m2  SpO2 99%   Physical Exam  Constitutional: She is oriented to person, place, and time. She appears well-developed and well-nourished. No distress.  HENT:  Head: Normocephalic and atraumatic.  Eyes: EOM are normal.  Neck: Normal range of motion.  Cardiovascular: Normal rate, regular rhythm and normal heart sounds.   Pulmonary/Chest: Effort normal and breath sounds normal. No respiratory distress.  Abdominal: Soft. She exhibits no distension. There is no tenderness.  Musculoskeletal: Normal range of motion.  No significant leg swelling  Neurological: She is alert and oriented to person, place, and time.  Skin: Skin is warm and dry.  Psychiatric: She has a normal mood and affect. Judgment normal.  Nursing note and vitals reviewed.   ED Course  Procedures (including critical care time)  DIAGNOSTIC STUDIES: Oxygen Saturation is 97% on RA, [place] by my interpretation.    COORDINATION OF CARE: 1:42 AM-Discussed treatment plan with pt at bedside and pt agreed to plan.     Labs Review Labs Reviewed  BASIC METABOLIC PANEL - Abnormal; Notable for the following:    Glucose, Bld 224 (*)    GFR calc non Af Amer 83 (*)    All other components within normal limits  CBC  TROPONIN I  TROPONIN I    Imaging Review Dg Chest 2  View  07/29/2014   CLINICAL DATA:  Intermittent chest pain for 2 weeks. Worsening pain with movement and deep inspiration. Initial encounter.  EXAM: CHEST  2 VIEW  COMPARISON:  Portable chest 10/15/2012. Two view examination 04/04/2012.  FINDINGS: The heart size and mediastinal contours are stable status post CABG. Coronary artery stent is noted. Mild chronic interstitial prominence in the lungs is stable. There is no evidence of superimposed edema, airspace disease or pleural effusion. The bones appear unchanged.  IMPRESSION: Stable postoperative chest.  No acute cardiopulmonary process.   Electronically Signed   By: Camie Patience M.D.   On: 07/29/2014 18:34     EKG Interpretation None     EKG reviewed similar previous, no acute  ST elevation or ST depression, sinus, heart rate 61, normal QT. MDM   Final diagnoses:  Chest pain, unspecified chest pain type   I personally performed the services described in this documentation, which was scribed in my presence. The recorded information has been reviewed and is accurate.  Patient with intermittent brief chest pain for the past 2 weeks, nonexertional. Patient does have cardiac history, cardiac evaluation done in ER. No classic blood clot risk factors. Patient well-appearing and pain-free and recheck. Discussed with cardiology on-call and patient are both comfortable with close outpatient follow-up, cardiology will arrange a stress test. Discussed reasons to return, patient on baby aspirin daily.  Results and differential diagnosis were discussed with the patient/parent/guardian. Close follow up outpatient was discussed, comfortable with the plan.   Medications  aspirin chewable tablet 324 mg (324 mg Oral Given 07/29/14 2036)    Filed Vitals:   07/29/14 1900 07/29/14 1930 07/29/14 2034 07/29/14 2130  BP: 158/73 137/89 145/83 146/83  Pulse: 59 61 62 60  Temp:      TempSrc:      Resp: 13 11 18 12   Height:      Weight:      SpO2: 96% 97% 98%  99%    Final diagnoses:  Chest pain, unspecified chest pain type    EKG reviewed   Mariea Clonts, MD 07/30/14 0145

## 2014-07-29 NOTE — Discharge Instructions (Signed)
Follow-up closely with her cardiologist for a stress test. If you develop persistent chest pain, pass out or worsening symptoms return to the ER.  If you were given medicines take as directed.  If you are on coumadin or contraceptives realize their levels and effectiveness is altered by many different medicines.  If you have any reaction (rash, tongues swelling, other) to the medicines stop taking and see a physician.   Please follow up as directed and return to the ER or see a physician for new or worsening symptoms.  Thank you. Filed Vitals:   07/29/14 1745 07/29/14 1823 07/29/14 1900 07/29/14 1930  BP: 181/75 159/88 158/73 137/89  Pulse: 68 61 59 61  Temp: 99.2 F (37.3 C)     TempSrc: Oral     Resp: 16 12 13 11   Height: 5\' 3"  (1.6 m)     Weight: 160 lb (72.576 kg)     SpO2: 97% 96% 96% 97%    Chest Pain (Nonspecific) It is often hard to give a specific diagnosis for the cause of chest pain. There is always a chance that your pain could be related to something serious, such as a heart attack or a blood clot in the lungs. You need to follow up with your health care provider for further evaluation. CAUSES   Heartburn.  Pneumonia or bronchitis.  Anxiety or stress.  Inflammation around your heart (pericarditis) or lung (pleuritis or pleurisy).  A blood clot in the lung.  A collapsed lung (pneumothorax). It can develop suddenly on its own (spontaneous pneumothorax) or from trauma to the chest.  Shingles infection (herpes zoster virus). The chest wall is composed of bones, muscles, and cartilage. Any of these can be the source of the pain.  The bones can be bruised by injury.  The muscles or cartilage can be strained by coughing or overwork.  The cartilage can be affected by inflammation and become sore (costochondritis). DIAGNOSIS  Lab tests or other studies may be needed to find the cause of your pain. Your health care provider may have you take a test called an ambulatory  electrocardiogram (ECG). An ECG records your heartbeat patterns over a 24-hour period. You may also have other tests, such as:  Transthoracic echocardiogram (TTE). During echocardiography, sound waves are used to evaluate how blood flows through your heart.  Transesophageal echocardiogram (TEE).  Cardiac monitoring. This allows your health care provider to monitor your heart rate and rhythm in real time.  Holter monitor. This is a portable device that records your heartbeat and can help diagnose heart arrhythmias. It allows your health care provider to track your heart activity for several days, if needed.  Stress tests by exercise or by giving medicine that makes the heart beat faster. TREATMENT   Treatment depends on what may be causing your chest pain. Treatment may include:  Acid blockers for heartburn.  Anti-inflammatory medicine.  Pain medicine for inflammatory conditions.  Antibiotics if an infection is present.  You may be advised to change lifestyle habits. This includes stopping smoking and avoiding alcohol, caffeine, and chocolate.  You may be advised to keep your head raised (elevated) when sleeping. This reduces the chance of acid going backward from your stomach into your esophagus. Most of the time, nonspecific chest pain will improve within 2-3 days with rest and mild pain medicine.  HOME CARE INSTRUCTIONS   If antibiotics were prescribed, take them as directed. Finish them even if you start to feel better.  For the  next few days, avoid physical activities that bring on chest pain. Continue physical activities as directed.  Do not use any tobacco products, including cigarettes, chewing tobacco, or electronic cigarettes.  Avoid drinking alcohol.  Only take medicine as directed by your health care provider.  Follow your health care provider's suggestions for further testing if your chest pain does not go away.  Keep any follow-up appointments you made. If you do  not go to an appointment, you could develop lasting (chronic) problems with pain. If there is any problem keeping an appointment, call to reschedule. SEEK MEDICAL CARE IF:   Your chest pain does not go away, even after treatment.  You have a rash with blisters on your chest.  You have a fever. SEEK IMMEDIATE MEDICAL CARE IF:   You have increased chest pain or pain that spreads to your arm, neck, jaw, back, or abdomen.  You have shortness of breath.  You have an increasing cough, or you cough up blood.  You have severe back or abdominal pain.  You feel nauseous or vomit.  You have severe weakness.  You faint.  You have chills. This is an emergency. Do not wait to see if the pain will go away. Get medical help at once. Call your local emergency services (911 in [place] Do not drive yourself to the hospital. MAKE SURE YOU:   Understand these instructions.  Will watch your condition.  Will get help right away if you are not doing well or get worse. Document Released: 05/05/2005 Document Revised: 07/31/2013 Document Reviewed: 02/29/2008 [facility] Patient Information 2015 [place], [place]. This information is not intended to replace advice given to you by your health care provider. Make sure you discuss any questions you have with your health care provider.

## 2014-08-05 DIAGNOSIS — M7552 Bursitis of left shoulder: Secondary | ICD-10-CM | POA: Diagnosis not present

## 2014-08-13 ENCOUNTER — Ambulatory Visit (INDEPENDENT_AMBULATORY_CARE_PROVIDER_SITE_OTHER): Payer: Medicare Other | Admitting: Physician Assistant

## 2014-08-13 ENCOUNTER — Encounter: Payer: Self-pay | Admitting: Physician Assistant

## 2014-08-13 VITALS — BP 150/82 | HR 64 | Ht 63.0 in | Wt 164.2 lb

## 2014-08-13 DIAGNOSIS — I10 Essential (primary) hypertension: Secondary | ICD-10-CM

## 2014-08-13 DIAGNOSIS — E785 Hyperlipidemia, unspecified: Secondary | ICD-10-CM | POA: Diagnosis not present

## 2014-08-13 DIAGNOSIS — R0789 Other chest pain: Secondary | ICD-10-CM | POA: Diagnosis not present

## 2014-08-13 MED ORDER — HYDRALAZINE HCL 25 MG PO TABS: 25.0000 mg | ORAL_TABLET | Freq: Three times a day (TID) | ORAL | Status: DC

## 2014-08-13 NOTE — Assessment & Plan Note (Signed)
On gemfibrozil

## 2014-08-13 NOTE — Assessment & Plan Note (Addendum)
She's noticed improvement in her chest and left arm pain after starting Naprosyn.  Last cath 03/2012 with patnet grafts.

## 2014-08-13 NOTE — Assessment & Plan Note (Addendum)
Patient's blood pressure is elevated and according to her this morning was over 514 systolic and has been as high as 200. She says that her liver enzymes have been elevated for quite some time however I don't see any results charted. Call and get her last set of labs from her primary care provider. Started her on hydralazine 25 mg tablet 3 times daily. She is already on higher doses losartan and atenolol.  Amlodipine may be a better choice.  We see how she does.

## 2014-08-13 NOTE — Patient Instructions (Signed)
START Hydralazine 25mg  Three times daily.  Your physician recommends that you schedule a follow-up appointment in: 1 month with Dr.Crenshaw

## 2014-08-13 NOTE — Progress Notes (Addendum)
Patient ID: [name], female   DOB: Oct 04, 1945, 69 y.o.   MRN: [id number]    Date:  08/13/2014   ID:  Whitney, [name] 1946/07/01, MRN [id number]  PCP:  Curlene Labrum, MD  Primary Cardiologist:  Stanford Breed   Chief Complaint  Patient presents with  . Follow-up    post hospital 12/21 has seen PCP and Dx of bursitis which has caused the left arm pain.     History of Present Illness: [name] is a 69 y.o. female who has a history of coronary artery disease status post coronary bypassing graft in 2005. She has had prior PCI of the saphenous vein graft to the right coronary artery. He has had problems with recurrent chest pain. Cardiac catheterization in August of 2013 revealed occlusion of all 3 native arteries. The saphenous vein graft to the first and second marginal were patent. The saphenous vein graft to the PDA was patent. The LIMA to the LAD was patent. Ejection fraction 55-65%. Patient admitted again in March of 2014 with chest pain and enzymes were negative. She was also recently admitted in December with chest and left arm pain, which was exacerbated by movement.  Troponin was again negative.  She was started on Naprosyn and she says this has helped. She says the pain in her chest she's been having ever since her surgery and is not new.   According to the patient this morning her BP was over 008 systolic and has been as high as 200. She says that her liver enzymes have been elevated for quite some time however, I don't see any results charted.    The patient currently denies nausea, vomiting, fever, shortness of breath, orthopnea, dizziness, PND, cough, congestion, abdominal pain, hematochezia, melena, lower extremity edema, claudication.  Wt Readings from Last 3 Encounters:  08/13/14 164 lb 3.2 oz (74.481 kg)  07/29/14 160 lb (72.576 kg)  11/07/12 168 lb 12.8 oz (76.567 kg)     Past Medical History  Diagnosis Date  . Hypertension   . Hyperlipidemia     a. Not on statin  due to elevated liver enzymes.  . Coronary artery disease status post CABG 2005     a. s/p CABG 2005. b. 2011 - DES of SVG to PDA. c. Admitted with CP 03/2012 with stable anatomy by cath, felt noncardiac pain.  . Acute myocardial infarction, unspecified site, episode of care unspecified   . Esophageal reflux   . Diabetes mellitus   . GERD (gastroesophageal reflux disease)   . Depression   . History of cholecystectomy   . Hernia   . Elevated transaminase level     Most recently October 2012 most recent  . Kidney cysts     left  . Elevated LFTs     Current Outpatient Prescriptions  Medication Sig Dispense Refill  . atenolol (TENORMIN) 50 MG tablet Take 75 mg by mouth daily.     . cloNIDine (CATAPRES) 0.2 MG tablet Take 1 tablet (0.2 mg total) by mouth at bedtime. 30 tablet 12  . furosemide (LASIX) 40 MG tablet Take 40 mg by mouth daily.      Marland Kitchen gemfibrozil (LOPID) 600 MG tablet Take 600 mg by mouth 2 (two) times daily.    Marland Kitchen glipiZIDE (GLUCOTROL) 10 MG tablet Take 10 mg by mouth 2 (two) times daily.    Marland Kitchen HYDROcodone-acetaminophen (NORCO/VICODIN) 5-325 MG per tablet Take 1 tablet by mouth every 6 (six) hours as needed for moderate pain.    Marland Kitchen  losartan (COZAAR) 100 MG tablet Take 100 mg by mouth daily.    . naproxen (NAPROSYN) 500 MG tablet Take 500 mg by mouth 2 (two) times daily.    . nitroGLYCERIN (NITROSTAT) 0.4 MG SL tablet Place 1 tablet (0.4 mg total) under the tongue every 5 (five) minutes as needed (up to 3 doses). For chest pain    . PARoxetine (PAXIL) 40 MG tablet Take 40 mg by mouth every morning.    . zolpidem (AMBIEN) 10 MG tablet Take 10 mg by mouth at bedtime.    . hydrALAZINE (APRESOLINE) 25 MG tablet Take 1 tablet (25 mg total) by mouth 3 (three) times daily. 270 tablet 3   No current facility-administered medications for this visit.    Allergies:   No Known Allergies  Social History:  The patient  reports that she has never smoked. She has never used smokeless tobacco.  She reports that she does not drink alcohol or use illicit drugs.   Family history:   Family History  Problem Relation Age of Onset  . Cancer Mother     stomach  . Diabetes Mother   . Heart attack Brother   . Addison's disease Sister     ROS:  Please see the history of present illness.  All other systems reviewed and negative.   PHYSICAL EXAM: VS:  BP 150/82 mmHg  Pulse 64  Ht 5\' 3"  (1.6 m)  Wt 164 lb 3.2 oz (74.481 kg)  BMI 29.09 kg/m2 Well nourished, well developed, in no acute distress HEENT: Pupils are equal round react to light accommodation extraocular movements are intact.  Neck: no JVDNo cervical lymphadenopathy. Cardiac: Regular rate and rhythm without murmurs rubs or gallops. Lungs:  clear to auscultation bilaterally, no wheezing, rhonchi or rales Ext: no lower extremity edema.  2+ radial and dorsalis pedis pulses. Skin: warm and dry Neuro:  Grossly normal  Labs: 05/20/2014 CBC within normal limits CMP: Glucose 223, total bilirubin 1.4, AST 50, ALT 77 Total cholesterol 219, triglycerides 516, HDL 32 A1c 7.7 TSH 1.9 Urine microalbumin 58.3  ASSESSMENT AND PLAN:  Problem List Items Addressed This Visit    Hyperlipidemia    On gemfibrozil      Relevant Medications      hydrALAZINE (APRESOLINE) tablet   Essential hypertension - Primary    Patient's blood pressure is elevated and according to her this morning was over 545 systolic and has been as high as 200. She says that her liver enzymes have been elevated for quite some time however I don't see any results charted. Call and get her last set of labs from her primary care provider. Started her on hydralazine 25 mg tablet 3 times daily. She is already on higher doses losartan and atenolol.  Amlodipine may be a better choice.  We see how she does.     Relevant Medications      hydrALAZINE (APRESOLINE) tablet   Chest pain    She's noticed improvement in her chest and left arm pain after starting Naprosyn.  Last  cath 03/2012 with patnet grafts.

## 2014-08-14 ENCOUNTER — Encounter: Payer: Self-pay | Admitting: Cardiology

## 2014-08-19 ENCOUNTER — Telehealth: Payer: Self-pay | Admitting: Cardiology

## 2014-08-19 MED ORDER — AMLODIPINE BESYLATE 5 MG PO TABS: 5.0000 mg | ORAL_TABLET | Freq: Every day | ORAL | Status: DC

## 2014-08-19 NOTE — Telephone Encounter (Signed)
Pt called in stating that she came in to see Gaspar Bidding last week for her BP and he prescribed Hydralazine for her. She says since then her BP has still been running high..192/85. She also states that she is feeling dizzy. Please call  Thanks

## 2014-08-19 NOTE — Telephone Encounter (Signed)
Spoke with pt, Aware of dr crenshaw's recommendations. Patient voiced understanding of medication change. °

## 2014-08-19 NOTE — Telephone Encounter (Signed)
Make sure pt taking cozaar, dc hydralazine, norvasc 5 mg daily, follow bp Kirk Ruths

## 2014-08-19 NOTE — Telephone Encounter (Signed)
Pt reports BPs still high since being seen by Gaspar Bidding last week. She has been taking the newly prescribed hydralazine.  Last 2 days - BP reads (sampling): 192/85  197/92  167/72  She is currently taking: 0800  Hydralazine Atenolol Clonidine  1200 Hydralazine  1800 Clonidine  Hydralazine   I discussed adjusting dose times slightly for Hydralazine so that she has an AM dose, an afternoon dose, and a bedtime dose. Pt voiced understanding. Pt is taking BP readings 2-3 times daily. According to pt, there does not seem to be a dip or peak, her readings are consistently high regardless of check times.   Per last note Gaspar Bidding discussed starting Amlodipine. Pt is hesitant to start any additional medications.

## 2014-08-26 DIAGNOSIS — L309 Dermatitis, unspecified: Secondary | ICD-10-CM | POA: Diagnosis not present

## 2014-08-26 DIAGNOSIS — L281 Prurigo nodularis: Secondary | ICD-10-CM | POA: Diagnosis not present

## 2014-08-26 DIAGNOSIS — L821 Other seborrheic keratosis: Secondary | ICD-10-CM | POA: Diagnosis not present

## 2014-10-25 NOTE — Progress Notes (Signed)
HPI: [name] is a pleasant female who has a history of coronary artery disease status post coronary bypassing graft in 2005. She has had prior PCI of the saphenous vein graft to the right coronary artery. She has had problems with recurrent chest pain. Cardiac catheterization in August of 2013 revealed occlusion of all 3 native coronaries. The saphenous vein graft to the first and second marginal was patent. The saphenous vein graft to the PDA was patent. The LIMA to the LAD was patent. Ejection fraction 55-65%. Since last seen,   Current Outpatient Prescriptions  Medication Sig Dispense Refill  . amLODipine (NORVASC) 5 MG tablet Take 1 tablet (5 mg total) by mouth daily. 30 tablet 12  . atenolol (TENORMIN) 50 MG tablet Take 75 mg by mouth daily.     . cloNIDine (CATAPRES) 0.2 MG tablet Take 1 tablet (0.2 mg total) by mouth at bedtime. 30 tablet 12  . furosemide (LASIX) 40 MG tablet Take 40 mg by mouth daily.      Marland Kitchen gemfibrozil (LOPID) 600 MG tablet Take 600 mg by mouth 2 (two) times daily.    Marland Kitchen glipiZIDE (GLUCOTROL) 10 MG tablet Take 10 mg by mouth 2 (two) times daily.    Marland Kitchen HYDROcodone-acetaminophen (NORCO/VICODIN) 5-325 MG per tablet Take 1 tablet by mouth every 6 (six) hours as needed for moderate pain.    Marland Kitchen losartan (COZAAR) 100 MG tablet Take 100 mg by mouth daily.    . naproxen (NAPROSYN) 500 MG tablet Take 500 mg by mouth 2 (two) times daily.    . nitroGLYCERIN (NITROSTAT) 0.4 MG SL tablet Place 1 tablet (0.4 mg total) under the tongue every 5 (five) minutes as needed (up to 3 doses). For chest pain    . PARoxetine (PAXIL) 40 MG tablet Take 40 mg by mouth every morning.    . zolpidem (AMBIEN) 10 MG tablet Take 10 mg by mouth at bedtime.     No current facility-administered medications for this visit.     Past Medical History  Diagnosis Date  . Hypertension   . Hyperlipidemia     a. Not on statin due to elevated liver enzymes.  . Coronary artery disease status post CABG  2005     a. s/p CABG 2005. b. 2011 - DES of SVG to PDA. c. Admitted with CP 03/2012 with stable anatomy by cath, felt noncardiac pain.  . Acute myocardial infarction, unspecified site, episode of care unspecified   . Esophageal reflux   . Diabetes mellitus   . GERD (gastroesophageal reflux disease)   . Depression   . History of cholecystectomy   . Hernia   . Elevated transaminase level     Most recently October 2012 most recent  . Kidney cysts     left  . Elevated LFTs     Past Surgical History  Procedure Laterality Date  . Bypass graft  08/2003    Quadruple   . Abdominal hysterectomy    . Cholecystectomy    . Hernia repair  01/2011  . Left heart catheterization with coronary/graft angiogram N/A 04/05/2012    Procedure: LEFT HEART CATHETERIZATION WITH Beatrix Fetters;  Surgeon: Sherren Mocha, MD;  Location: [facility] CATH LAB;  Service: Cardiovascular;  Laterality: N/A;    History   Social History  . Marital Status: Married    Spouse Name: N/A  . Number of Children: 2  . Years of Education: N/A   Occupational History  . Part-Time hair-dresser  Social History Main Topics  . Smoking status: Never Smoker   . Smokeless tobacco: Never Used  . Alcohol Use: No  . Drug Use: No  . Sexual Activity: Not on file   Other Topics Concern  . Not on file   Social History Narrative    ROS: no fevers or chills, productive cough, hemoptysis, dysphasia, odynophagia, melena, hematochezia, dysuria, hematuria, rash, seizure activity, orthopnea, PND, pedal edema, claudication. Remaining systems are negative.  Physical Exam: Well-developed well-nourished in no acute distress.  Skin is warm and dry.  HEENT is normal.  Neck is supple.  Chest is clear to auscultation with normal expansion.  Cardiovascular exam is regular rate and rhythm.  Abdominal exam nontender or distended. No masses palpated. Extremities show no edema. neuro grossly intact  ECG  This encounter was created  in error - please disregard.

## 2014-10-28 ENCOUNTER — Encounter: Payer: Medicare Other | Admitting: Cardiology

## 2014-11-28 DIAGNOSIS — I10 Essential (primary) hypertension: Secondary | ICD-10-CM | POA: Diagnosis not present

## 2014-11-28 DIAGNOSIS — R300 Dysuria: Secondary | ICD-10-CM | POA: Diagnosis not present

## 2014-11-28 DIAGNOSIS — E1165 Type 2 diabetes mellitus with hyperglycemia: Secondary | ICD-10-CM | POA: Diagnosis not present

## 2014-11-28 DIAGNOSIS — F328 Other depressive episodes: Secondary | ICD-10-CM | POA: Diagnosis not present

## 2014-11-28 DIAGNOSIS — K219 Gastro-esophageal reflux disease without esophagitis: Secondary | ICD-10-CM | POA: Diagnosis not present

## 2014-11-28 DIAGNOSIS — E782 Mixed hyperlipidemia: Secondary | ICD-10-CM | POA: Diagnosis not present

## 2014-12-24 DIAGNOSIS — I10 Essential (primary) hypertension: Secondary | ICD-10-CM | POA: Diagnosis not present

## 2014-12-24 DIAGNOSIS — E1165 Type 2 diabetes mellitus with hyperglycemia: Secondary | ICD-10-CM | POA: Diagnosis not present

## 2014-12-24 DIAGNOSIS — E782 Mixed hyperlipidemia: Secondary | ICD-10-CM | POA: Diagnosis not present

## 2014-12-24 DIAGNOSIS — K760 Fatty (change of) liver, not elsewhere classified: Secondary | ICD-10-CM | POA: Diagnosis not present

## 2014-12-24 DIAGNOSIS — K219 Gastro-esophageal reflux disease without esophagitis: Secondary | ICD-10-CM | POA: Diagnosis not present

## 2014-12-31 DIAGNOSIS — E1165 Type 2 diabetes mellitus with hyperglycemia: Secondary | ICD-10-CM | POA: Diagnosis not present

## 2014-12-31 DIAGNOSIS — K760 Fatty (change of) liver, not elsewhere classified: Secondary | ICD-10-CM | POA: Diagnosis not present

## 2014-12-31 DIAGNOSIS — F328 Other depressive episodes: Secondary | ICD-10-CM | POA: Diagnosis not present

## 2014-12-31 DIAGNOSIS — M545 Low back pain: Secondary | ICD-10-CM | POA: Diagnosis not present

## 2014-12-31 DIAGNOSIS — K219 Gastro-esophageal reflux disease without esophagitis: Secondary | ICD-10-CM | POA: Diagnosis not present

## 2014-12-31 DIAGNOSIS — E782 Mixed hyperlipidemia: Secondary | ICD-10-CM | POA: Diagnosis not present

## 2014-12-31 DIAGNOSIS — R110 Nausea: Secondary | ICD-10-CM | POA: Diagnosis not present

## 2014-12-31 DIAGNOSIS — I10 Essential (primary) hypertension: Secondary | ICD-10-CM | POA: Diagnosis not present

## 2015-03-07 DIAGNOSIS — E1165 Type 2 diabetes mellitus with hyperglycemia: Secondary | ICD-10-CM | POA: Diagnosis not present

## 2015-03-07 DIAGNOSIS — K219 Gastro-esophageal reflux disease without esophagitis: Secondary | ICD-10-CM | POA: Diagnosis not present

## 2015-03-07 DIAGNOSIS — K760 Fatty (change of) liver, not elsewhere classified: Secondary | ICD-10-CM | POA: Diagnosis not present

## 2015-03-07 DIAGNOSIS — E782 Mixed hyperlipidemia: Secondary | ICD-10-CM | POA: Diagnosis not present

## 2015-03-07 DIAGNOSIS — I10 Essential (primary) hypertension: Secondary | ICD-10-CM | POA: Diagnosis not present

## 2015-03-25 DIAGNOSIS — K219 Gastro-esophageal reflux disease without esophagitis: Secondary | ICD-10-CM | POA: Diagnosis not present

## 2015-03-25 DIAGNOSIS — E1165 Type 2 diabetes mellitus with hyperglycemia: Secondary | ICD-10-CM | POA: Diagnosis not present

## 2015-03-25 DIAGNOSIS — F328 Other depressive episodes: Secondary | ICD-10-CM | POA: Diagnosis not present

## 2015-03-25 DIAGNOSIS — I10 Essential (primary) hypertension: Secondary | ICD-10-CM | POA: Diagnosis not present

## 2015-03-25 DIAGNOSIS — K760 Fatty (change of) liver, not elsewhere classified: Secondary | ICD-10-CM | POA: Diagnosis not present

## 2015-03-25 DIAGNOSIS — E782 Mixed hyperlipidemia: Secondary | ICD-10-CM | POA: Diagnosis not present

## 2015-05-12 DIAGNOSIS — L821 Other seborrheic keratosis: Secondary | ICD-10-CM | POA: Diagnosis not present

## 2015-05-12 DIAGNOSIS — D485 Neoplasm of uncertain behavior of skin: Secondary | ICD-10-CM | POA: Diagnosis not present

## 2015-05-12 DIAGNOSIS — B078 Other viral warts: Secondary | ICD-10-CM | POA: Diagnosis not present

## 2015-05-12 DIAGNOSIS — L570 Actinic keratosis: Secondary | ICD-10-CM | POA: Diagnosis not present

## 2015-05-12 DIAGNOSIS — F411 Generalized anxiety disorder: Secondary | ICD-10-CM | POA: Diagnosis not present

## 2015-06-12 DIAGNOSIS — L570 Actinic keratosis: Secondary | ICD-10-CM | POA: Diagnosis not present

## 2015-06-12 DIAGNOSIS — L259 Unspecified contact dermatitis, unspecified cause: Secondary | ICD-10-CM | POA: Diagnosis not present

## 2015-07-07 DIAGNOSIS — Z23 Encounter for immunization: Secondary | ICD-10-CM | POA: Diagnosis not present

## 2015-07-08 ENCOUNTER — Encounter: Payer: Self-pay | Admitting: Gastroenterology

## 2015-09-09 DIAGNOSIS — F411 Generalized anxiety disorder: Secondary | ICD-10-CM | POA: Diagnosis not present

## 2015-09-09 DIAGNOSIS — I10 Essential (primary) hypertension: Secondary | ICD-10-CM | POA: Diagnosis not present

## 2015-09-09 DIAGNOSIS — K219 Gastro-esophageal reflux disease without esophagitis: Secondary | ICD-10-CM | POA: Diagnosis not present

## 2015-09-09 DIAGNOSIS — K760 Fatty (change of) liver, not elsewhere classified: Secondary | ICD-10-CM | POA: Diagnosis not present

## 2015-09-09 DIAGNOSIS — E1165 Type 2 diabetes mellitus with hyperglycemia: Secondary | ICD-10-CM | POA: Diagnosis not present

## 2015-09-09 DIAGNOSIS — E782 Mixed hyperlipidemia: Secondary | ICD-10-CM | POA: Diagnosis not present

## 2015-12-05 ENCOUNTER — Emergency Department (HOSPITAL_COMMUNITY): Payer: Medicare Other

## 2015-12-05 ENCOUNTER — Encounter (HOSPITAL_COMMUNITY): Payer: Self-pay | Admitting: *Deleted

## 2015-12-05 ENCOUNTER — Emergency Department (HOSPITAL_COMMUNITY)
Admission: EM | Admit: 2015-12-05 | Discharge: 2015-12-05 | Disposition: A | Discharge status: Home / Self Care | Payer: Medicare Other | Attending: Emergency Medicine | Admitting: Emergency Medicine

## 2015-12-05 DIAGNOSIS — I252 Old myocardial infarction: Secondary | ICD-10-CM | POA: Insufficient documentation

## 2015-12-05 DIAGNOSIS — R1013 Epigastric pain: Secondary | ICD-10-CM | POA: Diagnosis not present

## 2015-12-05 DIAGNOSIS — E785 Hyperlipidemia, unspecified: Secondary | ICD-10-CM | POA: Insufficient documentation

## 2015-12-05 DIAGNOSIS — R109 Unspecified abdominal pain: Secondary | ICD-10-CM | POA: Diagnosis not present

## 2015-12-05 DIAGNOSIS — R110 Nausea: Secondary | ICD-10-CM | POA: Insufficient documentation

## 2015-12-05 DIAGNOSIS — F329 Major depressive disorder, single episode, unspecified: Secondary | ICD-10-CM | POA: Diagnosis not present

## 2015-12-05 DIAGNOSIS — I10 Essential (primary) hypertension: Secondary | ICD-10-CM | POA: Insufficient documentation

## 2015-12-05 DIAGNOSIS — R1010 Upper abdominal pain, unspecified: Secondary | ICD-10-CM | POA: Diagnosis not present

## 2015-12-05 DIAGNOSIS — Q6100 Congenital renal cyst, unspecified: Secondary | ICD-10-CM | POA: Diagnosis not present

## 2015-12-05 DIAGNOSIS — Z90710 Acquired absence of both cervix and uterus: Secondary | ICD-10-CM | POA: Diagnosis not present

## 2015-12-05 DIAGNOSIS — M549 Dorsalgia, unspecified: Secondary | ICD-10-CM | POA: Diagnosis not present

## 2015-12-05 DIAGNOSIS — Z79899 Other long term (current) drug therapy: Secondary | ICD-10-CM | POA: Diagnosis not present

## 2015-12-05 DIAGNOSIS — I2510 Atherosclerotic heart disease of native coronary artery without angina pectoris: Secondary | ICD-10-CM | POA: Insufficient documentation

## 2015-12-05 DIAGNOSIS — Z9049 Acquired absence of other specified parts of digestive tract: Secondary | ICD-10-CM | POA: Diagnosis not present

## 2015-12-05 DIAGNOSIS — Z791 Long term (current) use of non-steroidal anti-inflammatories (NSAID): Secondary | ICD-10-CM | POA: Diagnosis not present

## 2015-12-05 DIAGNOSIS — R1011 Right upper quadrant pain: Secondary | ICD-10-CM | POA: Diagnosis present

## 2015-12-05 DIAGNOSIS — R079 Chest pain, unspecified: Secondary | ICD-10-CM | POA: Diagnosis not present

## 2015-12-05 DIAGNOSIS — Z8719 Personal history of other diseases of the digestive system: Secondary | ICD-10-CM | POA: Insufficient documentation

## 2015-12-05 DIAGNOSIS — R0789 Other chest pain: Secondary | ICD-10-CM | POA: Diagnosis not present

## 2015-12-05 LAB — CBC
HEMATOCRIT: 42.8 % (ref 36.0–46.0)
Hemoglobin: 15.3 g/dL — ABNORMAL HIGH (ref 12.0–15.0)
MCH: 31.9 pg (ref 26.0–34.0)
MCHC: 35.7 g/dL (ref 30.0–36.0)
MCV: 89.4 fL (ref 78.0–100.0)
PLATELETS: 230 10*3/uL (ref 150–400)
RBC: 4.79 MIL/uL (ref 3.87–5.11)
RDW: 12.6 % (ref 11.5–15.5)
WBC: 5.3 10*3/uL (ref 4.0–10.5)

## 2015-12-05 LAB — URINALYSIS, ROUTINE W REFLEX MICROSCOPIC
BILIRUBIN URINE: NEGATIVE
Glucose, UA: >1000 mg/dL — AB
Hgb urine dipstick: NEGATIVE
Ketones, ur: NEGATIVE mg/dL
Leukocytes, UA: NEGATIVE
NITRITE: NEGATIVE
PH: 5 (ref 5.0–8.0)
PROTEIN: NEGATIVE mg/dL
Specific Gravity, Urine: 1.017 (ref 1.005–1.030)

## 2015-12-05 LAB — URINE MICROSCOPIC-ADD ON
Bacteria, UA: NONE SEEN
RBC / HPF: NONE SEEN RBC/hpf (ref 0–5)

## 2015-12-05 LAB — COMPREHENSIVE METABOLIC PANEL
ALT: 56 U/L — ABNORMAL HIGH (ref 14–54)
ANION GAP: 14 (ref 5–15)
AST: 44 U/L — AB (ref 15–41)
Albumin: 4.7 g/dL (ref 3.5–5.0)
Alkaline Phosphatase: 117 U/L (ref 38–126)
BUN: 10 mg/dL (ref 6–20)
CO2: 21 mmol/L — ABNORMAL LOW (ref 22–32)
Calcium: 10.4 mg/dL — ABNORMAL HIGH (ref 8.9–10.3)
Chloride: 101 mmol/L (ref 101–111)
Creatinine, Ser: 0.8 mg/dL (ref 0.44–1.00)
GFR calc Af Amer: >60 mL/min (ref >60)
Glucose, Bld: 405 mg/dL — ABNORMAL HIGH (ref 65–99)
Potassium: 3.9 mmol/L (ref 3.5–5.1)
Sodium: 136 mmol/L (ref 135–145)
Total Bilirubin: 1.5 mg/dL — ABNORMAL HIGH (ref 0.3–1.2)
Total Protein: 8.1 g/dL (ref 6.5–8.1)

## 2015-12-05 LAB — I-STAT TROPONIN, ED: TROPONIN I, POC: 0.01 ng/mL (ref 0.00–0.08)

## 2015-12-05 LAB — LIPASE, BLOOD: Lipase: 117 U/L — ABNORMAL HIGH (ref 11–51)

## 2015-12-05 MED ORDER — FAMOTIDINE 20 MG PO TABS: 20.0000 mg | ORAL_TABLET | Freq: Two times a day (BID) | ORAL | Status: DC

## 2015-12-05 MED ORDER — GI COCKTAIL ~~LOC~~
30.0000 mL | Freq: Once | ORAL | Status: AC
  Administered 2015-12-05: 30 mL via ORAL
  Filled 2015-12-05: qty 30

## 2015-12-05 MED ORDER — IOPAMIDOL (ISOVUE-300) INJECTION 61%
INTRAVENOUS | Status: AC
  Administered 2015-12-05: 80 mL via INTRAVENOUS
  Filled 2015-12-05: qty 100

## 2015-12-05 MED ORDER — OMEPRAZOLE 40 MG PO CPDR: 40.0000 mg | DELAYED_RELEASE_CAPSULE | Freq: Every day | ORAL | Status: DC

## 2015-12-05 MED ORDER — SODIUM CHLORIDE 0.9 % IV BOLUS (SEPSIS)
1000.0000 mL | Freq: Once | INTRAVENOUS | Status: AC
  Administered 2015-12-05: 1000 mL via INTRAVENOUS

## 2015-12-05 NOTE — ED Notes (Signed)
Patient transported to CT 

## 2015-12-05 NOTE — ED Notes (Signed)
Pt ambulated to restroom with problems.

## 2015-12-05 NOTE — ED Notes (Signed)
MD at bedside. 

## 2015-12-05 NOTE — ED Notes (Signed)
Pt reports mid abd pain that radiates around right side and up into her chest and back. Pain has been going on for a year and more severe x past 6 months. Has appt next week. Has mild nausea, no vomiting or diarrhea.

## 2015-12-05 NOTE — ED Notes (Signed)
Pt in radiology. Radiology called to have pt transported back to [facility] when finished.

## 2015-12-05 NOTE — ED Provider Notes (Signed)
CSN: [id number]     Arrival date & time 12/05/15  0841 History   First MD Initiated Contact with Patient 12/05/15 [phone]     Chief Complaint  Patient presents with  . Abdominal Pain  . Chest Pain     (Consider location/radiation/quality/duration/timing/severity/associated sxs/prior Treatment) HPI  70 year old female presents with worsening upper abdominal pain. Started about one year ago when it first was intermittent. Now is constant over the last 6 months. Pain happens all the time but seems to be worse about 30 minutes-one hour after eating. Pain is in her upper abdomen especially right upper abdomen, radiates around to her back and up into her chest. She states she is on multiple medicines for reflux including Prilosec and Pepcid. No shortness of breath. Pain feels like a dull ache. Patient states there is no urinary symptoms. No vomiting. She does get nauseated whenever the pain comes. Denies blood in her stool or black stool. Patient states she has lost about 12 pounds over the last 6+ months, she thinks this is from stress of finding out her daughter has cancer.  Past Medical History  Diagnosis Date  . Hypertension   . Hyperlipidemia     a. Not on statin due to elevated liver enzymes.  . Coronary artery disease status post CABG 2005     a. s/p CABG 2005. b. 2011 - DES of SVG to PDA. c. Admitted with CP 03/2012 with stable anatomy by cath, felt noncardiac pain.  . Acute myocardial infarction, unspecified site, episode of care unspecified   . Esophageal reflux   . Diabetes mellitus   . GERD (gastroesophageal reflux disease)   . Depression   . History of cholecystectomy   . Hernia   . Elevated transaminase level     Most recently October 2012 most recent  . Kidney cysts     left  . Elevated LFTs    Past Surgical History  Procedure Laterality Date  . Bypass graft  08/2003    Quadruple   . Abdominal hysterectomy    . Cholecystectomy    . Hernia repair  01/2011  . Left heart  catheterization with coronary/graft angiogram N/A 04/05/2012    Procedure: LEFT HEART CATHETERIZATION WITH Beatrix Fetters;  Surgeon: Sherren Mocha, MD;  Location: [facility] CATH LAB;  Service: Cardiovascular;  Laterality: N/A;   Family History  Problem Relation Age of Onset  . Cancer Mother     stomach  . Diabetes Mother   . Heart attack Brother   . Addison's disease Sister    Social History  Substance Use Topics  . Smoking status: Never Smoker   . Smokeless tobacco: Never Used  . Alcohol Use: No   OB History    No data available     Review of Systems  Constitutional: Negative for fever.  Respiratory: Negative for shortness of breath.   Cardiovascular: Positive for chest pain.  Gastrointestinal: Positive for nausea and abdominal pain. Negative for vomiting and blood in stool.  Genitourinary: Negative for dysuria.  Musculoskeletal: Positive for back pain.  All other systems reviewed and are negative.     Allergies  Review of patient's allergies indicates no known allergies.  Home Medications   Prior to Admission medications   Medication Sig Start Date End Date Taking? Authorizing Provider  amLODipine (NORVASC) 5 MG tablet Take 1 tablet (5 mg total) by mouth daily. 08/19/14   Lelon Perla, MD  atenolol (TENORMIN) 50 MG tablet Take 75 mg by mouth daily.  Historical Provider, MD  cloNIDine (CATAPRES) 0.2 MG tablet Take 1 tablet (0.2 mg total) by mouth at bedtime. 10/04/12   Lelon Perla, MD  furosemide (LASIX) 40 MG tablet Take 40 mg by mouth daily.      Historical Provider, MD  gemfibrozil (LOPID) 600 MG tablet Take 600 mg by mouth 2 (two) times daily. 07/23/14   Historical Provider, MD  glipiZIDE (GLUCOTROL) 10 MG tablet Take 10 mg by mouth 2 (two) times daily. 07/01/14   Historical Provider, MD  HYDROcodone-acetaminophen (NORCO/VICODIN) 5-325 MG per tablet Take 1 tablet by mouth every 6 (six) hours as needed for moderate pain.    Historical Provider, MD   losartan (COZAAR) 100 MG tablet Take 100 mg by mouth daily. 06/24/14   Historical Provider, MD  naproxen (NAPROSYN) 500 MG tablet Take 500 mg by mouth 2 (two) times daily. 08/05/14   Historical Provider, MD  nitroGLYCERIN (NITROSTAT) 0.4 MG SL tablet Place 1 tablet (0.4 mg total) under the tongue every 5 (five) minutes as needed (up to 3 doses). For chest pain 04/05/12   Dayna N Dunn, PA-C  PARoxetine (PAXIL) 40 MG tablet Take 40 mg by mouth every morning. 07/01/14   Historical Provider, MD  zolpidem (AMBIEN) 10 MG tablet Take 10 mg by mouth at bedtime. 07/11/14   Historical Provider, MD   BP 191/87 mmHg  Pulse 73  Temp(Src) 98.4 F (36.9 C) (Oral)  Resp 18  SpO2 97% Physical Exam  Constitutional: She is oriented to person, place, and time. She appears well-developed and well-nourished.  HENT:  Head: Normocephalic and atraumatic.  Right Ear: External ear normal.  Left Ear: External ear normal.  Nose: Nose normal.  Eyes: Right eye exhibits no discharge. Left eye exhibits no discharge.  Cardiovascular: Normal rate, regular rhythm and normal heart sounds.   Pulmonary/Chest: Effort normal and breath sounds normal.  Abdominal: Soft. She exhibits no distension. There is tenderness in the epigastric area. There is no rigidity.  Multiple well healed surgical scars  Neurological: She is alert and oriented to person, place, and time.  Skin: Skin is warm and dry.  Nursing note and vitals reviewed.   ED Course  Procedures (including critical care time) Labs Review Labs Reviewed  LIPASE, BLOOD - Abnormal; Notable for the following:    Lipase 117 (*)    All other components within normal limits  COMPREHENSIVE METABOLIC PANEL - Abnormal; Notable for the following:    CO2 21 (*)    Glucose, Bld 405 (*)    Calcium 10.4 (*)    AST 44 (*)    ALT 56 (*)    Total Bilirubin 1.5 (*)    All other components within normal limits  CBC - Abnormal; Notable for the following:    Hemoglobin 15.3 (*)     All other components within normal limits  URINALYSIS, ROUTINE W REFLEX MICROSCOPIC (NOT AT [facility]) - Abnormal; Notable for the following:    Glucose, UA >1000 (*)    All other components within normal limits  URINE MICROSCOPIC-ADD ON - Abnormal; Notable for the following:    Squamous Epithelial / LPF 0-5 (*)    All other components within normal limits  I-STAT TROPOININ, ED    Imaging Review Dg Chest 2 View  12/05/2015  CLINICAL DATA:  Chest pain and epigastric pain. EXAM: CHEST  2 VIEW COMPARISON:  07/29/2014 FINDINGS: CABG. Calcification in the aorta. Heart size and pulmonary vascularity are normal. The lungs are clear. No effusions. No  bone abnormality. No free air under the diaphragm.  Prior cholecystectomy. IMPRESSION: No acute abnormality.  Aortic atherosclerosis. Electronically Signed   By: Lorriane Shire M.D.   On: 12/05/2015 09:15   Ct Abdomen Pelvis W Contrast  12/05/2015  CLINICAL DATA:  Midline abdominal pain. EXAM: CT ABDOMEN AND PELVIS WITH CONTRAST TECHNIQUE: Multidetector CT imaging of the abdomen and pelvis was performed using the standard protocol following bolus administration of intravenous contrast. CONTRAST:  96mL ISOVUE-300 IOPAMIDOL (ISOVUE-300) INJECTION 61% COMPARISON:  09/13/2012 FINDINGS: Lower chest: Heart is borderline in size. Lung bases are clear. No effusions. Hepatobiliary: Prior cholecystectomy.  No focal hepatic abnormality. Pancreas: No focal abnormality or ductal dilatation. Spleen: No focal abnormality.  Normal size. Adrenals/Urinary Tract: Small scattered hypodensities in the kidneys compatible with small cysts. No hydronephrosis or suspicious mass. Adrenal glands and bladder unremarkable. Stomach/Bowel: Appendix is normal. Stomach, large bowel unremarkable. Duodenal diverticulum noted measuring approximately 3 cm. Small bowel otherwise unremarkable. Vascular/Lymphatic: Densely calcified aorta and iliac vessels. No aneurysm. Reproductive: Prior hysterectomy.   No adnexal masses. Other: No free fluid or free air. Musculoskeletal: No acute bony abnormality or focal bone lesion. IMPRESSION: No acute findings in the abdomen or pelvis. Electronically Signed   By: Rolm Baptise M.D.   On: 12/05/2015 12:12   I have personally reviewed and evaluated these images and lab results as part of my medical decision-making.   EKG Interpretation   Date/Time:  Friday December 05 2015 08:52:20 EDT Ventricular Rate:  74 PR Interval:  144 QRS Duration: 94 QT Interval:  444 QTC Calculation: 492 R Axis:   23 Text Interpretation:  Normal sinus rhythm Minimal voltage criteria for  LVH, may be normal variant Cannot rule out Anterior infarct , age  undetermined Abnormal ECG limited due to wandering baseline Confirmed by  Giulian Goldring  MD, Sevrin Sally ([id number]) on 12/05/2015 9:07:01 AM      MDM   Final diagnoses:  Upper abdominal pain    Patient's pain improved with GI cocktail. Given her mild elevated lipase and chronic pain, CT obtained to rule out pancreatitis and/or obvious malignancy. CT does not show any obvious findings. Likely this is gastric in nature. She tells me she is on Prilosec and Pepcid but did not report this to the pharmacy tech. We'll start her on this just in case. She tells me she has GI follow-up in 3 days already. Encouraged her to keep this follow-up, she will likely need an EGD. Her pain does radiate up into her chest but I highly doubt this is cardiac related, PE, or dissection given chronicity.    Sherwood Gambler, MD 12/05/15 1331

## 2015-12-08 ENCOUNTER — Ambulatory Visit (INDEPENDENT_AMBULATORY_CARE_PROVIDER_SITE_OTHER): Payer: Medicare Other | Admitting: Gastroenterology

## 2015-12-08 ENCOUNTER — Encounter: Payer: Self-pay | Admitting: Gastroenterology

## 2015-12-08 VITALS — BP 140/80 | HR 84 | Ht 63.0 in | Wt 151.2 lb

## 2015-12-08 DIAGNOSIS — R1013 Epigastric pain: Secondary | ICD-10-CM | POA: Diagnosis not present

## 2015-12-08 DIAGNOSIS — G8929 Other chronic pain: Secondary | ICD-10-CM | POA: Diagnosis not present

## 2015-12-08 NOTE — Progress Notes (Signed)
[facility] Consult Note:  History: [name] 12/08/2015  Referring physician: Curlene Labrum, MD  Reason for consult/chief complaint: Gastroesophageal Reflux; Abdominal Pain; Nausea; and Weight Loss   Subjective HPI:  Saw Dr Deatra Ina in 2012 for dyspepsia - normal EGD and GES Saw Kaplan 2014 for elevated LFTs  This patient sees me for the first time, having previously seen Dr. Deatra Ina. Her last visit was in February 2014. She describes over a year of slowly worsening upper abdominal pain. It is described as aching and constant, both day and night. It might be a little worse after eating, otherwise no clear triggers or relieving factors. She came to the ED 3 days ago because there was no available office time. I reviewed the EGD providers noted in the available workup. Her glucose was 405, mild elevation of AST and ALT, normal CT scan abdomen and pelvis. She has some associated nausea, no vomiting, but she has 12 pound unanticipated weight loss in the last several months. Sounds like her appetite is variable. She had no new meds prior to the onset of symptoms, but has been under significant stress with her daughter sick with lung cancer diagnosed last May.     ROS:  Review of Systems  Constitutional: Positive for unexpected weight change. Negative for appetite change.  HENT: Negative for mouth sores and voice change.   Eyes: Negative for pain and redness.  Respiratory: Negative for cough and shortness of breath.   Cardiovascular: Negative for chest pain and palpitations.  Genitourinary: Negative for dysuria and hematuria.  Musculoskeletal: Negative for myalgias and arthralgias.  Skin: Negative for pallor and rash.  Neurological: Negative for weakness and headaches.  Hematological: Negative for adenopathy.  Psychiatric/Behavioral: The patient is nervous/anxious.      Past Medical History: Past Medical History  Diagnosis Date  . Hypertension   .  Hyperlipidemia     a. Not on statin due to elevated liver enzymes.  . Coronary artery disease status post CABG 2005     a. s/p CABG 2005. b. 2011 - DES of SVG to PDA. c. Admitted with CP 03/2012 with stable anatomy by cath, felt noncardiac pain.  . Acute myocardial infarction, unspecified site, episode of care unspecified   . Esophageal reflux   . Diabetes mellitus   . GERD (gastroesophageal reflux disease)   . Depression   . History of cholecystectomy   . Hernia   . Elevated transaminase level     Most recently October 2012 most recent  . Kidney cysts     left  . Elevated LFTs    Normal colonoscopy 2011  Past Surgical History: Past Surgical History  Procedure Laterality Date  . Bypass graft  08/2003    Quadruple   . Abdominal hysterectomy    . Cholecystectomy    . Hernia repair Right 01/2011  . Left heart catheterization with coronary/graft angiogram N/A 04/05/2012    Procedure: LEFT HEART CATHETERIZATION WITH Beatrix Fetters;  Surgeon: Sherren Mocha, MD;  Location: [facility] CATH LAB;  Service: Cardiovascular;  Laterality: N/A;     Family History: Family History  Problem Relation Age of Onset  . Cancer Mother     stomach  . Diabetes Mother   . Heart attack Brother   . Addison's disease Sister   . Colon cancer Neg Hx   . Lung cancer Daughter     smoker    Social History: Social History   Social History  . Marital Status: Married    Spouse  Name: Glendell Docker  . Number of Children: 2  . Years of Education: N/A   Occupational History  . Part-Time hair-dresser    Social History Main Topics  . Smoking status: Never Smoker   . Smokeless tobacco: Never Used  . Alcohol Use: No  . Drug Use: No  . Sexual Activity: Not Asked   Other Topics Concern  . None   Social History Narrative    Allergies: No Known Allergies  Outpatient Meds: Current Outpatient Prescriptions  Medication Sig Dispense Refill  . amLODipine (NORVASC) 5 MG tablet Take 1 tablet (5 mg total)  by mouth daily. 30 tablet 12  . atenolol (TENORMIN) 50 MG tablet Take 75 mg by mouth daily.     . cloNIDine (CATAPRES) 0.2 MG tablet Take 1 tablet (0.2 mg total) by mouth at bedtime. 30 tablet 12  . famotidine (PEPCID) 20 MG tablet Take 1 tablet (20 mg total) by mouth 2 (two) times daily. 10 tablet 0  . furosemide (LASIX) 40 MG tablet Take 40 mg by mouth daily.      Marland Kitchen gemfibrozil (LOPID) 600 MG tablet Take 600 mg by mouth 2 (two) times daily.    Marland Kitchen glipiZIDE (GLUCOTROL) 10 MG tablet Take 10 mg by mouth 2 (two) times daily.    Marland Kitchen HYDROcodone-acetaminophen (NORCO/VICODIN) 5-325 MG per tablet Take 1 tablet by mouth every 6 (six) hours as needed for moderate pain.    Marland Kitchen losartan (COZAAR) 100 MG tablet Take 100 mg by mouth daily.    . nitroGLYCERIN (NITROSTAT) 0.4 MG SL tablet Place 1 tablet (0.4 mg total) under the tongue every 5 (five) minutes as needed (up to 3 doses). For chest pain    . omeprazole (PRILOSEC) 40 MG capsule Take 1 capsule (40 mg total) by mouth daily. 30 capsule 0  . PARoxetine (PAXIL) 40 MG tablet Take 40 mg by mouth every morning.    . zolpidem (AMBIEN) 10 MG tablet Take 10 mg by mouth at bedtime.     No current facility-administered medications for this visit.      ___________________________________________________________________ Objective  Exam:  BP 140/80 mmHg  Pulse 84  Ht 5\' 3"  (1.6 m)  Wt 151 lb 3.2 oz (68.584 kg)  BMI 26.79 kg/m2   General: this is a(n) anxious, tearful  Eyes: sclera anicteric, no redness  ENT: oral mucosa moist without lesions, no cervical or supraclavicular lymphadenopathy, good dentition  CV: RRR without murmur, S1/S2, no JVD, no peripheral edema  Resp: clear to auscultation bilaterally, normal RR and effort noted  GI: soft, mild epigastric tenderness, with active bowel sounds. No guarding or palpable organomegaly noted.  Skin; warm and dry, no rash or jaundice noted  Neuro: awake, alert and oriented x 3. Normal gross motor  function and fluent speech  Labs:  Mild elevated AST/ALT, o/w LFTs normal, CBC nml  Radiologic Studies:  CTAP 12/05/15 nml  Assessment: Encounter Diagnosis  Name Primary?  . Abdominal pain, chronic, epigastric Yes    It sounds likely to be nonulcer dyspepsia/functional abdominal pain. Nonetheless, her weight loss is worrisome.  Plan:  I had an upper endoscopy spot available tomorrow, and she is agreeable.  The benefits and risks of the planned procedure were described in detail with the patient or (when appropriate) their health care proxy.  Risks were outlined as including, but not limited to, bleeding, infection, perforation, adverse medication reaction leading to cardiac or pulmonary decompensation, or pancreatitis (if ERCP).  The limitation of incomplete mucosal visualization was also  discussed.  No guarantees or warranties were given.   If negative, I think perhaps further attention to her affect is disorder may be necessary, since she had an otherwise unremarkable lab and imaging workup.   Nelida Meuse III

## 2015-12-08 NOTE — Patient Instructions (Signed)
You have been scheduled for an endoscopy. Please follow written instructions given to you at your visit today. If you use inhalers (even only as needed), please bring them with you on the day of your procedure. Your physician has requested that you go to www.startemmi.com and enter the access code given to you at your visit today. This web site gives a general overview about your procedure. However, you should still follow specific instructions given to you by our office regarding your preparation for the procedure.  If you are age 71 or older, your body mass index should be between 23-30. Your Body mass index is 26.79 kg/(m^2). If this is out of the aforementioned range listed, please consider follow up with your Primary Care Provider.  Thank you for choosing [facility]  Dr Wilfrid Lund III

## 2015-12-09 ENCOUNTER — Ambulatory Visit (AMBULATORY_SURGERY_CENTER): Payer: Medicare Other | Admitting: Gastroenterology

## 2015-12-09 ENCOUNTER — Encounter: Payer: Self-pay | Admitting: Gastroenterology

## 2015-12-09 VITALS — BP 158/84 | HR 70 | Temp 98.6°F | Resp 12 | Ht 63.0 in | Wt 151.0 lb

## 2015-12-09 DIAGNOSIS — R1013 Epigastric pain: Secondary | ICD-10-CM | POA: Diagnosis not present

## 2015-12-09 LAB — GLUCOSE, CAPILLARY
Glucose-Capillary: 231 mg/dL — ABNORMAL HIGH (ref 65–99)
Glucose-Capillary: 205 mg/dL — ABNORMAL HIGH (ref 65–99)

## 2015-12-09 MED ORDER — SODIUM CHLORIDE 0.9 % IV SOLN: 500.0000 mL | INTRAVENOUS | Status: DC

## 2015-12-09 NOTE — Patient Instructions (Signed)
YOU HAD AN ENDOSCOPIC PROCEDURE TODAY AT THE [facility]:   Refer to the procedure report that was given to you for any specific questions about what was found during the examination.  If the procedure report does not answer your questions, please call your gastroenterologist to clarify.  If you requested that your care partner not be given the details of your procedure findings, then the procedure report has been included in a sealed envelope for you to review at your convenience later.  YOU SHOULD EXPECT: Some feelings of bloating in the abdomen. Passage of more gas than usual.  Walking can help get rid of the air that was put into your GI tract during the procedure and reduce the bloating. If you had a lower endoscopy (such as a colonoscopy or flexible sigmoidoscopy) you may notice spotting of blood in your stool or on the toilet paper. If you underwent a bowel prep for your procedure, you may not have a normal bowel movement for a few days.  Please Note:  You might notice some irritation and congestion in your nose or some drainage.  This is from the oxygen used during your procedure.  There is no need for concern and it should clear up in a day or so.  SYMPTOMS TO REPORT IMMEDIATELY:     Following upper endoscopy (EGD)  Vomiting of blood or coffee ground material  New chest pain or pain under the shoulder blades  Painful or persistently difficult swallowing  New shortness of breath  Fever of 100F or higher  Black, tarry-looking stools  For urgent or emergent issues, a gastroenterologist can be reached at any hour by calling [phone].   DIET: Your first meal following the procedure should be a small meal and then it is ok to progress to your normal diet. Heavy or fried foods are harder to digest and may make you feel nauseous or bloated.  Likewise, meals heavy in dairy and vegetables can increase bloating.  Drink plenty of fluids but you should avoid alcoholic beverages  for 24 hours.  ACTIVITY:  You should plan to take it easy for the rest of today and you should NOT DRIVE or use heavy machinery until tomorrow (because of the sedation medicines used during the test).    FOLLOW UP: Our staff will call the number listed on your records the next business day following your procedure to check on you and address any questions or concerns that you may have regarding the information given to you following your procedure. If we do not reach you, we will leave a message.  However, if you are feeling well and you are not experiencing any problems, there is no need to return our call.  We will assume that you have returned to your regular daily activities without incident.  If any biopsies were taken you will be contacted by phone or by letter within the next 1-3 weeks.  Please call us at [phone] if you have not heard about the biopsies in 3 weeks.    SIGNATURES/CONFIDENTIALITY: You and/or your care partner have signed paperwork which will be entered into your electronic medical record.  These signatures attest to the fact that that the information above on your After Visit Summary has been reviewed and is understood.  Full responsibility of the confidentiality of this discharge information lies with you and/or your care-partner.  Resume medications.  

## 2015-12-09 NOTE — Progress Notes (Signed)
A and Ox 3 Report to RN 

## 2015-12-09 NOTE — Op Note (Signed)
[facility] Patient Name: [name] Procedure Date: 12/09/2015 1:53 PM MRN: [id number] Endoscopist: Mallie Mussel L. Loletha Carrow , MD Age: 70 Date of Birth: June 07, 1946 Gender: Female Procedure:                Upper GI endoscopy Indications:              Epigastric abdominal pain, Abdominal bloating Medicines:                Monitored Anesthesia Care Procedure:                Pre-Anesthesia Assessment:                           - Prior to the procedure, a History and Physical                            was performed, and patient medications and                            allergies were reviewed. The patient's tolerance of                            previous anesthesia was also reviewed. The risks                            and benefits of the procedure and the sedation                            options and risks were discussed with the patient.                            All questions were answered, and informed consent                            was obtained. Prior Anticoagulants: The patient has                            taken no previous anticoagulant or antiplatelet                            agents. ASA Grade Assessment: III - A patient with                            severe systemic disease. After reviewing the risks                            and benefits, the patient was deemed in                            satisfactory condition to undergo the procedure.                           After obtaining informed consent, the endoscope was  passed under direct vision. Throughout the                            procedure, the patient's blood pressure, pulse, and                            oxygen saturations were monitored continuously. The                            Model GIF-HQ190 [id number]) scope was introduced                            through the mouth, and advanced to the second part                            of duodenum. The upper GI endoscopy was                         accomplished without difficulty. The patient                            tolerated the procedure well. Scope In: Scope Out: Findings:                 The esophagus was normal.                           The stomach was normal.                           The examined duodenum was normal. Complications:            No immediate complications. Estimated Blood Loss:     Estimated blood loss: none. Impression:               - Normal esophagus.                           - Normal stomach.                           - Normal examined duodenum.                           - No specimens collected. Recommendation:           - Patient has a contact number available for                            emergencies. The signs and symptoms of potential                            delayed complications were discussed with the                            patient. Return to normal activities tomorrow.  Written discharge instructions were provided to the                            patient.                           - Resume previous diet.                           - Continue present medications.                           - The patient's symptom complex and negative                            lab/imaging and endoscopic workup are most                            consistent with non-ulcer dyspepsia, perhaps                            exacerbated by psychological stress.                           The patient was encouraged to see her PCP to                            discuss the latter issue. Henry L. Loletha Carrow, MD 12/09/2015 2:17:10 PM This report has been signed electronically.

## 2015-12-10 ENCOUNTER — Telehealth: Payer: Self-pay | Admitting: *Deleted

## 2015-12-10 NOTE — Telephone Encounter (Signed)
  Follow up Call-  Call back number 12/09/2015  Post procedure Call Back phone  # [phone]  Permission to leave phone message No     Patient questions:  Do you have a fever, pain , or abdominal swelling? No. Pain Score  0 *  Have you tolerated food without any problems? Yes.    Have you been able to return to your normal activities? Yes.    Do you have any questions about your discharge instructions: Diet   No. Medications  No. Follow up visit  No.  Do you have questions or concerns about your Care? No.  Actions: * If pain score is 4 or above: No action needed, pain <4.

## 2016-03-17 ENCOUNTER — Other Ambulatory Visit (HOSPITAL_COMMUNITY): Payer: Self-pay | Admitting: Family Medicine

## 2016-03-17 DIAGNOSIS — M545 Low back pain: Secondary | ICD-10-CM | POA: Diagnosis not present

## 2016-03-17 DIAGNOSIS — R319 Hematuria, unspecified: Secondary | ICD-10-CM | POA: Diagnosis not present

## 2016-03-17 DIAGNOSIS — R109 Unspecified abdominal pain: Secondary | ICD-10-CM | POA: Diagnosis not present

## 2016-03-17 DIAGNOSIS — Z6825 Body mass index (BMI) 25.0-25.9, adult: Secondary | ICD-10-CM | POA: Diagnosis not present

## 2016-03-19 ENCOUNTER — Ambulatory Visit (HOSPITAL_COMMUNITY)
Admission: RE | Admit: 2016-03-19 | Discharge: 2016-03-19 | Disposition: A | Discharge status: Home / Self Care | Payer: Medicare Other | Source: Ambulatory Visit | Attending: Family Medicine | Admitting: Family Medicine

## 2016-03-19 DIAGNOSIS — R160 Hepatomegaly, not elsewhere classified: Secondary | ICD-10-CM | POA: Diagnosis not present

## 2016-03-19 DIAGNOSIS — R319 Hematuria, unspecified: Secondary | ICD-10-CM | POA: Insufficient documentation

## 2016-03-19 DIAGNOSIS — M545 Low back pain: Secondary | ICD-10-CM | POA: Diagnosis not present

## 2016-03-19 DIAGNOSIS — Z9049 Acquired absence of other specified parts of digestive tract: Secondary | ICD-10-CM | POA: Insufficient documentation

## 2016-03-19 DIAGNOSIS — R109 Unspecified abdominal pain: Secondary | ICD-10-CM | POA: Insufficient documentation

## 2016-03-22 DIAGNOSIS — E1165 Type 2 diabetes mellitus with hyperglycemia: Secondary | ICD-10-CM | POA: Diagnosis not present

## 2016-03-22 DIAGNOSIS — I10 Essential (primary) hypertension: Secondary | ICD-10-CM | POA: Diagnosis not present

## 2016-03-31 DIAGNOSIS — M545 Low back pain: Secondary | ICD-10-CM | POA: Diagnosis not present

## 2016-03-31 DIAGNOSIS — F411 Generalized anxiety disorder: Secondary | ICD-10-CM | POA: Diagnosis not present

## 2016-03-31 DIAGNOSIS — I10 Essential (primary) hypertension: Secondary | ICD-10-CM | POA: Diagnosis not present

## 2016-03-31 DIAGNOSIS — F3289 Other specified depressive episodes: Secondary | ICD-10-CM | POA: Diagnosis not present

## 2016-03-31 DIAGNOSIS — E782 Mixed hyperlipidemia: Secondary | ICD-10-CM | POA: Diagnosis not present

## 2016-03-31 DIAGNOSIS — Z6826 Body mass index (BMI) 26.0-26.9, adult: Secondary | ICD-10-CM | POA: Diagnosis not present

## 2016-03-31 DIAGNOSIS — E1165 Type 2 diabetes mellitus with hyperglycemia: Secondary | ICD-10-CM | POA: Diagnosis not present

## 2016-04-02 ENCOUNTER — Other Ambulatory Visit: Payer: Self-pay

## 2016-04-13 DIAGNOSIS — Z0001 Encounter for general adult medical examination with abnormal findings: Secondary | ICD-10-CM | POA: Diagnosis not present

## 2016-04-13 DIAGNOSIS — E1165 Type 2 diabetes mellitus with hyperglycemia: Secondary | ICD-10-CM | POA: Diagnosis not present

## 2016-04-13 DIAGNOSIS — M545 Low back pain: Secondary | ICD-10-CM | POA: Diagnosis not present

## 2016-04-13 DIAGNOSIS — Z6826 Body mass index (BMI) 26.0-26.9, adult: Secondary | ICD-10-CM | POA: Diagnosis not present

## 2016-04-13 DIAGNOSIS — K760 Fatty (change of) liver, not elsewhere classified: Secondary | ICD-10-CM | POA: Diagnosis not present

## 2016-04-13 DIAGNOSIS — K219 Gastro-esophageal reflux disease without esophagitis: Secondary | ICD-10-CM | POA: Diagnosis not present

## 2016-04-13 DIAGNOSIS — I10 Essential (primary) hypertension: Secondary | ICD-10-CM | POA: Diagnosis not present

## 2016-04-13 DIAGNOSIS — E782 Mixed hyperlipidemia: Secondary | ICD-10-CM | POA: Diagnosis not present

## 2016-04-14 DIAGNOSIS — M545 Low back pain: Secondary | ICD-10-CM | POA: Diagnosis not present

## 2016-04-21 DIAGNOSIS — M545 Low back pain: Secondary | ICD-10-CM | POA: Diagnosis not present

## 2016-04-26 NOTE — Progress Notes (Signed)
HPI: FU coronary artery disease status post coronary bypassing graft in 2005. She has had prior PCI of the saphenous vein graft to the right coronary artery. He has had problems with recurrent chest pain. Cardiac catheterization in August of 2013 revealed occlusion of all 3 native arteries. The saphenous vein graft to the first and second marginal was patent. The saphenous vein graft to the PDA was patent. The LIMA to the LAD was patent. Ejection fraction 55-65%. Since last seen, the patient denies any dyspnea on exertion, orthopnea, PND, pedal edema, palpitations, syncope or chest pain.  Current Outpatient Prescriptions  Medication Sig Dispense Refill  . amLODipine (NORVASC) 5 MG tablet Take 1 tablet (5 mg total) by mouth daily. 30 tablet 12  . atenolol (TENORMIN) 50 MG tablet Take 75 mg by mouth daily.     . cloNIDine (CATAPRES) 0.2 MG tablet Take 1 tablet (0.2 mg total) by mouth at bedtime. 30 tablet 12  . famotidine (PEPCID) 20 MG tablet Take 1 tablet (20 mg total) by mouth 2 (two) times daily. 10 tablet 0  . furosemide (LASIX) 40 MG tablet Take 40 mg by mouth daily.      Marland Kitchen gemfibrozil (LOPID) 600 MG tablet Take 600 mg by mouth 2 (two) times daily.    Marland Kitchen glipiZIDE (GLUCOTROL) 10 MG tablet Take 10 mg by mouth 2 (two) times daily.    Marland Kitchen HYDROcodone-acetaminophen (NORCO/VICODIN) 5-325 MG per tablet Take 1 tablet by mouth every 6 (six) hours as needed for moderate pain.    Marland Kitchen JANUVIA 50 MG tablet     . losartan (COZAAR) 100 MG tablet Take 100 mg by mouth daily.    . nitroGLYCERIN (NITROSTAT) 0.4 MG SL tablet Place 1 tablet (0.4 mg total) under the tongue every 5 (five) minutes as needed (up to 3 doses). For chest pain    . omeprazole (PRILOSEC) 40 MG capsule Take 1 capsule (40 mg total) by mouth daily. 30 capsule 0  . PARoxetine (PAXIL) 40 MG tablet Take 40 mg by mouth every morning.    . zolpidem (AMBIEN) 10 MG tablet Take 10 mg by mouth at bedtime.     No current facility-administered  medications for this visit.      Past Medical History:  Diagnosis Date  . Acute myocardial infarction, unspecified site, episode of care unspecified   . Coronary artery disease status post CABG 2005    a. s/p CABG 2005. b. 2011 - DES of SVG to PDA. c. Admitted with CP 03/2012 with stable anatomy by cath, felt noncardiac pain.  . Depression   . Diabetes mellitus   . Elevated LFTs   . Elevated transaminase level    Most recently October 2012 most recent  . Esophageal reflux   . GERD (gastroesophageal reflux disease)   . Hernia   . History of cholecystectomy   . Hyperlipidemia    a. Not on statin due to elevated liver enzymes.  . Hypertension   . Kidney cysts    left    Past Surgical History:  Procedure Laterality Date  . ABDOMINAL HYSTERECTOMY    . BYPASS GRAFT  08/2003   Quadruple   . CHOLECYSTECTOMY    . HERNIA REPAIR Right 01/2011  . LEFT HEART CATHETERIZATION WITH CORONARY/GRAFT ANGIOGRAM N/A 04/05/2012   Procedure: LEFT HEART CATHETERIZATION WITH Beatrix Fetters;  Surgeon: Sherren Mocha, MD;  Location: [facility] CATH LAB;  Service: Cardiovascular;  Laterality: N/A;    Social History   Social History  .  Marital status: Married    Spouse name: Glendell Docker  . Number of children: 2  . Years of education: N/A   Occupational History  . Part-Time hair-dresser    Social History Main Topics  . Smoking status: Never Smoker  . Smokeless tobacco: Never Used  . Alcohol use No  . Drug use: No  . Sexual activity: Not on file   Other Topics Concern  . Not on file   Social History Narrative  . No narrative on file    Family History  Problem Relation Age of Onset  . Cancer Mother     stomach  . Diabetes Mother   . Heart attack Brother   . Addison's disease Sister   . Lung cancer Daughter     smoker  . Colon cancer Neg Hx     ROS: back pain but no fevers or chills, productive cough, hemoptysis, dysphasia, odynophagia, melena, hematochezia, dysuria, hematuria, rash,  seizure activity, orthopnea, PND, pedal edema, claudication. Remaining systems are negative.  Physical Exam: Well-developed well-nourished in no acute distress.  Skin is warm and dry.  HEENT is normal.  Neck is supple.  Chest is clear to auscultation with normal expansion.  Cardiovascular exam is regular rate and rhythm.  Abdominal exam nontender or distended. No masses palpated. Extremities show no edema. neuro grossly intact  ECG-sinus rhythm at a rate of 64. Cannot rule out prior anterior infarct.  A/P  1 coronary artery disease-no recent chest pain. She has declined statins previously. She has had some difficulties with aspirin causing GI upset. I asked her to take 81 mg of enteric-coated aspirin daily and she is willing. If she cannot tolerate we would recommend Plavix. However she would prefer not to take that medication.  2 hyperlipidemia-continue diet. Declines statins.  3 hypertension-blood pressure controlled. Continue present medications.  Kirk Ruths, MD

## 2016-04-27 DIAGNOSIS — M545 Low back pain: Secondary | ICD-10-CM | POA: Diagnosis not present

## 2016-04-28 ENCOUNTER — Encounter: Payer: Self-pay | Admitting: Cardiology

## 2016-04-28 ENCOUNTER — Ambulatory Visit (INDEPENDENT_AMBULATORY_CARE_PROVIDER_SITE_OTHER): Payer: Medicare Other | Admitting: Cardiology

## 2016-04-28 VITALS — BP 132/80 | HR 64 | Ht 63.0 in | Wt 157.0 lb

## 2016-04-28 DIAGNOSIS — I10 Essential (primary) hypertension: Secondary | ICD-10-CM

## 2016-04-28 DIAGNOSIS — I25810 Atherosclerosis of coronary artery bypass graft(s) without angina pectoris: Secondary | ICD-10-CM

## 2016-04-28 DIAGNOSIS — E785 Hyperlipidemia, unspecified: Secondary | ICD-10-CM | POA: Diagnosis not present

## 2016-04-28 MED ORDER — ASPIRIN EC 81 MG PO TBEC: 81.0000 mg | DELAYED_RELEASE_TABLET | Freq: Every day | ORAL | 3 refills | Status: DC

## 2016-04-28 NOTE — Patient Instructions (Signed)
Medication Instructions:   START ASPIRIN 81 MG ONCE DAILY WITH FOOD  Follow-Up:  Your physician wants you to follow-up in: [place] will receive a reminder letter in the mail two months in advance. If you don't receive a letter, please call our office to schedule the follow-up appointment.   If you need a refill on your cardiac medications before your next appointment, please call your pharmacy.

## 2016-05-05 DIAGNOSIS — M545 Low back pain: Secondary | ICD-10-CM | POA: Diagnosis not present

## 2016-05-10 DIAGNOSIS — L259 Unspecified contact dermatitis, unspecified cause: Secondary | ICD-10-CM | POA: Diagnosis not present

## 2016-05-10 DIAGNOSIS — L570 Actinic keratosis: Secondary | ICD-10-CM | POA: Diagnosis not present

## 2016-06-18 ENCOUNTER — Emergency Department (HOSPITAL_COMMUNITY): Payer: Medicare Other

## 2016-06-18 ENCOUNTER — Emergency Department (HOSPITAL_COMMUNITY)
Admission: EM | Admit: 2016-06-18 | Discharge: 2016-06-18 | Disposition: A | Discharge status: Home / Self Care | Payer: Medicare Other | Attending: Emergency Medicine | Admitting: Emergency Medicine

## 2016-06-18 ENCOUNTER — Encounter (HOSPITAL_COMMUNITY): Payer: Self-pay | Admitting: *Deleted

## 2016-06-18 DIAGNOSIS — Z7982 Long term (current) use of aspirin: Secondary | ICD-10-CM | POA: Insufficient documentation

## 2016-06-18 DIAGNOSIS — M5134 Other intervertebral disc degeneration, thoracic region: Secondary | ICD-10-CM | POA: Diagnosis not present

## 2016-06-18 DIAGNOSIS — E119 Type 2 diabetes mellitus without complications: Secondary | ICD-10-CM | POA: Insufficient documentation

## 2016-06-18 DIAGNOSIS — M5124 Other intervertebral disc displacement, thoracic region: Secondary | ICD-10-CM | POA: Insufficient documentation

## 2016-06-18 DIAGNOSIS — I252 Old myocardial infarction: Secondary | ICD-10-CM | POA: Insufficient documentation

## 2016-06-18 DIAGNOSIS — I10 Essential (primary) hypertension: Secondary | ICD-10-CM | POA: Diagnosis not present

## 2016-06-18 DIAGNOSIS — R1010 Upper abdominal pain, unspecified: Secondary | ICD-10-CM | POA: Diagnosis not present

## 2016-06-18 DIAGNOSIS — I2510 Atherosclerotic heart disease of native coronary artery without angina pectoris: Secondary | ICD-10-CM | POA: Diagnosis not present

## 2016-06-18 DIAGNOSIS — Z951 Presence of aortocoronary bypass graft: Secondary | ICD-10-CM | POA: Diagnosis not present

## 2016-06-18 DIAGNOSIS — Z7984 Long term (current) use of oral hypoglycemic drugs: Secondary | ICD-10-CM | POA: Diagnosis not present

## 2016-06-18 DIAGNOSIS — Z79899 Other long term (current) drug therapy: Secondary | ICD-10-CM | POA: Insufficient documentation

## 2016-06-18 DIAGNOSIS — M549 Dorsalgia, unspecified: Secondary | ICD-10-CM | POA: Diagnosis present

## 2016-06-18 DIAGNOSIS — M546 Pain in thoracic spine: Secondary | ICD-10-CM | POA: Diagnosis not present

## 2016-06-18 LAB — CBC
HCT: 38.7 % (ref 36.0–46.0)
Hemoglobin: 14.1 g/dL (ref 12.0–15.0)
MCH: 32.5 pg (ref 26.0–34.0)
MCHC: 36.4 g/dL — ABNORMAL HIGH (ref 30.0–36.0)
MCV: 89.2 fL (ref 78.0–100.0)
PLATELETS: 195 10*3/uL (ref 150–400)
RBC: 4.34 MIL/uL (ref 3.87–5.11)
RDW: 12.5 % (ref 11.5–15.5)
WBC: 4.1 10*3/uL (ref 4.0–10.5)

## 2016-06-18 LAB — URINALYSIS, ROUTINE W REFLEX MICROSCOPIC
Bilirubin Urine: NEGATIVE
Hgb urine dipstick: NEGATIVE
KETONES UR: NEGATIVE mg/dL
LEUKOCYTES UA: NEGATIVE
NITRITE: NEGATIVE
PROTEIN: NEGATIVE mg/dL
Specific Gravity, Urine: 1.011 (ref 1.005–1.030)
pH: 5.5 (ref 5.0–8.0)

## 2016-06-18 LAB — COMPREHENSIVE METABOLIC PANEL
ALT: 50 U/L (ref 14–54)
AST: 36 U/L (ref 15–41)
Albumin: 4.6 g/dL (ref 3.5–5.0)
Alkaline Phosphatase: 124 U/L (ref 38–126)
Anion gap: 13 (ref 5–15)
BILIRUBIN TOTAL: 1 mg/dL (ref 0.3–1.2)
BUN: 12 mg/dL (ref 6–20)
CO2: 21 mmol/L — ABNORMAL LOW (ref 22–32)
CREATININE: 0.84 mg/dL (ref 0.44–1.00)
Calcium: 9.8 mg/dL (ref 8.9–10.3)
Chloride: 102 mmol/L (ref 101–111)
GFR calc Af Amer: >60 mL/min (ref >60)
Glucose, Bld: 319 mg/dL — ABNORMAL HIGH (ref 65–99)
Potassium: 3.7 mmol/L (ref 3.5–5.1)
Sodium: 136 mmol/L (ref 135–145)
TOTAL PROTEIN: 6.9 g/dL (ref 6.5–8.1)

## 2016-06-18 LAB — URINE MICROSCOPIC-ADD ON
Bacteria, UA: NONE SEEN
Squamous Epithelial / LPF: NONE SEEN

## 2016-06-18 LAB — LIPASE, BLOOD: Lipase: 50 U/L (ref 11–51)

## 2016-06-18 MED ORDER — MELOXICAM 7.5 MG PO TABS: 7.5000 mg | ORAL_TABLET | Freq: Every day | ORAL | 0 refills | Status: DC

## 2016-06-18 MED ORDER — CYCLOBENZAPRINE HCL 10 MG PO TABS: 10.0000 mg | ORAL_TABLET | Freq: Every day | ORAL | 0 refills | Status: DC

## 2016-06-18 NOTE — ED Provider Notes (Signed)
[facility] DEPT Provider Note   CSN: [id number] Arrival date & time: 06/18/16  1511     History   Chief Complaint Chief Complaint  Patient presents with  . Flank Pain    HPI [name] is a 70 y.o. female.  Patient is a 70 year old female with a history of diabetes, coronary artery disease status post CABG, hypertension, hyperlipidemia and ongoing chronic back/side/abdominal pain. Patient states for the last year she's had these problems and has seen her doctor intermittently without any determination of what's causing her symptoms. She has had CAT scans, ultrasounds and seen GI for endoscopy without acute cause of pain. She went through a course of physical therapy and continues to do the exercises at home without significant help. 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 However she is only doing that part time now. For some time now she's been taking hydrocodone intermittently for her pain which helps for 4-5 hours and then the pain returns. She does not think she's ever been on a course of anti-inflammatories or muscle relaxers.  She denies any new acute weight loss and states she does intermittently get nausea but is not associated with pain. She denies any shortness of breath, chest pain or cough. Patient does not use tobacco products. She denies any lower abdominal pain or urinary symptoms. No fevers or rash. She's lived in this area her whole life and no known tick exposure.   The history is provided by the patient.  Flank Pain  This is a chronic problem. Episode onset: 1 year. The problem occurs constantly. The problem has not changed since onset.Associated symptoms comments: Pain starts in the back and wraps around to the right side but in the last 2 months has started wrapping around to the left side. Exacerbated by: maybe worsened by standing all day but does not seem to  improve or change with eating. Relieved by: hydrocodone makes it better for a while. Treatments tried: PT, vicodin. The treatment provided mild relief.    Past Medical History:  Diagnosis Date  . Acute myocardial infarction, unspecified site, episode of care unspecified   . Coronary artery disease status post CABG 2005    a. s/p CABG 2005. b. 2011 - DES of SVG to PDA. c. Admitted with CP 03/2012 with stable anatomy by cath, felt noncardiac pain.  . Depression   . Diabetes mellitus   . Elevated LFTs   . Elevated transaminase level    Most recently October 2012 most recent  . Esophageal reflux   . GERD (gastroesophageal reflux disease)   . Hernia   . History of cholecystectomy   . Hyperlipidemia    a. Not on statin due to elevated liver enzymes.  . Hypertension   . Kidney cysts    left    Patient Active Problem List   Diagnosis Date Noted  . Nausea alone 09/20/2012  . Nonspecific abnormal results of liver function study 09/20/2012  . Elevated transaminase level   . Dyspepsia and other specified disorders of function of stomach 05/18/2011  . FATTY LIVER DISEASE 01/09/2010  . Hyperlipidemia 01/25/2009  . DEPRESSION 01/25/2009  . Essential hypertension 01/25/2009  . CAD 01/25/2009  . BACK PAIN 01/25/2009  . HEADACHE 01/25/2009  . Chest pain 01/25/2009  . GERD 09/03/2008  . DYSPHAGIA 09/03/2008  . DIABETES MELLITUS, BORDERLINE 09/03/2008    Past Surgical  History:  Procedure Laterality Date  . ABDOMINAL HYSTERECTOMY    . BYPASS GRAFT  08/2003   Quadruple   . CHOLECYSTECTOMY    . HERNIA REPAIR Right 01/2011  . LEFT HEART CATHETERIZATION WITH CORONARY/GRAFT ANGIOGRAM N/A 04/05/2012   Procedure: LEFT HEART CATHETERIZATION WITH Beatrix Fetters;  Surgeon: Sherren Mocha, MD;  Location: [facility] CATH LAB;  Service: Cardiovascular;  Laterality: N/A;    OB History    No data available       Home Medications    Prior to Admission medications   Medication Sig Start Date  End Date Taking? Authorizing Provider  amLODipine (NORVASC) 5 MG tablet Take 1 tablet (5 mg total) by mouth daily. 08/19/14  Yes Lelon Perla, MD  aspirin EC 81 MG tablet Take 1 tablet (81 mg total) by mouth daily. 04/28/16  Yes Lelon Perla, MD  atenolol (TENORMIN) 50 MG tablet Take 75 mg by mouth daily.    Yes Historical Provider, MD  cloNIDine (CATAPRES) 0.2 MG tablet Take 1 tablet (0.2 mg total) by mouth at bedtime. 10/04/12  Yes Lelon Perla, MD  famotidine (PEPCID) 20 MG tablet Take 1 tablet (20 mg total) by mouth 2 (two) times daily. 12/05/15  Yes Sherwood Gambler, MD  furosemide (LASIX) 40 MG tablet Take 40 mg by mouth daily.     Yes Historical Provider, MD  gemfibrozil (LOPID) 600 MG tablet Take 600 mg by mouth 2 (two) times daily. 07/23/14  Yes Historical Provider, MD  glipiZIDE (GLUCOTROL) 10 MG tablet Take 10 mg by mouth 2 (two) times daily. 07/01/14  Yes Historical Provider, MD  HYDROcodone-acetaminophen (NORCO/VICODIN) 5-325 MG per tablet Take 1 tablet by mouth every 6 (six) hours as needed for moderate pain.   Yes Historical Provider, MD  JANUVIA 50 MG tablet Take 50 mg by mouth daily.  10/29/15  Yes Historical Provider, MD  losartan (COZAAR) 100 MG tablet Take 100 mg by mouth daily. 06/24/14  Yes Historical Provider, MD  nitroGLYCERIN (NITROSTAT) 0.4 MG SL tablet Place 1 tablet (0.4 mg total) under the tongue every 5 (five) minutes as needed (up to 3 doses). For chest pain 04/05/12  Yes Dayna N Dunn, PA-C  omeprazole (PRILOSEC) 40 MG capsule Take 1 capsule (40 mg total) by mouth daily. 12/05/15  Yes Sherwood Gambler, MD  PARoxetine (PAXIL) 40 MG tablet Take 40 mg by mouth every morning. 07/01/14  Yes Historical Provider, MD  zolpidem (AMBIEN) 10 MG tablet Take 10 mg by mouth at bedtime. 07/11/14  Yes Historical Provider, MD    Family History Family History  Problem Relation Age of Onset  . Cancer Mother     stomach  . Diabetes Mother   . Heart attack Brother   . Addison's  disease Sister   . Lung cancer Daughter     smoker  . Colon cancer Neg Hx     Social History Social History  Substance Use Topics  . Smoking status: Never Smoker  . Smokeless tobacco: Never Used  . Alcohol use No     Allergies   Patient has no known allergies.   Review of Systems Review of Systems  Genitourinary: Positive for flank pain.  All other systems reviewed and are negative.    Physical Exam Updated Vital Signs BP 128/85   Pulse 65   Temp 98.2 F (36.8 C) (Oral)   Resp 15   Ht 5\' 3"  (1.6 m)   Wt 155 lb (70.3 kg)   SpO2 99%  BMI 27.46 kg/m   Physical Exam  Constitutional: She is oriented to person, place, and time. She appears well-developed and well-nourished. No distress.  HENT:  Head: Normocephalic and atraumatic.  Mouth/Throat: Oropharynx is clear and moist.  Eyes: Conjunctivae and EOM are normal. Pupils are equal, round, and reactive to light.  Neck: Normal range of motion. Neck supple.  Cardiovascular: Normal rate, regular rhythm and intact distal pulses.   No murmur heard. Pulmonary/Chest: Effort normal and breath sounds normal. No respiratory distress. She has no wheezes. She has no rales.  Abdominal: Soft. She exhibits no distension. There is tenderness. There is no rebound and no guarding.  Mild tenderness over the abd scar in the right upper abd  Musculoskeletal: Normal range of motion. She exhibits no edema or tenderness.       Back:  Neurological: She is alert and oriented to person, place, and time.  Skin: Skin is warm and dry. No rash noted. No erythema.  Psychiatric: She has a normal mood and affect. Her behavior is normal.  Nursing note and vitals reviewed.    ED Treatments / Results  Labs (all labs ordered are listed, but only abnormal results are displayed) Labs Reviewed  COMPREHENSIVE METABOLIC PANEL - Abnormal; Notable for the following:       Result Value   CO2 21 (*)    Glucose, Bld 319 (*)    All other components  within normal limits  CBC - Abnormal; Notable for the following:    MCHC 36.4 (*)    All other components within normal limits  URINALYSIS, ROUTINE W REFLEX MICROSCOPIC (NOT AT [facility]) - Abnormal; Notable for the following:    Glucose, UA >1000 (*)    All other components within normal limits  LIPASE, BLOOD  URINE MICROSCOPIC-ADD ON    EKG  EKG Interpretation  Date/Time:  Friday June 18 2016 15:36:01 EST Ventricular Rate:  79 PR Interval:  162 QRS Duration: 86 QT Interval:  420 QTC Calculation: 481 R Axis:   9 Text Interpretation:  Normal sinus rhythm Possible Left atrial enlargement Cannot rule out Anterior infarct , age undetermined No significant change since last tracing Confirmed by [facility]  MD, Jevon Shells ([id number]) on 06/18/2016 5:51:29 PM       Radiology Dg Chest 2 View  Result Date: 06/18/2016 CLINICAL DATA:  Right upper abdominal pain radiating to the right upper back. EXAM: CHEST  2 VIEW COMPARISON:  None. FINDINGS: The heart size and mediastinal contours are within normal limits. Both lungs are clear. The visualized skeletal structures are unremarkable. IMPRESSION: No active cardiopulmonary disease. Electronically Signed   By: Dorise Bullion III M.D   On: 06/18/2016 19:03   Ct Thoracic Spine Wo Contrast  Result Date: 06/18/2016 CLINICAL DATA:  Initial evaluation for are mid and lower back pain for over 1 year. EXAM: CT THORACIC SPINE WITHOUT CONTRAST TECHNIQUE: Multidetector CT imaging of the thoracic spine was performed without intravenous contrast administration. Multiplanar CT image reconstructions were also generated. COMPARISON:  None. FINDINGS: Alignment: Vertebral bodies normally aligned with preservation of the normal thoracic kyphosis. No listhesis. Vertebrae: Vertebral body heights well maintained. No evidence for acute, subacute, or chronic fracture. No focal osseous lesions. Paraspinal and other soft tissues: Paraspinous soft tissues within normal limits.  Scattered atelectatic changes present within the visualized lungs. Visualized lungs are otherwise clear. Moderate atheromatous plaque within the visualized aorta. Coronary artery calcifications partially visualized. Visualized visceral structures otherwise unremarkable. Disc levels: Degenerative spondylolysis noted within the visualized lower  cervical spine at C5-6 and C6-7. Mild scattered degenerative endplate spurring seen within the mid and lower thoracic spine. T2-3:  Small central disc protrusion without stenosis. T3-4: Central/right paracentral disc protrusion indents the right ventral thecal sac. No significant canal stenosis. Probable cephalad migration of disc material posterior to the T3 vertebral body (series 8, image 32). T5-6: Small central disc protrusion indents the ventral thecal sac without stenosis (series 4, image 69). T6-7: Degenerative disc desiccation with small central disc protrusion (series 4, image 80). No stenosis. The T7-8: Degenerative disc desiccation. Small right paracentral disc protrusion without significant stenosis (series 4, image 91). T8-9: Left paracentral disc protrusion without significant stenosis (series 8, image 38). No other significant degenerative changes identified. IMPRESSION: 1. No acute abnormality within the thoracic spine. No evidence for subacute or chronic fracture. 2. Multilevel degenerative disc disease with several disc protrusions involving the thoracic spine as above, essentially extending from the T3-4 through the T8-9 levels. Disc protrusions are most notable at the T3-4 and T8-9 levels. No significant stenosis. 3. Moderate atheromatous plaque throughout the visualized aorta with prominent coronary artery calcifications. Electronically Signed   By: Jeannine Boga M.D.   On: 06/18/2016 20:48    Procedures Procedures (including critical care time)  Medications Ordered in ED Medications - No data to display   Initial Impression / Assessment  and Plan / ED Course  I have reviewed the triage vital signs and the nursing notes.  Pertinent labs & imaging results that were available during my care of the patient were reviewed by me and considered in my medical decision making (see chart for details).  Clinical Course    Patient is a 70 year old female who is presenting for persistent back pain that is radiating for the last year and has been waxing and waning in severity but is not going away. She got tired of it today and sided to calm. Recently about her symptoms that is significantly different today. She has had multiple evaluations by her PCP and GI without significant findings except for stable hepatomegaly and new splenomegaly without acute cause. Fatty liver was thought to be due to potential medications. Recently had an endoscopy in the last 6 months which was normal. Also patient has had a CT of her abdomen and pelvis in April and an ultrasound of her abdomen in August with nothing but the above findings. No enlarged lymph nodes during the CAT scan in April. Patient denies any acute weight loss and denies any fevers or respiratory symptoms. Patient's pain may be worse after standing for long periods of time and she does work as a Theme park manager. She tried physical therapy without significant improvement in her back pain and side pain. Urinating does not change her symptoms.   On exam patient has pain in her midthoracic spine radiating around to the sides. It seems to be more musculoskeletal in nature. Low suspicion for PE or lung pathology. Patient has had multiple GI evaluations without significant findings that would be causing the pain. Labs today with normal CBC, CMP, lipase and UA.  Patient is hyperglycemic but seems to be chronic. She states she takes hydrocodone for the pain which does help but then the pain comes back. She has not been on any type of anti-inflammatories in the past or muscle relaxers. We will image the thoracic spine to  evaluate for any compression fracture or impingement.  EKG without acute findings and chest x-ray without acute findings. Thoracic imaging pending  9:07 PM CT consistent  with significant disc protrusion at T8 and 9 which could easily be the cause of patient's symptoms. Will start meloxicam and Flexeril. Will have patient follow-up with her PCP and potential neurosurgery for further treatment.  Final Clinical Impressions(s) / ED Diagnoses   Final diagnoses:  Protrusion of thoracic intervertebral disc    New Prescriptions New Prescriptions   CYCLOBENZAPRINE (FLEXERIL) 10 MG TABLET    Take 1 tablet (10 mg total) by mouth at bedtime.   MELOXICAM (MOBIC) 7.5 MG TABLET    Take 1 tablet (7.5 mg total) by mouth daily.     Blanchie Dessert, MD 06/18/16 [id number]

## 2016-06-18 NOTE — ED Triage Notes (Signed)
Pt states R upper abdominal pain that radiates to R upper back, LUQ pain and central back pain.  Pt takes hydrocodone for chronic back pain and has had the pain x 1 year.  She has seen pcp who "can't figure it out". She is tired of the pain, so she decided to come today.  Denies changes in bowel and bladder habits.  Denies nausea or sob.

## 2016-06-18 NOTE — ED Notes (Signed)
Pt transported to xray 

## 2016-07-06 DIAGNOSIS — Z23 Encounter for immunization: Secondary | ICD-10-CM | POA: Diagnosis not present

## 2016-11-30 DIAGNOSIS — N183 Chronic kidney disease, stage 3 (moderate): Secondary | ICD-10-CM | POA: Diagnosis not present

## 2016-11-30 DIAGNOSIS — I5032 Chronic diastolic (congestive) heart failure: Secondary | ICD-10-CM | POA: Diagnosis not present

## 2016-11-30 DIAGNOSIS — E1165 Type 2 diabetes mellitus with hyperglycemia: Secondary | ICD-10-CM | POA: Diagnosis not present

## 2016-11-30 DIAGNOSIS — E039 Hypothyroidism, unspecified: Secondary | ICD-10-CM | POA: Diagnosis not present

## 2016-11-30 DIAGNOSIS — E782 Mixed hyperlipidemia: Secondary | ICD-10-CM | POA: Diagnosis not present

## 2016-11-30 DIAGNOSIS — I10 Essential (primary) hypertension: Secondary | ICD-10-CM | POA: Diagnosis not present

## 2016-12-08 DIAGNOSIS — F330 Major depressive disorder, recurrent, mild: Secondary | ICD-10-CM | POA: Diagnosis not present

## 2016-12-08 DIAGNOSIS — F411 Generalized anxiety disorder: Secondary | ICD-10-CM | POA: Diagnosis not present

## 2016-12-08 DIAGNOSIS — K219 Gastro-esophageal reflux disease without esophagitis: Secondary | ICD-10-CM | POA: Diagnosis not present

## 2016-12-08 DIAGNOSIS — I5032 Chronic diastolic (congestive) heart failure: Secondary | ICD-10-CM | POA: Diagnosis not present

## 2016-12-08 DIAGNOSIS — E1165 Type 2 diabetes mellitus with hyperglycemia: Secondary | ICD-10-CM | POA: Diagnosis not present

## 2016-12-08 DIAGNOSIS — E782 Mixed hyperlipidemia: Secondary | ICD-10-CM | POA: Diagnosis not present

## 2016-12-08 DIAGNOSIS — E039 Hypothyroidism, unspecified: Secondary | ICD-10-CM | POA: Diagnosis not present

## 2016-12-08 DIAGNOSIS — I10 Essential (primary) hypertension: Secondary | ICD-10-CM | POA: Diagnosis not present

## 2016-12-09 ENCOUNTER — Other Ambulatory Visit (HOSPITAL_COMMUNITY): Payer: Self-pay | Admitting: Family Medicine

## 2016-12-09 DIAGNOSIS — R109 Unspecified abdominal pain: Secondary | ICD-10-CM

## 2016-12-27 ENCOUNTER — Ambulatory Visit (HOSPITAL_COMMUNITY)
Admission: RE | Admit: 2016-12-27 | Discharge: 2016-12-27 | Disposition: A | Discharge status: Home / Self Care | Payer: Medicare Other | Source: Ambulatory Visit | Attending: Family Medicine | Admitting: Family Medicine

## 2016-12-27 DIAGNOSIS — I700 Atherosclerosis of aorta: Secondary | ICD-10-CM | POA: Diagnosis not present

## 2016-12-27 DIAGNOSIS — Z9049 Acquired absence of other specified parts of digestive tract: Secondary | ICD-10-CM | POA: Diagnosis not present

## 2016-12-27 DIAGNOSIS — R109 Unspecified abdominal pain: Secondary | ICD-10-CM | POA: Diagnosis not present

## 2016-12-27 DIAGNOSIS — K760 Fatty (change of) liver, not elsewhere classified: Secondary | ICD-10-CM | POA: Insufficient documentation

## 2016-12-27 DIAGNOSIS — R161 Splenomegaly, not elsewhere classified: Secondary | ICD-10-CM | POA: Insufficient documentation

## 2017-03-09 DIAGNOSIS — Z6825 Body mass index (BMI) 25.0-25.9, adult: Secondary | ICD-10-CM | POA: Diagnosis not present

## 2017-03-09 DIAGNOSIS — N3080 Other cystitis without hematuria: Secondary | ICD-10-CM | POA: Diagnosis not present

## 2017-03-09 DIAGNOSIS — L0390 Cellulitis, unspecified: Secondary | ICD-10-CM | POA: Diagnosis not present

## 2017-03-09 DIAGNOSIS — E039 Hypothyroidism, unspecified: Secondary | ICD-10-CM | POA: Diagnosis not present

## 2017-04-21 DIAGNOSIS — E039 Hypothyroidism, unspecified: Secondary | ICD-10-CM | POA: Diagnosis not present

## 2017-04-21 DIAGNOSIS — J189 Pneumonia, unspecified organism: Secondary | ICD-10-CM | POA: Diagnosis not present

## 2017-04-21 DIAGNOSIS — Z6824 Body mass index (BMI) 24.0-24.9, adult: Secondary | ICD-10-CM | POA: Diagnosis not present

## 2017-04-21 DIAGNOSIS — I10 Essential (primary) hypertension: Secondary | ICD-10-CM | POA: Diagnosis not present

## 2017-04-21 DIAGNOSIS — N183 Chronic kidney disease, stage 3 (moderate): Secondary | ICD-10-CM | POA: Diagnosis not present

## 2017-04-21 DIAGNOSIS — E782 Mixed hyperlipidemia: Secondary | ICD-10-CM | POA: Diagnosis not present

## 2017-04-21 DIAGNOSIS — E1165 Type 2 diabetes mellitus with hyperglycemia: Secondary | ICD-10-CM | POA: Diagnosis not present

## 2017-04-21 DIAGNOSIS — I5032 Chronic diastolic (congestive) heart failure: Secondary | ICD-10-CM | POA: Diagnosis not present

## 2017-05-20 ENCOUNTER — Emergency Department (HOSPITAL_COMMUNITY): Payer: Medicare Other

## 2017-05-20 ENCOUNTER — Observation Stay (HOSPITAL_COMMUNITY)
Admission: EM | Admit: 2017-05-20 | Discharge: 2017-05-22 | Disposition: A | Discharge status: Home / Self Care | Payer: Medicare Other | Attending: Internal Medicine | Admitting: Internal Medicine

## 2017-05-20 ENCOUNTER — Encounter (HOSPITAL_COMMUNITY): Payer: Self-pay | Admitting: Emergency Medicine

## 2017-05-20 DIAGNOSIS — I2510 Atherosclerotic heart disease of native coronary artery without angina pectoris: Secondary | ICD-10-CM | POA: Diagnosis present

## 2017-05-20 DIAGNOSIS — Z7982 Long term (current) use of aspirin: Secondary | ICD-10-CM | POA: Insufficient documentation

## 2017-05-20 DIAGNOSIS — I25810 Atherosclerosis of coronary artery bypass graft(s) without angina pectoris: Secondary | ICD-10-CM | POA: Insufficient documentation

## 2017-05-20 DIAGNOSIS — E860 Dehydration: Secondary | ICD-10-CM | POA: Insufficient documentation

## 2017-05-20 DIAGNOSIS — I10 Essential (primary) hypertension: Secondary | ICD-10-CM | POA: Diagnosis present

## 2017-05-20 DIAGNOSIS — E119 Type 2 diabetes mellitus without complications: Secondary | ICD-10-CM | POA: Diagnosis not present

## 2017-05-20 DIAGNOSIS — K219 Gastro-esophageal reflux disease without esophagitis: Secondary | ICD-10-CM | POA: Diagnosis not present

## 2017-05-20 DIAGNOSIS — Z79899 Other long term (current) drug therapy: Secondary | ICD-10-CM | POA: Diagnosis not present

## 2017-05-20 DIAGNOSIS — A0471 Enterocolitis due to Clostridium difficile, recurrent: Secondary | ICD-10-CM | POA: Diagnosis not present

## 2017-05-20 DIAGNOSIS — E785 Hyperlipidemia, unspecified: Secondary | ICD-10-CM | POA: Diagnosis not present

## 2017-05-20 DIAGNOSIS — F329 Major depressive disorder, single episode, unspecified: Secondary | ICD-10-CM | POA: Diagnosis present

## 2017-05-20 DIAGNOSIS — E876 Hypokalemia: Secondary | ICD-10-CM | POA: Diagnosis not present

## 2017-05-20 DIAGNOSIS — R112 Nausea with vomiting, unspecified: Secondary | ICD-10-CM | POA: Diagnosis not present

## 2017-05-20 DIAGNOSIS — E1165 Type 2 diabetes mellitus with hyperglycemia: Secondary | ICD-10-CM

## 2017-05-20 DIAGNOSIS — A0472 Enterocolitis due to Clostridium difficile, not specified as recurrent: Secondary | ICD-10-CM | POA: Diagnosis not present

## 2017-05-20 DIAGNOSIS — Z7984 Long term (current) use of oral hypoglycemic drugs: Secondary | ICD-10-CM | POA: Diagnosis not present

## 2017-05-20 DIAGNOSIS — F32A Depression, unspecified: Secondary | ICD-10-CM | POA: Diagnosis present

## 2017-05-20 DIAGNOSIS — R109 Unspecified abdominal pain: Secondary | ICD-10-CM | POA: Diagnosis not present

## 2017-05-20 LAB — URINALYSIS, ROUTINE W REFLEX MICROSCOPIC
BILIRUBIN URINE: NEGATIVE
Glucose, UA: NEGATIVE mg/dL
Hgb urine dipstick: NEGATIVE
KETONES UR: NEGATIVE mg/dL
LEUKOCYTES UA: NEGATIVE
NITRITE: NEGATIVE
PH: 5 (ref 5.0–8.0)
Protein, ur: NEGATIVE mg/dL
SPECIFIC GRAVITY, URINE: 1.001 — AB (ref 1.005–1.030)

## 2017-05-20 LAB — COMPREHENSIVE METABOLIC PANEL
ALT: 29 U/L (ref 14–54)
AST: 14 U/L — ABNORMAL LOW (ref 15–41)
Albumin: 3.7 g/dL (ref 3.5–5.0)
Alkaline Phosphatase: 108 U/L (ref 38–126)
Anion gap: 8 (ref 5–15)
BUN: 9 mg/dL (ref 6–20)
CALCIUM: 9.1 mg/dL (ref 8.9–10.3)
CO2: 27 mmol/L (ref 22–32)
Chloride: 99 mmol/L — ABNORMAL LOW (ref 101–111)
Creatinine, Ser: 0.83 mg/dL (ref 0.44–1.00)
GFR calc Af Amer: >60 mL/min (ref >60)
GFR calc non Af Amer: >60 mL/min (ref >60)
Glucose, Bld: 220 mg/dL — ABNORMAL HIGH (ref 65–99)
Potassium: 2.8 mmol/L — ABNORMAL LOW (ref 3.5–5.1)
SODIUM: 134 mmol/L — AB (ref 135–145)
TOTAL PROTEIN: 7 g/dL (ref 6.5–8.1)
Total Bilirubin: 1.6 mg/dL — ABNORMAL HIGH (ref 0.3–1.2)

## 2017-05-20 LAB — CBC
HCT: 36.1 % (ref 36.0–46.0)
HEMOGLOBIN: 12.8 g/dL (ref 12.0–15.0)
MCH: 31.8 pg (ref 26.0–34.0)
MCHC: 35.5 g/dL (ref 30.0–36.0)
MCV: 89.8 fL (ref 78.0–100.0)
Platelets: 185 10*3/uL (ref 150–400)
RBC: 4.02 MIL/uL (ref 3.87–5.11)
RDW: 13.1 % (ref 11.5–15.5)
WBC: 12.2 10*3/uL — AB (ref 4.0–10.5)

## 2017-05-20 LAB — LACTIC ACID, PLASMA: Lactic Acid, Venous: 1.4 mmol/L (ref 0.5–1.9)

## 2017-05-20 LAB — C DIFFICILE QUICK SCREEN W PCR REFLEX
C DIFFICILE (CDIFF) INTERP: DETECTED
C DIFFICILE (CDIFF) TOXIN: POSITIVE — AB
C DIFFICLE (CDIFF) ANTIGEN: POSITIVE — AB

## 2017-05-20 LAB — GLUCOSE, CAPILLARY: Glucose-Capillary: 188 mg/dL — ABNORMAL HIGH (ref 65–99)

## 2017-05-20 LAB — BRAIN NATRIURETIC PEPTIDE: B NATRIURETIC PEPTIDE 5: 100 pg/mL (ref 0.0–100.0)

## 2017-05-20 LAB — LIPASE, BLOOD: LIPASE: 29 U/L (ref 11–51)

## 2017-05-20 LAB — MAGNESIUM: MAGNESIUM: 1.9 mg/dL (ref 1.7–2.4)

## 2017-05-20 LAB — CBG MONITORING, ED: GLUCOSE-CAPILLARY: 241 mg/dL — AB (ref 65–99)

## 2017-05-20 MED ORDER — POTASSIUM CHLORIDE CRYS ER 20 MEQ PO TBCR
40.0000 meq | EXTENDED_RELEASE_TABLET | Freq: Once | ORAL | Status: AC
  Administered 2017-05-20: 40 meq via ORAL
  Filled 2017-05-20: qty 2

## 2017-05-20 MED ORDER — POTASSIUM CHLORIDE 10 MEQ/100ML IV SOLN
10.0000 meq | Freq: Once | INTRAVENOUS | Status: AC
  Administered 2017-05-20: 10 meq via INTRAVENOUS
  Filled 2017-05-20: qty 100

## 2017-05-20 MED ORDER — GEMFIBROZIL 600 MG PO TABS
600.0000 mg | ORAL_TABLET | Freq: Two times a day (BID) | ORAL | Status: DC
  Administered 2017-05-20 – 2017-05-22 (×4): 600 mg via ORAL
  Filled 2017-05-20 (×10): qty 1

## 2017-05-20 MED ORDER — CLONIDINE HCL 0.2 MG PO TABS
0.2000 mg | ORAL_TABLET | Freq: Every day | ORAL | Status: DC
  Administered 2017-05-20 – 2017-05-21 (×2): 0.2 mg via ORAL
  Filled 2017-05-20 (×2): qty 1

## 2017-05-20 MED ORDER — BACITRACIN-NEOMYCIN-POLYMYXIN 400-5-5000 EX OINT: TOPICAL_OINTMENT | Freq: Two times a day (BID) | CUTANEOUS | Status: DC | PRN

## 2017-05-20 MED ORDER — MORPHINE SULFATE (PF) 2 MG/ML IV SOLN: 1.0000 mg | INTRAVENOUS | Status: DC | PRN

## 2017-05-20 MED ORDER — ATENOLOL 25 MG PO TABS
75.0000 mg | ORAL_TABLET | Freq: Every day | ORAL | Status: DC
  Administered 2017-05-20 – 2017-05-22 (×3): 75 mg via ORAL
  Filled 2017-05-20 (×3): qty 3

## 2017-05-20 MED ORDER — VANCOMYCIN 50 MG/ML ORAL SOLUTION
125.0000 mg | Freq: Four times a day (QID) | ORAL | Status: DC
  Administered 2017-05-20 – 2017-05-22 (×7): 125 mg via ORAL
  Filled 2017-05-20 (×13): qty 2.5

## 2017-05-20 MED ORDER — PROBIOTIC-10 PO CAPS: 10.0000 mg | ORAL_CAPSULE | Freq: Every day | ORAL | Status: DC

## 2017-05-20 MED ORDER — AMLODIPINE BESYLATE 5 MG PO TABS
5.0000 mg | ORAL_TABLET | Freq: Every day | ORAL | Status: DC
  Administered 2017-05-21 – 2017-05-22 (×2): 5 mg via ORAL
  Filled 2017-05-20 (×2): qty 1

## 2017-05-20 MED ORDER — INSULIN ASPART 100 UNIT/ML ~~LOC~~ SOLN
0.0000 [IU] | Freq: Every day | SUBCUTANEOUS | Status: DC
Start: 2017-05-20 — End: 2017-05-22

## 2017-05-20 MED ORDER — HYDRALAZINE HCL 20 MG/ML IJ SOLN: 5.0000 mg | INTRAMUSCULAR | Status: DC | PRN

## 2017-05-20 MED ORDER — METRONIDAZOLE 500 MG PO TABS: 500.0000 mg | ORAL_TABLET | Freq: Once | ORAL | Status: DC

## 2017-05-20 MED ORDER — LOSARTAN POTASSIUM 50 MG PO TABS
100.0000 mg | ORAL_TABLET | Freq: Every day | ORAL | Status: DC
  Filled 2017-05-20 (×4): qty 2

## 2017-05-20 MED ORDER — MAGNESIUM SULFATE IN D5W 1-5 GM/100ML-% IV SOLN
1.0000 g | Freq: Once | INTRAVENOUS | Status: AC
  Administered 2017-05-20: 1 g via INTRAVENOUS
  Filled 2017-05-20: qty 100

## 2017-05-20 MED ORDER — FAMOTIDINE 20 MG PO TABS
20.0000 mg | ORAL_TABLET | Freq: Two times a day (BID) | ORAL | Status: DC
  Administered 2017-05-20 – 2017-05-22 (×4): 20 mg via ORAL
  Filled 2017-05-20 (×4): qty 1

## 2017-05-20 MED ORDER — ACETAMINOPHEN 325 MG PO TABS: 650.0000 mg | ORAL_TABLET | Freq: Four times a day (QID) | ORAL | Status: DC | PRN

## 2017-05-20 MED ORDER — HYDROCODONE-ACETAMINOPHEN 5-325 MG PO TABS: 1.0000 | ORAL_TABLET | Freq: Four times a day (QID) | ORAL | Status: DC | PRN

## 2017-05-20 MED ORDER — SODIUM CHLORIDE 0.9 % IV BOLUS (SEPSIS)
1000.0000 mL | Freq: Once | INTRAVENOUS | Status: AC
  Administered 2017-05-20: 1000 mL via INTRAVENOUS

## 2017-05-20 MED ORDER — ENOXAPARIN SODIUM 40 MG/0.4ML ~~LOC~~ SOLN
40.0000 mg | SUBCUTANEOUS | Status: DC
  Administered 2017-05-21: 40 mg via SUBCUTANEOUS
  Filled 2017-05-20: qty 0.4

## 2017-05-20 MED ORDER — ZOLPIDEM TARTRATE 5 MG PO TABS
5.0000 mg | ORAL_TABLET | Freq: Every day | ORAL | Status: DC
  Administered 2017-05-20 – 2017-05-21 (×2): 5 mg via ORAL
  Filled 2017-05-20 (×2): qty 1

## 2017-05-20 MED ORDER — NITROGLYCERIN 0.4 MG SL SUBL: 0.4000 mg | SUBLINGUAL_TABLET | SUBLINGUAL | Status: DC | PRN

## 2017-05-20 MED ORDER — ONDANSETRON HCL 4 MG/2ML IJ SOLN
4.0000 mg | Freq: Three times a day (TID) | INTRAMUSCULAR | Status: DC
  Administered 2017-05-20 – 2017-05-22 (×6): 4 mg via INTRAVENOUS
  Filled 2017-05-20 (×7): qty 2

## 2017-05-20 MED ORDER — CIPROFLOXACIN IN D5W 400 MG/200ML IV SOLN: 400.0000 mg | Freq: Once | INTRAVENOUS | Status: DC

## 2017-05-20 MED ORDER — INSULIN ASPART 100 UNIT/ML ~~LOC~~ SOLN
0.0000 [IU] | Freq: Three times a day (TID) | SUBCUTANEOUS | Status: DC
  Administered 2017-05-20: 3 [IU] via SUBCUTANEOUS
  Administered 2017-05-21 (×3): 2 [IU] via SUBCUTANEOUS
  Administered 2017-05-22: 3 [IU] via SUBCUTANEOUS
  Administered 2017-05-22: 2 [IU] via SUBCUTANEOUS
  Filled 2017-05-20: qty 1

## 2017-05-20 MED ORDER — IOPAMIDOL (ISOVUE-300) INJECTION 61%
100.0000 mL | Freq: Once | INTRAVENOUS | Status: AC | PRN
  Administered 2017-05-20: 100 mL via INTRAVENOUS

## 2017-05-20 MED ORDER — PAROXETINE HCL 20 MG PO TABS
40.0000 mg | ORAL_TABLET | Freq: Every morning | ORAL | Status: DC
  Administered 2017-05-21 – 2017-05-22 (×2): 40 mg via ORAL
  Filled 2017-05-20 (×5): qty 2

## 2017-05-20 MED ORDER — BACITRACIN-NEOMYCIN-POLYMYXIN OINTMENT TUBE
1.0000 "application " | TOPICAL_OINTMENT | Freq: Two times a day (BID) | CUTANEOUS | Status: DC | PRN
  Filled 2017-05-20: qty 14.17

## 2017-05-20 MED ORDER — SODIUM CHLORIDE 0.9 % IV SOLN
INTRAVENOUS | Status: DC
  Administered 2017-05-20: 20:00:00 via INTRAVENOUS

## 2017-05-20 NOTE — ED Notes (Signed)
Hospitalist has ordered that patient be NPO.

## 2017-05-20 NOTE — ED Triage Notes (Signed)
abd pain with n/d and poor appetite x 4 days.

## 2017-05-20 NOTE — H&P (Addendum)
History and Physical    [name] JYN:[id number] DOB: Dec 21, 1945 DOA: 05/20/2017  Referring MD/NP/PA:   PCP: Curlene Labrum, MD   Patient coming from:  The patient is coming from home.  At baseline, pt is independent for most of ADL.   Chief Complaint: Nausea, vomiting, diarrhea, abdominal pain  HPI: [name] is a 71 y.o. female with medical history significant of hypertension, hyperlipidemia, diabetes mellitus, GERD, depression, CAD, s/p of CABG, s/p of stent placement, who presents with nausea, vomiting, diarrhea and abdominal pain.  Patient states that she has been having nausea, vomiting, diarrhea and abdominal pain in the past 4 days. She vomited several times daily before yesterday, but not today. She has had 4-5 times of watery diarrhea today. She also has abdominal pain. Her abdominal pain is diffuse, constant, 7 out of 10 in severity, nonradiating. She had subjective fever earlier, but not now. Currently patient has chills, but no fever. Patient denies chest pain, shortness of breath, cough, symptoms of UTI or unilateral weeks. Patient states that approximately 2 months ago she had vaginal yeast infection and took antibiotics for that. Approximately 3 weeks ago she had flu like symptoms, and received antibiotics. Patient could not tell exactly what antibiotics she took recently.  ED Course: pt was found to have positive stool test for C diff antigen and toxin , WBC 12.2, lipase 29, negative urinalysis, potassium 2.8, creatinine 0.83, temperature normal, no tachycardia, no tachypnea, oxygen saturation 100% on room air. Patient is placed on telemetry bed for observation.  # CT abdomen/pelvis showed 1. Pancolitis, often infectious with this appearance. 2. Hepatic steatosis. 3. Borderline liver surface lobulation, please correlate for cirrhosis risk factors. 4.  Aortic Atherosclerosis (ICD10-I70.0).  Review of Systems:   General: has subjective fevers, chills, no body  weight gain, has poor appetite, has fatigue HEENT: no blurry vision, hearing changes or sore throat Respiratory: no dyspnea, coughing, wheezing CV: no chest pain, no palpitations GI: has nausea, vomiting, abdominal pain, diarrhea, no constipation GU: no dysuria, burning on urination, increased urinary frequency, hematuria  Ext: no leg edema Neuro: no unilateral weakness, numbness, or tingling, no vision change or hearing loss Skin: no rash, no skin tear. MSK: No muscle spasm, no deformity, no limitation of range of movement in spin Heme: No easy bruising.  Travel history: No recent long distant travel.  Allergy:  Allergies  Allergen Reactions  . Aspirin Nausea Only    Past Medical History:  Diagnosis Date  . Acute myocardial infarction, unspecified site, episode of care unspecified   . Coronary artery disease status post CABG 2005    a. s/p CABG 2005. b. 2011 - DES of SVG to PDA. c. Admitted with CP 03/2012 with stable anatomy by cath, felt noncardiac pain.  . Depression   . Diabetes mellitus   . Elevated LFTs   . Elevated transaminase level    Most recently October 2012 most recent  . Esophageal reflux   . GERD (gastroesophageal reflux disease)   . Hernia   . History of cholecystectomy   . Hyperlipidemia    a. Not on statin due to elevated liver enzymes.  . Hypertension   . Kidney cysts    left    Past Surgical History:  Procedure Laterality Date  . ABDOMINAL HYSTERECTOMY    . BYPASS GRAFT  08/2003   Quadruple   . CHOLECYSTECTOMY    . HERNIA REPAIR Right 01/2011  . LEFT HEART CATHETERIZATION WITH CORONARY/GRAFT ANGIOGRAM N/A 04/05/2012  Procedure: LEFT HEART CATHETERIZATION WITH Beatrix Fetters;  Surgeon: Sherren Mocha, MD;  Location: [facility] CATH LAB;  Service: Cardiovascular;  Laterality: N/A;    Social History:  reports that she has never smoked. She has never used smokeless tobacco. She reports that she does not drink alcohol or use drugs.  Family History:   Family History  Problem Relation Age of Onset  . Cancer Mother        stomach  . Diabetes Mother   . Heart attack Brother   . Addison's disease Sister   . Lung cancer Daughter        smoker  . Colon cancer Neg Hx      Prior to Admission medications   Medication Sig Start Date End Date Taking? Authorizing Provider  amLODipine (NORVASC) 5 MG tablet Take 1 tablet (5 mg total) by mouth daily. 08/19/14  Yes Lelon Perla, MD  atenolol (TENORMIN) 50 MG tablet Take 75 mg by mouth daily.    Yes [provider]  cloNIDine (CATAPRES) 0.2 MG tablet Take 1 tablet (0.2 mg total) by mouth at bedtime. 10/04/12  Yes Lelon Perla, MD  famotidine (PEPCID) 20 MG tablet Take 1 tablet (20 mg total) by mouth 2 (two) times daily. 12/05/15  Yes Sherwood Gambler, MD  furosemide (LASIX) 40 MG tablet Take 40 mg by mouth daily.     Yes [provider]  gemfibrozil (LOPID) 600 MG tablet Take 600 mg by mouth 2 (two) times daily. 07/23/14  Yes [provider]  glipiZIDE (GLUCOTROL) 10 MG tablet Take 10 mg by mouth 2 (two) times daily. 07/01/14  Yes [provider]  HYDROcodone-acetaminophen (NORCO/VICODIN) 5-325 MG per tablet Take 1 tablet by mouth every 6 (six) hours as needed for moderate pain.   Yes [provider]  losartan (COZAAR) 100 MG tablet Take 100 mg by mouth daily. 06/24/14  Yes [provider]  neomycin-bacitracin-polymyxin (NEOSPORIN) [phone] ointment Apply 1 application topically 2 (two) times daily as needed (irritation).   Yes [provider]  nitroGLYCERIN (NITROSTAT) 0.4 MG SL tablet Place 1 tablet (0.4 mg total) under the tongue every 5 (five) minutes as needed (up to 3 doses). For chest pain 04/05/12  Yes Dunn, Dayna N, PA-C  PARoxetine (PAXIL) 40 MG tablet Take 40 mg by mouth every morning. 07/01/14  Yes [provider]  Probiotic Product (PROBIOTIC-10) CAPS Take 10 mg by mouth daily.   Yes [provider]    zolpidem (AMBIEN) 10 MG tablet Take 5-10 mg by mouth at bedtime.  07/11/14  Yes [provider]  aspirin EC 81 MG tablet Take 1 tablet (81 mg total) by mouth daily. Patient not taking: Reported on 05/20/2017 04/28/16   Lelon Perla, MD  cyclobenzaprine (FLEXERIL) 10 MG tablet Take 1 tablet (10 mg total) by mouth at bedtime. Patient not taking: Reported on 05/20/2017 06/18/16   Blanchie Dessert, MD  meloxicam (MOBIC) 7.5 MG tablet Take 1 tablet (7.5 mg total) by mouth daily. Patient not taking: Reported on 05/20/2017 06/18/16   Blanchie Dessert, MD  omeprazole (PRILOSEC) 40 MG capsule Take 1 capsule (40 mg total) by mouth daily. Patient not taking: Reported on 05/20/2017 12/05/15   Sherwood Gambler, MD    Physical Exam: Vitals:   05/20/17 1201 05/20/17 1202 05/20/17 1430  BP: 103/62  (!) 104/57  Pulse: 65  64  Resp: 18    Temp: 98.6 F (37 C)    SpO2: 99%  100%  Weight:  65.3 kg (144 lb)   Height:  5\' 3"  (1.6 m)    General: Not in acute distress HEENT:       Eyes: PERRL, EOMI, no scleral icterus.       ENT: No discharge from the ears and nose, no pharynx injection, no tonsillar enlargement.        Neck: No JVD, no bruit, no mass felt. Heme: No neck lymph node enlargement. Cardiac: S1/S2, RRR, No murmurs, No gallops or rubs. Respiratory: No rales, wheezing, rhonchi or rubs. GI: Soft, nondistended, has diffused tenderness, no rebound pain, no organomegaly, BS present. GU: No hematuria Ext: No pitting leg edema bilaterally. 2+DP/PT pulse bilaterally. Musculoskeletal: No joint deformities, No joint redness or warmth, no limitation of ROM in spin. Skin: No rashes.  Neuro: Alert, oriented X3, cranial nerves II-XII grossly intact, moves all extremities normally. Psych: Patient is not psychotic, no suicidal or hemocidal ideation.  Labs on Admission: I have personally reviewed following labs and imaging studies  CBC:  Recent Labs Lab 05/20/17 1237  WBC 12.2*  HGB  12.8  HCT 36.1  MCV 89.8  PLT 124   Basic Metabolic Panel:  Recent Labs Lab 05/20/17 1237  NA 134*  K 2.8*  CL 99*  CO2 27  GLUCOSE 220*  BUN 9  CREATININE 0.83  CALCIUM 9.1   GFR: Estimated Creatinine Clearance: 56.5 mL/min (by C-G formula based on SCr of 0.83 mg/dL). Liver Function Tests:  Recent Labs Lab 05/20/17 1237  AST 14*  ALT 29  ALKPHOS 108  BILITOT 1.6*  PROT 7.0  ALBUMIN 3.7    Recent Labs Lab 05/20/17 1237  LIPASE 29   No results for input(s): AMMONIA in the last 168 hours. Coagulation Profile: No results for input(s): INR, PROTIME in the last 168 hours. Cardiac Enzymes: No results for input(s): CKTOTAL, CKMB, CKMBINDEX, TROPONINI in the last 168 hours. BNP (last 3 results) No results for input(s): PROBNP in the last 8760 hours. HbA1C: No results for input(s): HGBA1C in the last 72 hours. CBG: No results for input(s): GLUCAP in the last 168 hours. Lipid Profile: No results for input(s): CHOL, HDL, LDLCALC, TRIG, CHOLHDL, LDLDIRECT in the last 72 hours. Thyroid Function Tests: No results for input(s): TSH, T4TOTAL, FREET4, T3FREE, THYROIDAB in the last 72 hours. Anemia Panel: No results for input(s): VITAMINB12, FOLATE, FERRITIN, TIBC, IRON, RETICCTPCT in the last 72 hours. Urine analysis:    Component Value Date/Time   COLORURINE STRAW (A) 05/20/2017 1205   APPEARANCEUR CLEAR 05/20/2017 1205   LABSPEC 1.001 (L) 05/20/2017 1205   PHURINE 5.0 05/20/2017 1205   GLUCOSEU NEGATIVE 05/20/2017 1205   HGBUR NEGATIVE 05/20/2017 1205   [place] 05/20/2017 1205   KETONESUR NEGATIVE 05/20/2017 1205   PROTEINUR NEGATIVE 05/20/2017 1205   UROBILINOGEN 0.2 05/19/2010 2155   NITRITE NEGATIVE 05/20/2017 1205   LEUKOCYTESUR NEGATIVE 05/20/2017 1205   Sepsis Labs: @LABRCNTIP (procalcitonin:4,lacticidven:4) ) Recent Results (from the past 240 hour(s))  C difficile quick scan w PCR reflex     Status: Abnormal   Collection Time: 05/20/17   1:27 PM  Result Value Ref Range Status   C Diff antigen POSITIVE (A) NEGATIVE Final   C Diff toxin POSITIVE (A) NEGATIVE Final   C Diff interpretation Toxin producing C. difficile detected.  Final    Comment: CRITICAL RESULT CALLED TO, READ BACK BY AND VERIFIED WITH: PATRAW,B ON 05/20/17 AT 1600 BY LOY,C      Radiological Exams on Admission: Ct Abdomen Pelvis W Contrast  Result Date: 05/20/2017 CLINICAL DATA:  Unspecified abdominal pain. Nausea and diarrhea with poor appetite for 4 days. EXAM: CT ABDOMEN AND PELVIS WITH CONTRAST TECHNIQUE: Multidetector CT imaging of the abdomen and pelvis was performed using the standard protocol following bolus administration of intravenous contrast. CONTRAST:  149mL ISOVUE-300 IOPAMIDOL (ISOVUE-300) INJECTION 61% COMPARISON:  12/05/2015 FINDINGS: Lower chest:  No acute finding.  Partly seen postoperative changes. Hepatobiliary: No focal hepatic finding. Mild steatosis that was also seen previously. Borderline liver surface lobulation with widening of fissures.Cholecystectomy clips. Normal common bile duct diameter. Pancreas: Unremarkable. Spleen: Unremarkable. Adrenals/Urinary Tract: Negative adrenals. No hydronephrosis or stone. Small and few renal cystic densities. Unremarkable bladder. Stomach/Bowel: The colon is diffusely thick walled with hypervascularity and occasional pericolonic fat edema. The mucosa is hyperenhancing, especially notable where distended at the level of the sigmoid and rectum. No perforation or abscess. No appendicitis. Vascular/Lymphatic: No acute vascular abnormality. Moderate atherosclerotic calcification of the aorta and iliacs. No mass or adenopathy. Reproductive:Hysterectomy.  Negative adnexae. Other: No ascites or pneumoperitoneum. Musculoskeletal: No acute abnormalities. IMPRESSION: 1. Pancolitis, often infectious with this appearance. 2. Hepatic steatosis. 3. Borderline liver surface lobulation, please correlate for cirrhosis risk  factors. 4.  Aortic Atherosclerosis (ICD10-I70.0). Electronically Signed   By: Monte Fantasia M.D.   On: 05/20/2017 15:25     EKG:  Not done in ED, will get one.   Assessment/Plan Principal Problem:   C. difficile colitis Active Problems:   Depression   Essential hypertension   Coronary atherosclerosis   GERD   HLD (hyperlipidemia)   Diabetes mellitus without complication (HCC)   Hypokalemia   C. difficile colitis: pt has positive stool test for C diff antigen and toxin, and leukocytosis with WBC 12.2. CT scan showed pancolitis, consistent with C. difficile colitis. Clinically patient is not septic. No fever, tachycardia or tachypnea. Lactic acid is pending. Currently hemodynamically stable.  -will place on tele bed for obs -oral vancomycin per pharm - npo  - prn morphine for pain  - prn Zofran for nausea  - Blood culture x 2 - will get lactic acid level  - IVF: 1 L of NS bolus in ED, followed by 75 cc/h - may consult to GI if getting worse  Depression: Stable, no suicidal or homicidal ideations. -Continue home medications: Paxil  Essential hypertension: -hold lasix since pt is on NPO and need IVF -continue amlodipine, atenolol, Cozaar, clonidine -IV hydralazine when necessary-  CAD: pt is not taking ASA -continue atenolol, Lopid -Nitroglycerin when necessary  GERD: -hold Protonix -Pepcid  HLD: -Lopid  Hypokalemia: K= 2.8  on admission. - Repleted K - will give magnesium sulfate 1 g 1 by IV - Check Mg level  DM-II: Last A1c 6.4 on 10/15/12, well controled. Patient is taking gliplizd at home -SSI -check A1c in am   DVT ppx: SQ Lovenox Code Status: Full code Family Communication:   Yes, patient's  Husband at bed side Disposition Plan:  Anticipate discharge back to previous home environment Consults called:  none Admission status: Obs / tele    Date of Service 05/20/2017    Ivor Costa [facility] Pager [phone]  If 7PM-7AM, please contact  night-coverage [email or web address] Password [facility] 05/20/2017, 4:37 PM

## 2017-05-20 NOTE — ED Provider Notes (Signed)
[facility] DEPT Provider Note   CSN: [id number] Arrival date & time: 05/20/17  1141     History   Chief Complaint Chief Complaint  Patient presents with  . Abdominal Pain    HPI [name] is a 71 y.o. female.  HPI  Well as a known history of multiple medical problems including coronary disease status post bypass grafting, diabetes, chronic elevation in her transaminases, acid reflux him a hyperlipidemia and cysts on her kidneys. She presents to the hospital today with a complaint of diarrhea 4 days, multiple episodes, started out as watery stools and now has transitioned into becoming darker (using Pepto frequently), has also become mucoid overnight and has had 8 mucous-like stools with no blood. She has diffuse abdominal pain but no fevers, no vomiting. She was diagnosed with some type of infection a couple of months ago and was placed on an antibiotic because her body hurt, she does not know what the infection was. She then was seen and treated for influenza after stating she tested positive one month ago. Since that time she has done well until 4 days ago when she developed recurrent diffuse abdominal pain with diffuse watery diarrhea. She reports that this pain does radiate to her back, there is no swelling of the legs, no chest pain coughing or shortness of breath, no headache or blurred vision. She has been able to eat and drink  Past Medical History:  Diagnosis Date  . Acute myocardial infarction, unspecified site, episode of care unspecified   . Coronary artery disease status post CABG 2005    a. s/p CABG 2005. b. 2011 - DES of SVG to PDA. c. Admitted with CP 03/2012 with stable anatomy by cath, felt noncardiac pain.  . Depression   . Diabetes mellitus   . Elevated LFTs   . Elevated transaminase level    Most recently October 2012 most recent  . Esophageal reflux   . GERD (gastroesophageal reflux disease)   . Hernia   . History of cholecystectomy   .  Hyperlipidemia    a. Not on statin due to elevated liver enzymes.  . Hypertension   . Kidney cysts    left    Patient Active Problem List   Diagnosis Date Noted  . Nausea alone 09/20/2012  . Nonspecific abnormal results of liver function study 09/20/2012  . Elevated transaminase level   . Dyspepsia and other specified disorders of function of stomach 05/18/2011  . FATTY LIVER DISEASE 01/09/2010  . Hyperlipidemia 01/25/2009  . DEPRESSION 01/25/2009  . Essential hypertension 01/25/2009  . CAD 01/25/2009  . BACK PAIN 01/25/2009  . HEADACHE 01/25/2009  . Chest pain 01/25/2009  . GERD 09/03/2008  . DYSPHAGIA 09/03/2008  . DIABETES MELLITUS, BORDERLINE 09/03/2008    Past Surgical History:  Procedure Laterality Date  . ABDOMINAL HYSTERECTOMY    . BYPASS GRAFT  08/2003   Quadruple   . CHOLECYSTECTOMY    . HERNIA REPAIR Right 01/2011  . LEFT HEART CATHETERIZATION WITH CORONARY/GRAFT ANGIOGRAM N/A 04/05/2012   Procedure: LEFT HEART CATHETERIZATION WITH Beatrix Fetters;  Surgeon: Sherren Mocha, MD;  Location: [facility] CATH LAB;  Service: Cardiovascular;  Laterality: N/A;    OB History    No data available       Home Medications    Prior to Admission medications   Medication Sig Start Date End Date Taking? Authorizing Provider  amLODipine (NORVASC) 5 MG tablet Take 1 tablet (5 mg total) by mouth daily. 08/19/14  Yes Crenshaw,  Denice Bors, MD  atenolol (TENORMIN) 50 MG tablet Take 75 mg by mouth daily.    Yes [provider]  cloNIDine (CATAPRES) 0.2 MG tablet Take 1 tablet (0.2 mg total) by mouth at bedtime. 10/04/12  Yes Lelon Perla, MD  famotidine (PEPCID) 20 MG tablet Take 1 tablet (20 mg total) by mouth 2 (two) times daily. 12/05/15  Yes Sherwood Gambler, MD  furosemide (LASIX) 40 MG tablet Take 40 mg by mouth daily.     Yes [provider]  gemfibrozil (LOPID) 600 MG tablet Take 600 mg by mouth 2 (two) times daily. 07/23/14  Yes [provider]    glipiZIDE (GLUCOTROL) 10 MG tablet Take 10 mg by mouth 2 (two) times daily. 07/01/14  Yes [provider]  HYDROcodone-acetaminophen (NORCO/VICODIN) 5-325 MG per tablet Take 1 tablet by mouth every 6 (six) hours as needed for moderate pain.   Yes [provider]  losartan (COZAAR) 100 MG tablet Take 100 mg by mouth daily. 06/24/14  Yes [provider]  neomycin-bacitracin-polymyxin (NEOSPORIN) [id number] ointment Apply 1 application topically 2 (two) times daily as needed (irritation).   Yes [provider]  nitroGLYCERIN (NITROSTAT) 0.4 MG SL tablet Place 1 tablet (0.4 mg total) under the tongue every 5 (five) minutes as needed (up to 3 doses). For chest pain 04/05/12  Yes Dunn, Dayna N, PA-C  PARoxetine (PAXIL) 40 MG tablet Take 40 mg by mouth every morning. 07/01/14  Yes [provider]  Probiotic Product (PROBIOTIC-10) CAPS Take 10 mg by mouth daily.   Yes [provider]  zolpidem (AMBIEN) 10 MG tablet Take 5-10 mg by mouth at bedtime.  07/11/14  Yes [provider]  aspirin EC 81 MG tablet Take 1 tablet (81 mg total) by mouth daily. Patient not taking: Reported on 05/20/2017 04/28/16   Lelon Perla, MD  cyclobenzaprine (FLEXERIL) 10 MG tablet Take 1 tablet (10 mg total) by mouth at bedtime. Patient not taking: Reported on 05/20/2017 06/18/16   Blanchie Dessert, MD  meloxicam (MOBIC) 7.5 MG tablet Take 1 tablet (7.5 mg total) by mouth daily. Patient not taking: Reported on 05/20/2017 06/18/16   Blanchie Dessert, MD  omeprazole (PRILOSEC) 40 MG capsule Take 1 capsule (40 mg total) by mouth daily. Patient not taking: Reported on 05/20/2017 12/05/15   Sherwood Gambler, MD    Family History Family History  Problem Relation Age of Onset  . Cancer Mother        stomach  . Diabetes Mother   . Heart attack Brother   . Addison's disease Sister   . Lung cancer Daughter        smoker  . Colon cancer Neg Hx     Social  History Social History  Substance Use Topics  . Smoking status: Never Smoker  . Smokeless tobacco: Never Used  . Alcohol use No     Allergies   Aspirin   Review of Systems Review of Systems  All other systems reviewed and are negative.    Physical Exam Updated Vital Signs BP (!) 104/57   Pulse 64   Temp 98.6 F (37 C)   Resp 18   Ht 5\' 3"  (1.6 m)   Wt 65.3 kg (144 lb)   SpO2 100%   BMI 25.51 kg/m   Physical Exam  Constitutional: She appears well-developed and well-nourished. No distress.  HENT:  Head: Normocephalic and atraumatic.  Mouth/Throat: Oropharynx is clear and moist. No oropharyngeal exudate.  Eyes: Pupils are  equal, round, and reactive to light. Conjunctivae and EOM are normal. Right eye exhibits no discharge. Left eye exhibits no discharge. No scleral icterus.  Neck: Normal range of motion. Neck supple. No JVD present. No thyromegaly present.  Cardiovascular: Normal rate, regular rhythm, normal heart sounds and intact distal pulses.  Exam reveals no gallop and no friction rub.   No murmur heard. Pulmonary/Chest: Effort normal and breath sounds normal. No respiratory distress. She has no wheezes. She has no rales.  Abdominal: Soft. She exhibits no distension and no mass. There is tenderness ( Diffuse moderate abdominal tenderness to palpation, no guarding or peritoneal signs ).  Bowel sounds are normal  Musculoskeletal: Normal range of motion. She exhibits no edema or tenderness.  Lymphadenopathy:    She has no cervical adenopathy.  Neurological: She is alert. Coordination normal.  Skin: Skin is warm and dry. No rash noted. No erythema.  Psychiatric: She has a normal mood and affect. Her behavior is normal.  Nursing note and vitals reviewed.   ED Treatments / Results  Labs (all labs ordered are listed, but only abnormal results are displayed) Labs Reviewed  C DIFFICILE QUICK SCREEN W PCR REFLEX - Abnormal; Notable for the following:       Result  Value   C Diff antigen POSITIVE (*)    C Diff toxin POSITIVE (*)    All other components within normal limits  COMPREHENSIVE METABOLIC PANEL - Abnormal; Notable for the following:    Sodium 134 (*)    Potassium 2.8 (*)    Chloride 99 (*)    Glucose, Bld 220 (*)    AST 14 (*)    Total Bilirubin 1.6 (*)    All other components within normal limits  CBC - Abnormal; Notable for the following:    WBC 12.2 (*)    All other components within normal limits  URINALYSIS, ROUTINE W REFLEX MICROSCOPIC - Abnormal; Notable for the following:    Color, Urine STRAW (*)    Specific Gravity, Urine 1.001 (*)    All other components within normal limits  LIPASE, BLOOD    EKG  EKG Interpretation None       Radiology Ct Abdomen Pelvis W Contrast  Result Date: 05/20/2017 CLINICAL DATA:  Unspecified abdominal pain. Nausea and diarrhea with poor appetite for 4 days. EXAM: CT ABDOMEN AND PELVIS WITH CONTRAST TECHNIQUE: Multidetector CT imaging of the abdomen and pelvis was performed using the standard protocol following bolus administration of intravenous contrast. CONTRAST:  112mL ISOVUE-300 IOPAMIDOL (ISOVUE-300) INJECTION 61% COMPARISON:  12/05/2015 FINDINGS: Lower chest:  No acute finding.  Partly seen postoperative changes. Hepatobiliary: No focal hepatic finding. Mild steatosis that was also seen previously. Borderline liver surface lobulation with widening of fissures.Cholecystectomy clips. Normal common bile duct diameter. Pancreas: Unremarkable. Spleen: Unremarkable. Adrenals/Urinary Tract: Negative adrenals. No hydronephrosis or stone. Small and few renal cystic densities. Unremarkable bladder. Stomach/Bowel: The colon is diffusely thick walled with hypervascularity and occasional pericolonic fat edema. The mucosa is hyperenhancing, especially notable where distended at the level of the sigmoid and rectum. No perforation or abscess. No appendicitis. Vascular/Lymphatic: No acute vascular abnormality.  Moderate atherosclerotic calcification of the aorta and iliacs. No mass or adenopathy. Reproductive:Hysterectomy.  Negative adnexae. Other: No ascites or pneumoperitoneum. Musculoskeletal: No acute abnormalities. IMPRESSION: 1. Pancolitis, often infectious with this appearance. 2. Hepatic steatosis. 3. Borderline liver surface lobulation, please correlate for cirrhosis risk factors. 4.  Aortic Atherosclerosis (ICD10-I70.0). Electronically Signed   By: Neva Seat.D.  On: 05/20/2017 15:25    Procedures Procedures (including critical care time)  Medications Ordered in ED Medications  potassium chloride 10 mEq in 100 mL IVPB (not administered)  sodium chloride 0.9 % bolus 1,000 mL (1,000 mLs Intravenous New Bag/Given 05/20/17 1406)  potassium chloride 10 mEq in 100 mL IVPB (10 mEq Intravenous New Bag/Given 05/20/17 1406)  potassium chloride SA (K-DUR,KLOR-CON) CR tablet 40 mEq (40 mEq Oral Given 05/20/17 1405)  iopamidol (ISOVUE-300) 61 % injection 100 mL (100 mLs Intravenous Contrast Given 05/20/17 1507)  Vancomycin PO   Initial Impression / Assessment and Plan / ED Course  I have reviewed the triage vital signs and the nursing notes.  Pertinent labs & imaging results that were available during my care of the patient were reviewed by me and considered in my medical decision making (see chart for details).     The patient's exam is rather unremarkable, she does have moderate diffuse pain but this is nonfocal, non-peritoneal. She has an elevated white blood cell count with worsening diarrhea and increasing pain suggestive of an acute process which I suspect is colitis. Given her recent antibiotic use would also consider C. difficile. She has had a prior cholecystectomy and reports that she has some element of chronic pain relative to both her liver and spleen that she has been unable to tell me why and prior imaging does not show any focal problems except for hepatic steatosis.  WBC >  12,000 C dif positive CT shows pancolitis K is low -   Pt has been ordered oral vancomycin - she has likely C dif colitis and with volume loss and diffuse abdominal pain would likely benefit from admission. D/w Dr. Blaine Hamper who will admit  Final Clinical Impressions(s) / ED Diagnoses   Final diagnoses:  C. difficile colitis  Hypokalemia    New Prescriptions New Prescriptions   No medications on file     Whitney Chapel, MD 05/20/17 1610

## 2017-05-20 NOTE — ED Notes (Signed)
Date and time results received: 05/20/17 4:03 PM (use smartphrase ".now" to insert current time)  Test: C. Diff Critical Value: Positive  Name of Provider Notified: Sabra Heck  Orders Received? Or Actions Taken?: Orders Received - See Orders for details

## 2017-05-20 NOTE — Progress Notes (Signed)
Pharmacy Antibiotic Note  [name] is a 71 y.o. female admitted on 05/20/2017 with C diff.  Pharmacy has been consulted for vancomycin dosing.  Plan: Vancomycin 250 mg po qid for 10 days.  Height: 5\' 3"  (160 cm) Weight: 144 lb (65.3 kg) IBW/kg (Calculated) : 52.4  Temp (24hrs), Avg:98.6 F (37 C), Min:98.6 F (37 C), Max:98.6 F (37 C)   Recent Labs Lab 05/20/17 1237  WBC 12.2*  CREATININE 0.83    Estimated Creatinine Clearance: 56.5 mL/min (by C-G formula based on SCr of 0.83 mg/dL).    Allergies  Allergen Reactions  . Aspirin Nausea Only    Thank you for allowing pharmacy to be a part of this patient's care.  Beverlee Nims 05/20/2017 4:36 PM

## 2017-05-21 DIAGNOSIS — R112 Nausea with vomiting, unspecified: Secondary | ICD-10-CM | POA: Diagnosis not present

## 2017-05-21 DIAGNOSIS — A0472 Enterocolitis due to Clostridium difficile, not specified as recurrent: Secondary | ICD-10-CM

## 2017-05-21 DIAGNOSIS — I2510 Atherosclerotic heart disease of native coronary artery without angina pectoris: Secondary | ICD-10-CM

## 2017-05-21 DIAGNOSIS — E782 Mixed hyperlipidemia: Secondary | ICD-10-CM

## 2017-05-21 DIAGNOSIS — I10 Essential (primary) hypertension: Secondary | ICD-10-CM | POA: Diagnosis not present

## 2017-05-21 DIAGNOSIS — E876 Hypokalemia: Secondary | ICD-10-CM

## 2017-05-21 LAB — GLUCOSE, CAPILLARY
GLUCOSE-CAPILLARY: 176 mg/dL — AB (ref 65–99)
GLUCOSE-CAPILLARY: 236 mg/dL — AB (ref 65–99)
Glucose-Capillary: 171 mg/dL — ABNORMAL HIGH (ref 65–99)
Glucose-Capillary: 164 mg/dL — ABNORMAL HIGH (ref 65–99)

## 2017-05-21 LAB — CBC
HEMATOCRIT: 32.6 % — AB (ref 36.0–46.0)
Hemoglobin: 11.4 g/dL — ABNORMAL LOW (ref 12.0–15.0)
MCH: 31.8 pg (ref 26.0–34.0)
MCHC: 35 g/dL (ref 30.0–36.0)
MCV: 90.8 fL (ref 78.0–100.0)
Platelets: 175 10*3/uL (ref 150–400)
RBC: 3.59 MIL/uL — AB (ref 3.87–5.11)
RDW: 13.2 % (ref 11.5–15.5)
WBC: 7.7 10*3/uL (ref 4.0–10.5)

## 2017-05-21 LAB — BASIC METABOLIC PANEL
ANION GAP: 9 (ref 5–15)
BUN: 6 mg/dL (ref 6–20)
CHLORIDE: 105 mmol/L (ref 101–111)
CO2: 23 mmol/L (ref 22–32)
Calcium: 8.4 mg/dL — ABNORMAL LOW (ref 8.9–10.3)
Creatinine, Ser: 0.54 mg/dL (ref 0.44–1.00)
Glucose, Bld: 199 mg/dL — ABNORMAL HIGH (ref 65–99)
POTASSIUM: 3.2 mmol/L — AB (ref 3.5–5.1)
SODIUM: 137 mmol/L (ref 135–145)

## 2017-05-21 LAB — HEMOGLOBIN A1C
Hgb A1c MFr Bld: 9 % — ABNORMAL HIGH (ref 4.8–5.6)
MEAN PLASMA GLUCOSE: 211.6 mg/dL

## 2017-05-21 MED ORDER — PANTOPRAZOLE SODIUM 40 MG PO TBEC
40.0000 mg | DELAYED_RELEASE_TABLET | Freq: Every day | ORAL | Status: DC
  Administered 2017-05-21 – 2017-05-22 (×2): 40 mg via ORAL
  Filled 2017-05-21 (×2): qty 1

## 2017-05-21 MED ORDER — POTASSIUM CHLORIDE IN NACL 20-0.9 MEQ/L-% IV SOLN
INTRAVENOUS | Status: DC
  Administered 2017-05-21: 12:00:00 via INTRAVENOUS

## 2017-05-21 NOTE — Progress Notes (Signed)
PROGRESS NOTE  [name] PYK:[id number] DOB: [birth year] DOA: 05/20/2017 PCP: Curlene Labrum, MD  Brief History:   71 y.o. female with medical history significant of hypertension, hyperlipidemia, diabetes mellitus, GERD, depression, CAD, s/p of CABG, s/p of stent placement, who presents with nausea, vomiting, diarrhea and abdominal pain x 4 days. The patient states that she had 5-6 loose bowel movements daily during this time without hematochezia. She had some dark stools, but she attributed this to taking Pepto-Bismol. She also has had 3-4 episodes of emesis daily. She complained of diffuse abdominal pain that had worsened on 05/20/2017. She stated that she was placed on antibiotics for "the flu" approximately 3 weeks prior to this admission. In addition, the patient was on antibiotics for "sores on my butt"proximal to 6 weeks prior to this admission. She denied any headache, chest pain, short of breath, coughing, fevers, chills. CT of the abdomen and pelvis showed diffuse colonic wall thickening with hypervascularity and occasional pericolonic fat edema.C. Difficile was positive. The patient was started on oral vancomycin.  Assessment/Plan: C. Difficile colitis -Continue vancomycin po -Avoid antimotility agents -lactic acid 1.4  Intractable nausea, vomiting -suspect a degree of gastritis -LFTs normal -lipase 29 -advance to full liquids -continue Protonix -continue IVF  CAD -pt not takine ASA -continue atenolol and lopid  Essential hypertension -continue amlodipine, atenolol, clonidine -Holding losartan and Lasix  Diabetes mellitus type 2 -Holding glipizide -Continue NovoLog sliding scale  Hyperlipidemia -continue Lopid  Depression -Continue Paxil  Hypokalemia -replete  -check mag--1.9   Disposition Plan:   Home in 1-2 days  Family Communication:   No Family at bedside--Total time spent 35 minutes.  Greater than 50% spent face to face counseling and  coordinating care.   Consultants:  none  Code Status:  FULL  DVT Prophylaxis:  [place] Lovenox   Procedures: As Listed in Progress Note Above  Antibiotics: None    Subjective: Patient feels that her abdominal pain and diarrhea are slowing down. She states that her vomiting is improving. She feels hungry and wants to eat. Denies any fevers, chills, chest pain or shortness of breath, cough, hemoptysis, hematochezia, melena.  Objective: Vitals:   05/20/17 1830 05/20/17 1911 05/20/17 2000 05/21/17 0442  BP: (!) 146/92 (!) 142/56 132/66 (!) 128/54  Pulse:  75  60  Resp:   17 16  Temp:   98.7 F (37.1 C) 99 F (37.2 C)  TempSrc:   Oral Oral  SpO2:   100% 98%  Weight:   65.3 kg (144 lb)   Height:   5\' 3"  (1.6 m)     Intake/Output Summary (Last 24 hours) at 05/21/17 0905 Last data filed at 05/21/17 0500  Gross per 24 hour  Intake              600 ml  Output                0 ml  Net              600 ml   Weight change:  Exam:   General:  Pt is alert, follows commands appropriately, not in acute distress  HEENT: No icterus, No thrush, No neck mass, [place]/AT  Cardiovascular: RRR, S1/S2, no rubs, no gallops  Respiratory: CTA bilaterally, no wheezing, no crackles, no rhonchi  Abdomen: Soft/+BS, diffuse tender, non distended, no guarding  Extremities: No edema, No lymphangitis, No petechiae, No rashes, no synovitis   Data  Reviewed: I have personally reviewed following labs and imaging studies Basic Metabolic Panel:  Recent Labs Lab 05/20/17 1237 05/21/17 0713  NA 134* 137  K 2.8* 3.2*  CL 99* 105  CO2 27 23  GLUCOSE 220* 199*  BUN 9 6  CREATININE 0.83 0.54  CALCIUM 9.1 8.4*  MG 1.9  --    Liver Function Tests:  Recent Labs Lab 05/20/17 1237  AST 14*  ALT 29  ALKPHOS 108  BILITOT 1.6*  PROT 7.0  ALBUMIN 3.7    Recent Labs Lab 05/20/17 1237  LIPASE 29   No results for input(s): AMMONIA in the last 168 hours. Coagulation Profile: No results for  input(s): INR, PROTIME in the last 168 hours. CBC:  Recent Labs Lab 05/20/17 1237 05/21/17 0713  WBC 12.2* 7.7  HGB 12.8 11.4*  HCT 36.1 32.6*  MCV 89.8 90.8  PLT 185 175   Cardiac Enzymes: No results for input(s): CKTOTAL, CKMB, CKMBINDEX, TROPONINI in the last 168 hours. BNP: Invalid input(s): POCBNP CBG:  Recent Labs Lab 05/20/17 1819 05/20/17 2025 05/21/17 0816  GLUCAP 241* 188* 176*   HbA1C: No results for input(s): HGBA1C in the last 72 hours. Urine analysis:    Component Value Date/Time   COLORURINE STRAW (A) 05/20/2017 1205   APPEARANCEUR CLEAR 05/20/2017 1205   LABSPEC 1.001 (L) 05/20/2017 1205   PHURINE 5.0 05/20/2017 1205   GLUCOSEU NEGATIVE 05/20/2017 1205   HGBUR NEGATIVE 05/20/2017 [place] 05/20/2017 1205   KETONESUR NEGATIVE 05/20/2017 1205   PROTEINUR NEGATIVE 05/20/2017 1205   UROBILINOGEN 0.2 05/19/2010 2155   NITRITE NEGATIVE 05/20/2017 1205   LEUKOCYTESUR NEGATIVE 05/20/2017 1205   Sepsis Labs: @LABRCNTIP (procalcitonin:4,lacticidven:4) ) Recent Results (from the past 240 hour(s))  Culture, blood (Routine X 2) w Reflex to ID Panel     Status: None (Preliminary result)   Collection Time: 05/20/17 12:37 PM  Result Value Ref Range Status   Specimen Description LEFT ANTECUBITAL  Final   Special Requests   Final    BOTTLES DRAWN AEROBIC AND ANAEROBIC Blood Culture adequate volume   Culture PENDING  Incomplete   Report Status PENDING  Incomplete  C difficile quick scan w PCR reflex     Status: Abnormal   Collection Time: 05/20/17  1:27 PM  Result Value Ref Range Status   C Diff antigen POSITIVE (A) NEGATIVE Final   C Diff toxin POSITIVE (A) NEGATIVE Final   C Diff interpretation Toxin producing C. difficile detected.  Final    Comment: CRITICAL RESULT CALLED TO, READ BACK BY AND VERIFIED WITH: PATRAW,B ON 05/20/17 AT 1600 BY LOY,C   Culture, blood (Routine X 2) w Reflex to ID Panel     Status: None (Preliminary result)     Collection Time: 05/20/17  5:27 PM  Result Value Ref Range Status   Specimen Description BLOOD RIGHT HAND  Final   Special Requests   Final    BOTTLES DRAWN AEROBIC AND ANAEROBIC Blood Culture adequate volume   Culture PENDING  Incomplete   Report Status PENDING  Incomplete     Scheduled Meds: . amLODipine  5 mg Oral Daily  . atenolol  75 mg Oral Daily  . cloNIDine  0.2 mg Oral QHS  . enoxaparin (LOVENOX) injection  40 mg Subcutaneous Q24H  . famotidine  20 mg Oral BID  . gemfibrozil  600 mg Oral BID  . insulin aspart  0-5 Units Subcutaneous QHS  . insulin aspart  0-9 Units Subcutaneous TID  WC  . losartan  100 mg Oral Daily  . ondansetron (ZOFRAN) IV  4 mg Intravenous Q8H  . PARoxetine  40 mg Oral q morning - 10a  . vancomycin  125 mg Oral QID  . zolpidem  5 mg Oral QHS   Continuous Infusions: . sodium chloride 75 mL/hr at 05/20/17 2018    Procedures/Studies: Ct Abdomen Pelvis W Contrast  Result Date: 05/20/2017 CLINICAL DATA:  Unspecified abdominal pain. Nausea and diarrhea with poor appetite for 4 days. EXAM: CT ABDOMEN AND PELVIS WITH CONTRAST TECHNIQUE: Multidetector CT imaging of the abdomen and pelvis was performed using the standard protocol following bolus administration of intravenous contrast. CONTRAST:  122mL ISOVUE-300 IOPAMIDOL (ISOVUE-300) INJECTION 61% COMPARISON:  12/05/2015 FINDINGS: Lower chest:  No acute finding.  Partly seen postoperative changes. Hepatobiliary: No focal hepatic finding. Mild steatosis that was also seen previously. Borderline liver surface lobulation with widening of fissures.Cholecystectomy clips. Normal common bile duct diameter. Pancreas: Unremarkable. Spleen: Unremarkable. Adrenals/Urinary Tract: Negative adrenals. No hydronephrosis or stone. Small and few renal cystic densities. Unremarkable bladder. Stomach/Bowel: The colon is diffusely thick walled with hypervascularity and occasional pericolonic fat edema. The mucosa is hyperenhancing,  especially notable where distended at the level of the sigmoid and rectum. No perforation or abscess. No appendicitis. Vascular/Lymphatic: No acute vascular abnormality. Moderate atherosclerotic calcification of the aorta and iliacs. No mass or adenopathy. Reproductive:Hysterectomy.  Negative adnexae. Other: No ascites or pneumoperitoneum. Musculoskeletal: No acute abnormalities. IMPRESSION: 1. Pancolitis, often infectious with this appearance. 2. Hepatic steatosis. 3. Borderline liver surface lobulation, please correlate for cirrhosis risk factors. 4.  Aortic Atherosclerosis (ICD10-I70.0). Electronically Signed   By: Monte Fantasia M.D.   On: 05/20/2017 15:25    Talissa Apple, DO  [facility] Pager [phone]  If 7PM-7AM, please contact night-coverage [email or web address] Password [email or web address] 05/21/2017, 9:05 AM   LOS: 0 days

## 2017-05-22 DIAGNOSIS — R1110 Vomiting, unspecified: Secondary | ICD-10-CM | POA: Diagnosis not present

## 2017-05-22 DIAGNOSIS — R112 Nausea with vomiting, unspecified: Secondary | ICD-10-CM | POA: Diagnosis not present

## 2017-05-22 DIAGNOSIS — A0472 Enterocolitis due to Clostridium difficile, not specified as recurrent: Secondary | ICD-10-CM | POA: Diagnosis not present

## 2017-05-22 DIAGNOSIS — E1165 Type 2 diabetes mellitus with hyperglycemia: Secondary | ICD-10-CM | POA: Diagnosis not present

## 2017-05-22 DIAGNOSIS — I10 Essential (primary) hypertension: Secondary | ICD-10-CM | POA: Diagnosis not present

## 2017-05-22 DIAGNOSIS — I2510 Atherosclerotic heart disease of native coronary artery without angina pectoris: Secondary | ICD-10-CM | POA: Diagnosis not present

## 2017-05-22 LAB — BASIC METABOLIC PANEL
Anion gap: 10 (ref 5–15)
CHLORIDE: 108 mmol/L (ref 101–111)
CO2: 22 mmol/L (ref 22–32)
CREATININE: 0.49 mg/dL (ref 0.44–1.00)
Calcium: 8.6 mg/dL — ABNORMAL LOW (ref 8.9–10.3)
GFR calc Af Amer: >60 mL/min (ref >60)
GFR calc non Af Amer: >60 mL/min (ref >60)
Glucose, Bld: 205 mg/dL — ABNORMAL HIGH (ref 65–99)
Potassium: 3.4 mmol/L — ABNORMAL LOW (ref 3.5–5.1)
Sodium: 140 mmol/L (ref 135–145)

## 2017-05-22 LAB — GLUCOSE, CAPILLARY
GLUCOSE-CAPILLARY: 201 mg/dL — AB (ref 65–99)
Glucose-Capillary: 194 mg/dL — ABNORMAL HIGH (ref 65–99)

## 2017-05-22 LAB — MAGNESIUM: Magnesium: 1.9 mg/dL (ref 1.7–2.4)

## 2017-05-22 MED ORDER — VANCOMYCIN 50 MG/ML ORAL SOLUTION: 125.0000 mg | Freq: Four times a day (QID) | ORAL | 0 refills | Status: DC

## 2017-05-22 NOTE — Discharge Summary (Signed)
Physician Discharge Summary  LORITA FORINASH ION:[id number] DOB: Dec 29, 1945 DOA: 05/20/2017  PCP: Curlene Labrum, MD  Admit date: 05/20/2017 Discharge date: 05/22/2017  Admitted From: Home Disposition:  Home   Recommendations for Outpatient Follow-up:  1. Follow up with PCP in 1-2 weeks 2. Please obtain BMP/CBC in one week   Discharge Condition: Stable CODE STATUS: FULL Diet recommendation: soft    Brief/Interim Summary: 71 y.o.femalewith medical history significant of hypertension, hyperlipidemia, diabetes mellitus, GERD, depression, CAD, s/p of CABG, s/p of stent placement, who presents with nausea, vomiting, diarrhea and abdominal pain x 4 days. The patient states that she had 5-6 loose bowel movements daily during this time without hematochezia. She had some dark stools, but she attributed this to taking Pepto-Bismol. She also has had 3-4 episodes of emesis daily. She complained of diffuse abdominal pain that had worsened on 05/20/2017. She stated that she was placed on antibiotics for "the flu" approximately 3 weeks prior to this admission. In addition, the patient was on antibiotics for "sores on my butt"proximal to 6 weeks prior to this admission. She denied any headache, chest pain, short of breath, coughing, fevers, chills. CT of the abdomen and pelvis showed diffuse colonic wall thickening with hypervascularity and occasional pericolonic fat edema.C. Difficile was positive. The patient was started on oral vancomycin.  Discharge Diagnoses:  C. Difficile colitis -Continue vancomycin po--plan 9 more days after d/c to complete 10 d course -Avoid antimotility agents -lactic acid 1.4 -clinically improving during hospitalization--abd pain improved with decreased stools  Intractable nausea, vomiting -suspect a degree of gastritis -LFTs normal -lipase 29 -advanced to soft diet-->tolerated -continued Protonix -continued IVF  CAD -pt not taking ASA due to  nausea -continue atenolol and lopid  Essential hypertension -continue amlodipine, atenolol, clonidine -Holding losartan and Lasix  Diabetes mellitus type 2 -Holding glipizide--restart after d/c -Continue NovoLog sliding scale -05/21/17 HbA1C = 9.0-->follow up with PCP for intensification of medication regimen  Hyperlipidemia -continue Lopid  Depression -Continue Paxil  Hypokalemia -replete  -check mag--1.9  Discharge Instructions  Discharge Instructions    Diet - low sodium heart healthy    Complete by:  As directed    Increase activity slowly    Complete by:  As directed      Allergies as of 05/22/2017      Reactions   Aspirin Nausea Only      Medication List    STOP taking these medications   cyclobenzaprine 10 MG tablet Commonly known as:  FLEXERIL   meloxicam 7.5 MG tablet Commonly known as:  MOBIC   omeprazole 40 MG capsule Commonly known as:  PRILOSEC     TAKE these medications   amLODipine 5 MG tablet Commonly known as:  NORVASC Take 1 tablet (5 mg total) by mouth daily.   aspirin EC 81 MG tablet Take 1 tablet (81 mg total) by mouth daily.   atenolol 50 MG tablet Commonly known as:  TENORMIN Take 75 mg by mouth daily.   cloNIDine 0.2 MG tablet Commonly known as:  CATAPRES Take 1 tablet (0.2 mg total) by mouth at bedtime.   famotidine 20 MG tablet Commonly known as:  PEPCID Take 1 tablet (20 mg total) by mouth 2 (two) times daily.   furosemide 40 MG tablet Commonly known as:  LASIX Take 40 mg by mouth daily.   gemfibrozil 600 MG tablet Commonly known as:  LOPID Take 600 mg by mouth 2 (two) times daily.   glipiZIDE 10 MG tablet Commonly known  as:  GLUCOTROL Take 10 mg by mouth 2 (two) times daily.   HYDROcodone-acetaminophen 5-325 MG tablet Commonly known as:  NORCO/VICODIN Take 1 tablet by mouth every 6 (six) hours as needed for moderate pain.   losartan 100 MG tablet Commonly known as:  COZAAR Take 100 mg by mouth  daily.   neomycin-bacitracin-polymyxin [phone] ointment Apply 1 application topically 2 (two) times daily as needed (irritation).   nitroGLYCERIN 0.4 MG SL tablet Commonly known as:  NITROSTAT Place 1 tablet (0.4 mg total) under the tongue every 5 (five) minutes as needed (up to 3 doses). For chest pain   PARoxetine 40 MG tablet Commonly known as:  PAXIL Take 40 mg by mouth every morning.   PROBIOTIC-10 Caps Take 10 mg by mouth daily.   vancomycin 50 mg/mL oral solution Commonly known as:  VANCOCIN Take 2.5 mLs (125 mg total) by mouth 4 (four) times daily.   zolpidem 10 MG tablet Commonly known as:  AMBIEN Take 5-10 mg by mouth at bedtime.       Allergies  Allergen Reactions  . Aspirin Nausea Only    Consultations:  none   Procedures/Studies: Ct Abdomen Pelvis W Contrast  Result Date: 05/20/2017 CLINICAL DATA:  Unspecified abdominal pain. Nausea and diarrhea with poor appetite for 4 days. EXAM: CT ABDOMEN AND PELVIS WITH CONTRAST TECHNIQUE: Multidetector CT imaging of the abdomen and pelvis was performed using the standard protocol following bolus administration of intravenous contrast. CONTRAST:  137mL ISOVUE-300 IOPAMIDOL (ISOVUE-300) INJECTION 61% COMPARISON:  12/05/2015 FINDINGS: Lower chest:  No acute finding.  Partly seen postoperative changes. Hepatobiliary: No focal hepatic finding. Mild steatosis that was also seen previously. Borderline liver surface lobulation with widening of fissures.Cholecystectomy clips. Normal common bile duct diameter. Pancreas: Unremarkable. Spleen: Unremarkable. Adrenals/Urinary Tract: Negative adrenals. No hydronephrosis or stone. Small and few renal cystic densities. Unremarkable bladder. Stomach/Bowel: The colon is diffusely thick walled with hypervascularity and occasional pericolonic fat edema. The mucosa is hyperenhancing, especially notable where distended at the level of the sigmoid and rectum. No perforation or abscess. No  appendicitis. Vascular/Lymphatic: No acute vascular abnormality. Moderate atherosclerotic calcification of the aorta and iliacs. No mass or adenopathy. Reproductive:Hysterectomy.  Negative adnexae. Other: No ascites or pneumoperitoneum. Musculoskeletal: No acute abnormalities. IMPRESSION: 1. Pancolitis, often infectious with this appearance. 2. Hepatic steatosis. 3. Borderline liver surface lobulation, please correlate for cirrhosis risk factors. 4.  Aortic Atherosclerosis (ICD10-I70.0). Electronically Signed   By: Monte Fantasia M.D.   On: 05/20/2017 15:25        Discharge Exam: Vitals:   05/21/17 1300 05/22/17 0500  BP: 129/65 (!) 143/61  Pulse: 63 (!) 55  Resp: 18 18  Temp: 98.6 F (37 C) 98.4 F (36.9 C)  SpO2: 99% 100%   Vitals:   05/20/17 2000 05/21/17 0442 05/21/17 1300 05/22/17 0500  BP: 132/66 (!) 128/54 129/65 (!) 143/61  Pulse:  60 63 (!) 55  Resp: 17 16 18 18   Temp: 98.7 F (37.1 C) 99 F (37.2 C) 98.6 F (37 C) 98.4 F (36.9 C)  TempSrc: Oral Oral Oral Oral  SpO2: 100% 98% 99% 100%  Weight: 65.3 kg (144 lb)     Height: 5\' 3"  (1.6 m)       General: Pt is alert, awake, not in acute distress Cardiovascular: RRR, S1/S2 +, no rubs, no gallops Respiratory: CTA bilaterally, no wheezing, no rhonchi Abdominal: Soft, NT, ND, bowel sounds + Extremities: no edema, no cyanosis   The results of significant diagnostics  from this hospitalization (including imaging, microbiology, ancillary and laboratory) are listed below for reference.    Significant Diagnostic Studies: Ct Abdomen Pelvis W Contrast  Result Date: 05/20/2017 CLINICAL DATA:  Unspecified abdominal pain. Nausea and diarrhea with poor appetite for 4 days. EXAM: CT ABDOMEN AND PELVIS WITH CONTRAST TECHNIQUE: Multidetector CT imaging of the abdomen and pelvis was performed using the standard protocol following bolus administration of intravenous contrast. CONTRAST:  147mL ISOVUE-300 IOPAMIDOL (ISOVUE-300)  INJECTION 61% COMPARISON:  12/05/2015 FINDINGS: Lower chest:  No acute finding.  Partly seen postoperative changes. Hepatobiliary: No focal hepatic finding. Mild steatosis that was also seen previously. Borderline liver surface lobulation with widening of fissures.Cholecystectomy clips. Normal common bile duct diameter. Pancreas: Unremarkable. Spleen: Unremarkable. Adrenals/Urinary Tract: Negative adrenals. No hydronephrosis or stone. Small and few renal cystic densities. Unremarkable bladder. Stomach/Bowel: The colon is diffusely thick walled with hypervascularity and occasional pericolonic fat edema. The mucosa is hyperenhancing, especially notable where distended at the level of the sigmoid and rectum. No perforation or abscess. No appendicitis. Vascular/Lymphatic: No acute vascular abnormality. Moderate atherosclerotic calcification of the aorta and iliacs. No mass or adenopathy. Reproductive:Hysterectomy.  Negative adnexae. Other: No ascites or pneumoperitoneum. Musculoskeletal: No acute abnormalities. IMPRESSION: 1. Pancolitis, often infectious with this appearance. 2. Hepatic steatosis. 3. Borderline liver surface lobulation, please correlate for cirrhosis risk factors. 4.  Aortic Atherosclerosis (ICD10-I70.0). Electronically Signed   By: Monte Fantasia M.D.   On: 05/20/2017 15:25     Microbiology: Recent Results (from the past 240 hour(s))  Culture, blood (Routine X 2) w Reflex to ID Panel     Status: None (Preliminary result)   Collection Time: 05/20/17 12:37 PM  Result Value Ref Range Status   Specimen Description LEFT ANTECUBITAL  Final   Special Requests   Final    BOTTLES DRAWN AEROBIC AND ANAEROBIC Blood Culture adequate volume   Culture NO GROWTH < 24 HOURS  Final   Report Status PENDING  Incomplete  C difficile quick scan w PCR reflex     Status: Abnormal   Collection Time: 05/20/17  1:27 PM  Result Value Ref Range Status   C Diff antigen POSITIVE (A) NEGATIVE Final   C Diff toxin  POSITIVE (A) NEGATIVE Final   C Diff interpretation Toxin producing C. difficile detected.  Final    Comment: CRITICAL RESULT CALLED TO, READ BACK BY AND VERIFIED WITH: PATRAW,B ON 05/20/17 AT 1600 BY LOY,C   Culture, blood (Routine X 2) w Reflex to ID Panel     Status: None (Preliminary result)   Collection Time: 05/20/17  5:27 PM  Result Value Ref Range Status   Specimen Description BLOOD RIGHT HAND  Final   Special Requests   Final    BOTTLES DRAWN AEROBIC AND ANAEROBIC Blood Culture adequate volume   Culture NO GROWTH < 24 HOURS  Final   Report Status PENDING  Incomplete     Labs: Basic Metabolic Panel:  Recent Labs Lab 05/20/17 1237 05/21/17 0713  NA 134* 137  K 2.8* 3.2*  CL 99* 105  CO2 27 23  GLUCOSE 220* 199*  BUN 9 6  CREATININE 0.83 0.54  CALCIUM 9.1 8.4*  MG 1.9  --    Liver Function Tests:  Recent Labs Lab 05/20/17 1237  AST 14*  ALT 29  ALKPHOS 108  BILITOT 1.6*  PROT 7.0  ALBUMIN 3.7    Recent Labs Lab 05/20/17 1237  LIPASE 29   No results for input(s): AMMONIA in the last  168 hours. CBC:  Recent Labs Lab 05/20/17 1237 05/21/17 0713  WBC 12.2* 7.7  HGB 12.8 11.4*  HCT 36.1 32.6*  MCV 89.8 90.8  PLT 185 175   Cardiac Enzymes: No results for input(s): CKTOTAL, CKMB, CKMBINDEX, TROPONINI in the last 168 hours. BNP: Invalid input(s): POCBNP CBG:  Recent Labs Lab 05/21/17 0816 05/21/17 1159 05/21/17 1701 05/21/17 2158 05/22/17 0724  GLUCAP 176* 236* 171* 164* 194*    Time coordinating discharge:  Greater than 30 minutes  Signed:  Nichole Neyer, DO [facility] Pager: [phone] 05/22/2017, 9:45 AM

## 2017-05-25 LAB — CULTURE, BLOOD (ROUTINE X 2)
Culture: NO GROWTH
Culture: NO GROWTH
Special Requests: ADEQUATE
Special Requests: ADEQUATE

## 2017-06-01 DIAGNOSIS — E1165 Type 2 diabetes mellitus with hyperglycemia: Secondary | ICD-10-CM | POA: Diagnosis not present

## 2017-06-01 DIAGNOSIS — E782 Mixed hyperlipidemia: Secondary | ICD-10-CM | POA: Diagnosis not present

## 2017-06-01 DIAGNOSIS — A0472 Enterocolitis due to Clostridium difficile, not specified as recurrent: Secondary | ICD-10-CM | POA: Diagnosis not present

## 2017-06-01 DIAGNOSIS — I10 Essential (primary) hypertension: Secondary | ICD-10-CM | POA: Diagnosis not present

## 2017-06-01 DIAGNOSIS — Z23 Encounter for immunization: Secondary | ICD-10-CM | POA: Diagnosis not present

## 2017-06-01 DIAGNOSIS — E876 Hypokalemia: Secondary | ICD-10-CM | POA: Diagnosis not present

## 2017-06-01 DIAGNOSIS — Z6824 Body mass index (BMI) 24.0-24.9, adult: Secondary | ICD-10-CM | POA: Diagnosis not present

## 2017-06-01 DIAGNOSIS — E039 Hypothyroidism, unspecified: Secondary | ICD-10-CM | POA: Diagnosis not present

## 2017-06-01 DIAGNOSIS — I5032 Chronic diastolic (congestive) heart failure: Secondary | ICD-10-CM | POA: Diagnosis not present

## 2017-06-01 DIAGNOSIS — K760 Fatty (change of) liver, not elsewhere classified: Secondary | ICD-10-CM | POA: Diagnosis not present

## 2017-06-01 DIAGNOSIS — K219 Gastro-esophageal reflux disease without esophagitis: Secondary | ICD-10-CM | POA: Diagnosis not present

## 2017-06-01 DIAGNOSIS — F3289 Other specified depressive episodes: Secondary | ICD-10-CM | POA: Diagnosis not present

## 2017-06-01 DIAGNOSIS — F411 Generalized anxiety disorder: Secondary | ICD-10-CM | POA: Diagnosis not present

## 2017-06-13 DIAGNOSIS — I10 Essential (primary) hypertension: Secondary | ICD-10-CM | POA: Diagnosis not present

## 2017-06-13 DIAGNOSIS — K219 Gastro-esophageal reflux disease without esophagitis: Secondary | ICD-10-CM | POA: Diagnosis not present

## 2017-06-13 DIAGNOSIS — F411 Generalized anxiety disorder: Secondary | ICD-10-CM | POA: Diagnosis not present

## 2017-06-13 DIAGNOSIS — K760 Fatty (change of) liver, not elsewhere classified: Secondary | ICD-10-CM | POA: Diagnosis not present

## 2017-06-13 DIAGNOSIS — E876 Hypokalemia: Secondary | ICD-10-CM | POA: Diagnosis not present

## 2017-06-13 DIAGNOSIS — E039 Hypothyroidism, unspecified: Secondary | ICD-10-CM | POA: Diagnosis not present

## 2017-06-13 DIAGNOSIS — N183 Chronic kidney disease, stage 3 (moderate): Secondary | ICD-10-CM | POA: Diagnosis not present

## 2017-06-13 DIAGNOSIS — E782 Mixed hyperlipidemia: Secondary | ICD-10-CM | POA: Diagnosis not present

## 2017-06-13 DIAGNOSIS — E1165 Type 2 diabetes mellitus with hyperglycemia: Secondary | ICD-10-CM | POA: Diagnosis not present

## 2017-06-13 DIAGNOSIS — I5032 Chronic diastolic (congestive) heart failure: Secondary | ICD-10-CM | POA: Diagnosis not present

## 2017-06-15 DIAGNOSIS — I10 Essential (primary) hypertension: Secondary | ICD-10-CM | POA: Diagnosis not present

## 2017-06-15 DIAGNOSIS — E782 Mixed hyperlipidemia: Secondary | ICD-10-CM | POA: Diagnosis not present

## 2017-06-15 DIAGNOSIS — Z0001 Encounter for general adult medical examination with abnormal findings: Secondary | ICD-10-CM | POA: Diagnosis not present

## 2017-06-15 DIAGNOSIS — I5032 Chronic diastolic (congestive) heart failure: Secondary | ICD-10-CM | POA: Diagnosis not present

## 2017-06-15 DIAGNOSIS — Z6824 Body mass index (BMI) 24.0-24.9, adult: Secondary | ICD-10-CM | POA: Diagnosis not present

## 2017-06-15 DIAGNOSIS — K760 Fatty (change of) liver, not elsewhere classified: Secondary | ICD-10-CM | POA: Diagnosis not present

## 2017-06-15 DIAGNOSIS — E1165 Type 2 diabetes mellitus with hyperglycemia: Secondary | ICD-10-CM | POA: Diagnosis not present

## 2017-06-15 DIAGNOSIS — E559 Vitamin D deficiency, unspecified: Secondary | ICD-10-CM | POA: Diagnosis not present

## 2017-06-21 DIAGNOSIS — E876 Hypokalemia: Secondary | ICD-10-CM | POA: Diagnosis not present

## 2017-06-21 DIAGNOSIS — E1165 Type 2 diabetes mellitus with hyperglycemia: Secondary | ICD-10-CM | POA: Diagnosis not present

## 2017-06-21 DIAGNOSIS — K219 Gastro-esophageal reflux disease without esophagitis: Secondary | ICD-10-CM | POA: Diagnosis not present

## 2017-06-21 DIAGNOSIS — I5032 Chronic diastolic (congestive) heart failure: Secondary | ICD-10-CM | POA: Diagnosis not present

## 2017-06-21 DIAGNOSIS — E782 Mixed hyperlipidemia: Secondary | ICD-10-CM | POA: Diagnosis not present

## 2017-06-21 DIAGNOSIS — E039 Hypothyroidism, unspecified: Secondary | ICD-10-CM | POA: Diagnosis not present

## 2017-06-21 DIAGNOSIS — A0472 Enterocolitis due to Clostridium difficile, not specified as recurrent: Secondary | ICD-10-CM | POA: Diagnosis not present

## 2017-06-21 DIAGNOSIS — I10 Essential (primary) hypertension: Secondary | ICD-10-CM | POA: Diagnosis not present

## 2017-09-13 DIAGNOSIS — K760 Fatty (change of) liver, not elsewhere classified: Secondary | ICD-10-CM | POA: Diagnosis not present

## 2017-09-13 DIAGNOSIS — Z0001 Encounter for general adult medical examination with abnormal findings: Secondary | ICD-10-CM | POA: Diagnosis not present

## 2017-09-13 DIAGNOSIS — I5032 Chronic diastolic (congestive) heart failure: Secondary | ICD-10-CM | POA: Diagnosis not present

## 2017-09-13 DIAGNOSIS — E1165 Type 2 diabetes mellitus with hyperglycemia: Secondary | ICD-10-CM | POA: Diagnosis not present

## 2017-09-13 DIAGNOSIS — F411 Generalized anxiety disorder: Secondary | ICD-10-CM | POA: Diagnosis not present

## 2017-09-13 DIAGNOSIS — R300 Dysuria: Secondary | ICD-10-CM | POA: Diagnosis not present

## 2017-09-13 DIAGNOSIS — E039 Hypothyroidism, unspecified: Secondary | ICD-10-CM | POA: Diagnosis not present

## 2017-09-13 DIAGNOSIS — F3289 Other specified depressive episodes: Secondary | ICD-10-CM | POA: Diagnosis not present

## 2017-09-13 DIAGNOSIS — N183 Chronic kidney disease, stage 3 (moderate): Secondary | ICD-10-CM | POA: Diagnosis not present

## 2017-09-13 DIAGNOSIS — K219 Gastro-esophageal reflux disease without esophagitis: Secondary | ICD-10-CM | POA: Diagnosis not present

## 2017-09-13 DIAGNOSIS — E782 Mixed hyperlipidemia: Secondary | ICD-10-CM | POA: Diagnosis not present

## 2017-09-13 DIAGNOSIS — I10 Essential (primary) hypertension: Secondary | ICD-10-CM | POA: Diagnosis not present

## 2017-12-07 DIAGNOSIS — E1165 Type 2 diabetes mellitus with hyperglycemia: Secondary | ICD-10-CM | POA: Diagnosis not present

## 2017-12-07 DIAGNOSIS — E039 Hypothyroidism, unspecified: Secondary | ICD-10-CM | POA: Diagnosis not present

## 2017-12-07 DIAGNOSIS — K219 Gastro-esophageal reflux disease without esophagitis: Secondary | ICD-10-CM | POA: Diagnosis not present

## 2017-12-07 DIAGNOSIS — I5032 Chronic diastolic (congestive) heart failure: Secondary | ICD-10-CM | POA: Diagnosis not present

## 2017-12-07 DIAGNOSIS — I10 Essential (primary) hypertension: Secondary | ICD-10-CM | POA: Diagnosis not present

## 2017-12-07 DIAGNOSIS — E876 Hypokalemia: Secondary | ICD-10-CM | POA: Diagnosis not present

## 2017-12-07 DIAGNOSIS — E782 Mixed hyperlipidemia: Secondary | ICD-10-CM | POA: Diagnosis not present

## 2017-12-07 DIAGNOSIS — N183 Chronic kidney disease, stage 3 (moderate): Secondary | ICD-10-CM | POA: Diagnosis not present

## 2017-12-12 DIAGNOSIS — E782 Mixed hyperlipidemia: Secondary | ICD-10-CM | POA: Diagnosis not present

## 2017-12-12 DIAGNOSIS — E039 Hypothyroidism, unspecified: Secondary | ICD-10-CM | POA: Diagnosis not present

## 2017-12-12 DIAGNOSIS — I10 Essential (primary) hypertension: Secondary | ICD-10-CM | POA: Diagnosis not present

## 2017-12-12 DIAGNOSIS — E1165 Type 2 diabetes mellitus with hyperglycemia: Secondary | ICD-10-CM | POA: Diagnosis not present

## 2017-12-12 DIAGNOSIS — R1084 Generalized abdominal pain: Secondary | ICD-10-CM | POA: Diagnosis not present

## 2017-12-12 DIAGNOSIS — Z6825 Body mass index (BMI) 25.0-25.9, adult: Secondary | ICD-10-CM | POA: Diagnosis not present

## 2017-12-12 DIAGNOSIS — K760 Fatty (change of) liver, not elsewhere classified: Secondary | ICD-10-CM | POA: Diagnosis not present

## 2017-12-12 DIAGNOSIS — I5032 Chronic diastolic (congestive) heart failure: Secondary | ICD-10-CM | POA: Diagnosis not present

## 2017-12-16 DIAGNOSIS — N183 Chronic kidney disease, stage 3 (moderate): Secondary | ICD-10-CM | POA: Diagnosis not present

## 2017-12-16 DIAGNOSIS — R1084 Generalized abdominal pain: Secondary | ICD-10-CM | POA: Diagnosis not present

## 2017-12-16 DIAGNOSIS — Z9889 Other specified postprocedural states: Secondary | ICD-10-CM | POA: Diagnosis not present

## 2017-12-16 DIAGNOSIS — I700 Atherosclerosis of aorta: Secondary | ICD-10-CM | POA: Diagnosis not present

## 2018-01-31 ENCOUNTER — Encounter (HOSPITAL_COMMUNITY): Payer: Self-pay | Admitting: Emergency Medicine

## 2018-01-31 ENCOUNTER — Emergency Department (HOSPITAL_COMMUNITY): Payer: Medicare Other

## 2018-01-31 ENCOUNTER — Emergency Department (HOSPITAL_COMMUNITY)
Admission: EM | Admit: 2018-01-31 | Discharge: 2018-01-31 | Disposition: A | Discharge status: Home / Self Care | Payer: Medicare Other | Attending: Emergency Medicine | Admitting: Emergency Medicine

## 2018-01-31 ENCOUNTER — Other Ambulatory Visit: Payer: Self-pay

## 2018-01-31 DIAGNOSIS — F329 Major depressive disorder, single episode, unspecified: Secondary | ICD-10-CM | POA: Diagnosis not present

## 2018-01-31 DIAGNOSIS — E119 Type 2 diabetes mellitus without complications: Secondary | ICD-10-CM | POA: Insufficient documentation

## 2018-01-31 DIAGNOSIS — R1084 Generalized abdominal pain: Secondary | ICD-10-CM | POA: Insufficient documentation

## 2018-01-31 DIAGNOSIS — I2510 Atherosclerotic heart disease of native coronary artery without angina pectoris: Secondary | ICD-10-CM | POA: Diagnosis not present

## 2018-01-31 DIAGNOSIS — K449 Diaphragmatic hernia without obstruction or gangrene: Secondary | ICD-10-CM | POA: Diagnosis not present

## 2018-01-31 DIAGNOSIS — I252 Old myocardial infarction: Secondary | ICD-10-CM | POA: Diagnosis not present

## 2018-01-31 DIAGNOSIS — Z9049 Acquired absence of other specified parts of digestive tract: Secondary | ICD-10-CM | POA: Diagnosis not present

## 2018-01-31 DIAGNOSIS — R197 Diarrhea, unspecified: Secondary | ICD-10-CM

## 2018-01-31 DIAGNOSIS — R112 Nausea with vomiting, unspecified: Secondary | ICD-10-CM | POA: Diagnosis not present

## 2018-01-31 DIAGNOSIS — I10 Essential (primary) hypertension: Secondary | ICD-10-CM | POA: Insufficient documentation

## 2018-01-31 LAB — CBC
HCT: 43 % (ref 36.0–46.0)
Hemoglobin: 15.6 g/dL — ABNORMAL HIGH (ref 12.0–15.0)
MCH: 33.1 pg (ref 26.0–34.0)
MCHC: 36.3 g/dL — ABNORMAL HIGH (ref 30.0–36.0)
MCV: 91.1 fL (ref 78.0–100.0)
PLATELETS: 266 10*3/uL (ref 150–400)
RBC: 4.72 MIL/uL (ref 3.87–5.11)
RDW: 12.5 % (ref 11.5–15.5)
WBC: 7.2 10*3/uL (ref 4.0–10.5)

## 2018-01-31 LAB — COMPREHENSIVE METABOLIC PANEL
ALK PHOS: 136 U/L — AB (ref 38–126)
ALT: 40 U/L (ref 0–44)
AST: 37 U/L (ref 15–41)
Albumin: 5.1 g/dL — ABNORMAL HIGH (ref 3.5–5.0)
Anion gap: 13 (ref 5–15)
BUN: 20 mg/dL (ref 8–23)
CALCIUM: 10.2 mg/dL (ref 8.9–10.3)
CO2: 25 mmol/L (ref 22–32)
CREATININE: 0.86 mg/dL (ref 0.44–1.00)
Chloride: 97 mmol/L — ABNORMAL LOW (ref 98–111)
Glucose, Bld: 375 mg/dL — ABNORMAL HIGH (ref 70–99)
Potassium: 3.6 mmol/L (ref 3.5–5.1)
Sodium: 135 mmol/L (ref 135–145)
Total Bilirubin: 1.6 mg/dL — ABNORMAL HIGH (ref 0.3–1.2)
Total Protein: 8.8 g/dL — ABNORMAL HIGH (ref 6.5–8.1)

## 2018-01-31 LAB — URINALYSIS, ROUTINE W REFLEX MICROSCOPIC
BILIRUBIN URINE: NEGATIVE
HGB URINE DIPSTICK: NEGATIVE
KETONES UR: NEGATIVE mg/dL
LEUKOCYTES UA: NEGATIVE
NITRITE: NEGATIVE
PH: 5 (ref 5.0–8.0)
Protein, ur: NEGATIVE mg/dL
SPECIFIC GRAVITY, URINE: 1.011 (ref 1.005–1.030)

## 2018-01-31 LAB — LIPASE, BLOOD: Lipase: 46 U/L (ref 11–51)

## 2018-01-31 MED ORDER — FENTANYL CITRATE (PF) 100 MCG/2ML IJ SOLN
50.0000 ug | Freq: Once | INTRAMUSCULAR | Status: AC
  Administered 2018-01-31: 50 ug via INTRAVENOUS
  Filled 2018-01-31: qty 2

## 2018-01-31 MED ORDER — IOPAMIDOL (ISOVUE-300) INJECTION 61%
100.0000 mL | Freq: Once | INTRAVENOUS | Status: AC | PRN
  Administered 2018-01-31: 100 mL via INTRAVENOUS

## 2018-01-31 NOTE — ED Notes (Signed)
Patient transported to CT 

## 2018-01-31 NOTE — ED Provider Notes (Signed)
Emergency Department Provider Note   I have reviewed the triage vital signs and the nursing notes.   HISTORY  Chief Complaint Abdominal Pain   HPI [name] is a 72 y.o. female medical problems as documented below who presents the emergency department today with diarrhea and abdominal pain.  Patient states this feels like the last time she had C. difficile colitis back in October.  About a month ago she was constipated took some laxative and prune juice to get over that but then when she stopped those medications it came back again.  And she is been trying to get that better but then on Sunday she started having diarrhea and severe abdominal pain.  The diarrhea seemed to be mucousy and had a foul odor to it so she fell with it for couple days and getting better so she came here for evaluation.  She does some nausea 1 or 2 episodes of vomiting but has tolerated p.o. this morning.  She has a history of a mesh repair of abdominal hernia but that was a while ago she never had any complications besides pain.  No fevers.  No sick contacts. No other associated or modifying symptoms.    Past Medical History:  Diagnosis Date  . Acute myocardial infarction, unspecified site, episode of care unspecified   . Coronary artery disease status post CABG 2005    a. s/p CABG 2005. b. 2011 - DES of SVG to PDA. c. Admitted with CP 03/2012 with stable anatomy by cath, felt noncardiac pain.  . Depression   . Diabetes mellitus   . Elevated LFTs   . Elevated transaminase level    Most recently October 2012 most recent  . Esophageal reflux   . GERD (gastroesophageal reflux disease)   . Hernia   . History of cholecystectomy   . Hyperlipidemia    a. Not on statin due to elevated liver enzymes.  . Hypertension   . Kidney cysts    left    Patient Active Problem List   Diagnosis Date Noted  . Uncontrolled type 2 diabetes mellitus with hyperglycemia ([place]) 05/22/2017  . Intractable vomiting with  nausea 05/21/2017  . Diabetes mellitus without complication ([place]) 16/05/9603  . C. difficile colitis 05/20/2017  . Hypokalemia 05/20/2017  . Nausea alone 09/20/2012  . Nonspecific abnormal results of liver function study 09/20/2012  . Elevated transaminase level   . Dyspepsia and other specified disorders of function of stomach 05/18/2011  . FATTY LIVER DISEASE 01/09/2010  . Hyperlipidemia 01/25/2009  . Depression 01/25/2009  . Essential hypertension 01/25/2009  . Coronary atherosclerosis 01/25/2009  . BACK PAIN 01/25/2009  . HLD (hyperlipidemia) 01/25/2009  . Chest pain 01/25/2009  . GERD 09/03/2008  . DYSPHAGIA 09/03/2008  . DIABETES MELLITUS, BORDERLINE 09/03/2008    Past Surgical History:  Procedure Laterality Date  . ABDOMINAL HYSTERECTOMY    . BYPASS GRAFT  08/2003   Quadruple   . CHOLECYSTECTOMY    . HERNIA REPAIR Right 01/2011  . LEFT HEART CATHETERIZATION WITH CORONARY/GRAFT ANGIOGRAM N/A 04/05/2012   Procedure: LEFT HEART CATHETERIZATION WITH Beatrix Fetters;  Surgeon: Sherren Mocha, MD;  Location: [facility] CATH LAB;  Service: Cardiovascular;  Laterality: N/A;    Current Outpatient Rx  . Order #: [id number] Class: Normal  . Order #: [id number] Class: No Print  . Order #: [id number] Class: Historical Med  . Order #: [id number] Class: Normal  . Order #: [id number] Class: Print  . Order #: [id number] Class: Historical Med  . Order #: [id number] Class: Historical  Med  . Order #: [id number] Class: Historical Med  . Order #: [id number] Class: Historical Med  . Order #: [id number] Class: Historical Med  . Order #: [id number] Class: Historical Med  . Order #: [id number] Class: No Print  . Order #: [id number] Class: Historical Med  . Order #: [id number] Class: Historical Med  . Order #: [id number] Class: Normal  . Order #: [id number] Class: Historical Med    Allergies Aspirin  Family History  Problem Relation Age of Onset  . Cancer Mother        stomach  . Diabetes Mother   . Heart attack  Brother   . Addison's disease Sister   . Lung cancer Daughter        smoker  . Colon cancer Neg Hx     Social History Social History   Tobacco Use  . Smoking status: Never Smoker  . Smokeless tobacco: Never Used  Substance Use Topics  . Alcohol use: No  . Drug use: No    Review of Systems  All other systems negative except as documented in the HPI. All pertinent positives and negatives as reviewed in the HPI. ____________________________________________   PHYSICAL EXAM:  VITAL SIGNS: ED Triage Vitals  Enc Vitals Group     BP 01/31/18 1324 (!) 171/77     Pulse Rate 01/31/18 1324 77     Resp 01/31/18 1324 14     Temp 01/31/18 1324 99.3 F (37.4 C)     Temp Source 01/31/18 1324 Oral     SpO2 01/31/18 1324 99 %     Weight 01/31/18 1325 147 lb (66.7 kg)     Height 01/31/18 1325 5\' 3"  (1.6 m)    Constitutional: Alert and oriented. Well appearing and in no acute distress. Eyes: Conjunctivae are normal. PERRL. EOMI. Head: Atraumatic. Nose: No congestion/rhinnorhea. Mouth/Throat: Mucous membranes are moist.  Oropharynx non-erythematous. Neck: No stridor.  No meningeal signs.   Cardiovascular: Normal rate, regular rhythm. Good peripheral circulation. Grossly normal heart sounds.   Respiratory: Normal respiratory effort.  No retractions. Lungs CTAB. Gastrointestinal: Mild distention with diffuse ttp but no rebound buarding or other e/o peritonitis. Musculoskeletal: No lower extremity tenderness nor edema. No gross deformities of extremities. Neurologic:  Normal speech and language. No gross focal neurologic deficits are appreciated.  Skin:  Skin is warm, dry and intact. No rash noted.  ____________________________________________   LABS (all labs ordered are listed, but only abnormal results are displayed)  Labs Reviewed  COMPREHENSIVE METABOLIC PANEL - Abnormal; Notable for the following components:      Result Value   Chloride 97 (*)    Glucose, Bld 375 (*)     Total Protein 8.8 (*)    Albumin 5.1 (*)    Alkaline Phosphatase 136 (*)    Total Bilirubin 1.6 (*)    All other components within normal limits  CBC - Abnormal; Notable for the following components:   Hemoglobin 15.6 (*)    MCHC 36.3 (*)    All other components within normal limits  URINALYSIS, ROUTINE W REFLEX MICROSCOPIC - Abnormal; Notable for the following components:   Glucose, UA >=500 (*)    Bacteria, UA RARE (*)    All other components within normal limits  C DIFFICILE QUICK SCREEN W PCR REFLEX  LIPASE, BLOOD   ____________________________________________   RADIOLOGY  Ct Abdomen Pelvis W Contrast  Result Date: 01/31/2018 CLINICAL DATA:  Acute abdominal pain constipation with nausea, vomiting and diarrhea EXAM: CT ABDOMEN AND PELVIS WITH CONTRAST TECHNIQUE: Multidetector CT imaging of  the abdomen and pelvis was performed using the standard protocol following bolus administration of intravenous contrast. CONTRAST:  16mL ISOVUE-300 IOPAMIDOL (ISOVUE-300) INJECTION 61% COMPARISON:  12/16/2017 FINDINGS: Lower chest: Mild peripheral subpleural basilar fibrotic pattern. No focal lower lobe pneumonia. Previous median sternotomy noted. Normal heart size. No pericardial pleural effusion. Small hiatal hernia evident. Atherosclerosis of the lower thoracic aorta. Hepatobiliary: Diffuse hypoattenuation of the liver compatible with hepatic steatosis. No focal hepatic abnormality or ductal dilatation. Remote cholecystectomy. Common bile duct nondilated. Pancreas: Unremarkable. No pancreatic ductal dilatation or surrounding inflammatory changes. Spleen: Normal in size without focal abnormality. Adrenals/Urinary Tract: Normal adrenal glands. Small subcentimeter renal cortical cysts bilaterally. No renal obstruction or hydronephrosis. No obstructing urinary tract or ureteral calculus. Ureters are symmetric and decompressed. Bladder unremarkable. No UVJ abnormality. Stomach/Bowel: Small hiatal hernia.  Stomach is underdistended accounting for wall prominence. No significant obstruction pattern, dilatation, ileus, or free air. No fluid collection or abscess. Normal appendix demonstrated in the right hemipelvis adjacent to the iliac vessels. Vascular/Lymphatic: Aortoiliac atherosclerosis without occlusive process. Iliac vessels remain patent. No aneurysm or dissection. Mesenteric and renal vasculature remain patent. No adenopathy. Reproductive: Remote hysterectomy. No adnexal pelvic mass. No pelvic free fluid or fluid collection. Other: Postop changes from ventral hernia repair. Abdominal wall mesh noted. No recurrent hernia. No inguinal abnormality. Negative for ascites or free fluid. Musculoskeletal: Degenerative changes noted spine. Mild SI joint arthropathy bilaterally. Pelvis intact. No acute osseous finding. IMPRESSION: No acute intra-abdominal or pelvic finding by CT. Small hiatal hernia Remote cholecystectomy and hysterectomy Hepatic steatosis Normal appendix No fluid collection, abscess or acute inflammatory process. Electronically Signed   By: Jerilynn Mages.  Shick M.D.   On: 01/31/2018 16:08    ____________________________________________  INITIAL IMPRESSION / ASSESSMENT AND PLAN / ED COURSE  eval for recurrent c diff vs colitis vs obstruction.  Patient without any episodes of diarrhea or bowel movement while in the emergency room for 4 and half hours.  CT okay.  Labs okay.  We will send her home with a specimen cup to take sample to PCP.  Will follow with her primary doctor for further evaluation     Pertinent labs & imaging results that were available during my care of the patient were reviewed by me and considered in my medical decision making (see chart for details).  ____________________________________________  FINAL CLINICAL IMPRESSION(S) / ED DIAGNOSES  Final diagnoses:  Diarrhea, unspecified type  Generalized abdominal pain     MEDICATIONS GIVEN DURING THIS VISIT:  Medications    fentaNYL (SUBLIMAZE) injection 50 mcg (50 mcg Intravenous Given 01/31/18 1530)  iopamidol (ISOVUE-300) 61 % injection 100 mL (100 mLs Intravenous Contrast Given 01/31/18 1543)     NEW OUTPATIENT MEDICATIONS STARTED DURING THIS VISIT:  New Prescriptions   No medications on file    Note:  This note was prepared with assistance of Dragon voice recognition software. Occasional wrong-word or sound-a-like substitutions may have occurred due to the inherent limitations of voice recognition software.   Merrily Pew, MD 01/31/18 1820

## 2018-01-31 NOTE — ED Notes (Signed)
Pt returned from CT °

## 2018-01-31 NOTE — ED Triage Notes (Signed)
Pt c/o abd pain and constipation X3 weeks. Sunday abd pain increased and now having N/V/D/. Last emesis was yesterday, has had 4 episodes of D/ with dark color. States it feels similar to when she was dx with c.diff last year, denies recent antibx.

## 2018-02-17 IMAGING — US US ABDOMEN COMPLETE
1 series · 13 of 25 positions shown · non-contrast
Comparison: Abdominal and pelvic CT scan December 05, 2015 and
abdominal ultrasound December 10, 2010.

CLINICAL DATA: Six months of right flank pain associated with
hematuria, history of renal cysts. Previous cholecystectomy. History
of diabetes.

EXAM:
ABDOMEN ULTRASOUND COMPLETE

[Series 1: us abdomen complete · 0.24mm/px · 13 of 83 slices shown]
[im 1/83]
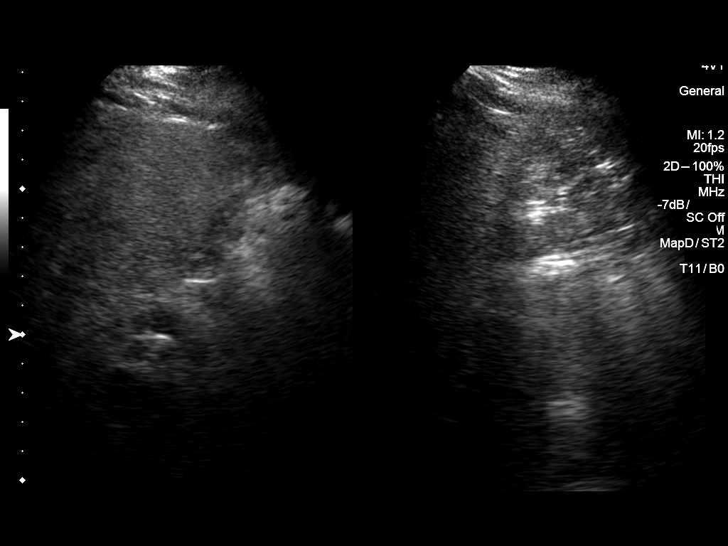
[im 7/83]
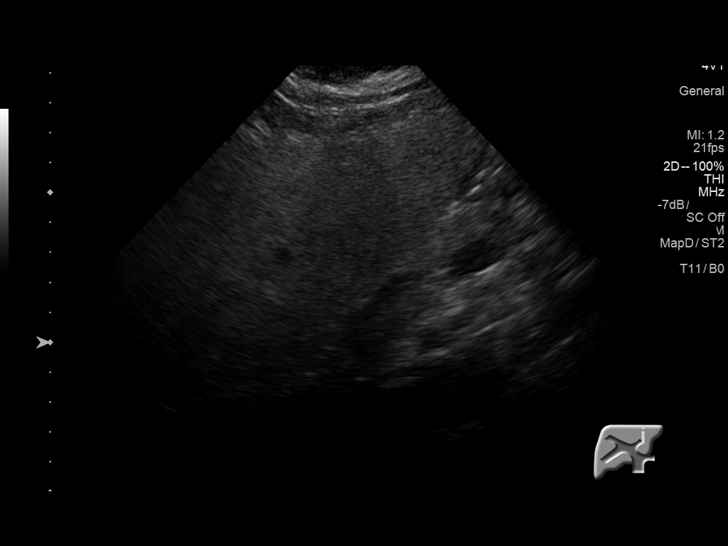
[im 14/83]
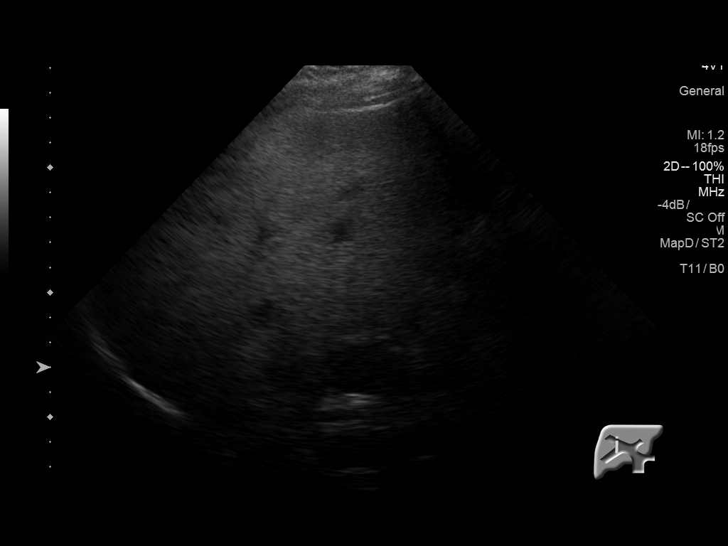
[im 21/83]
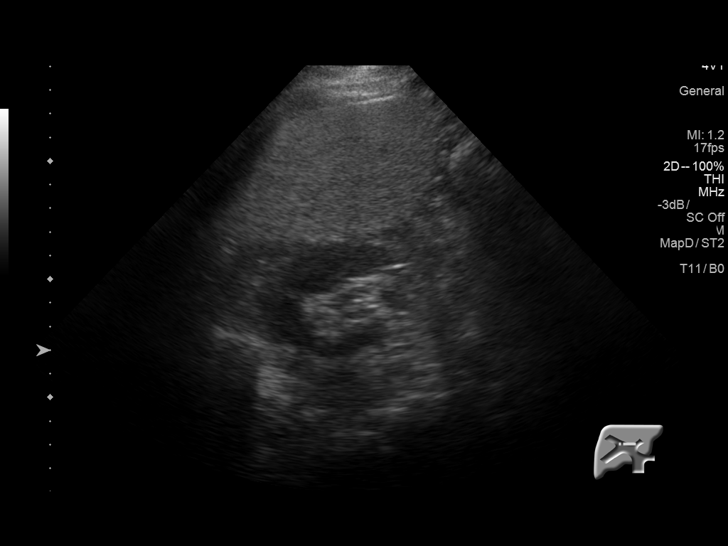
[im 28/83]
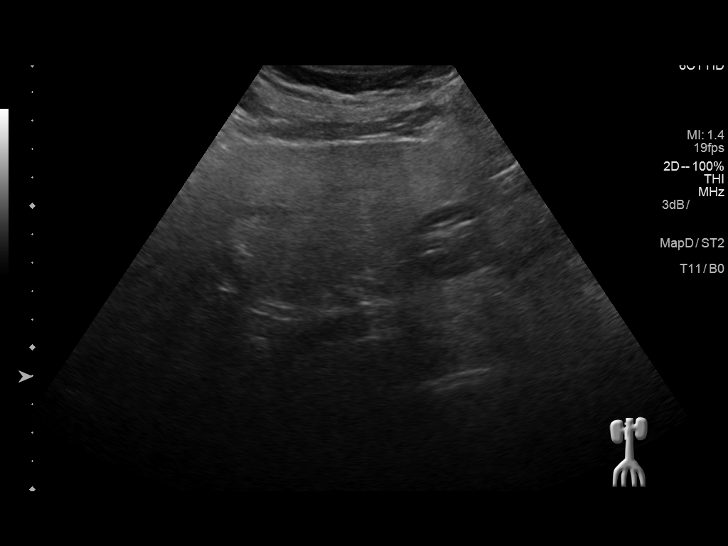
[im 35/83]
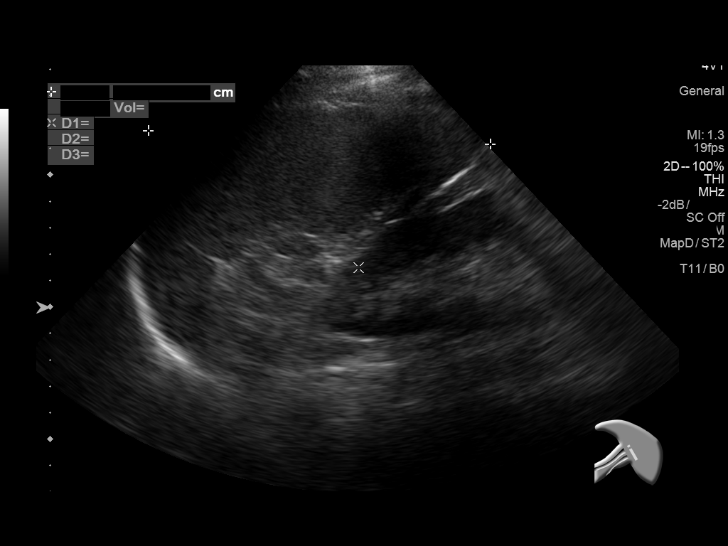
[im 42/83]
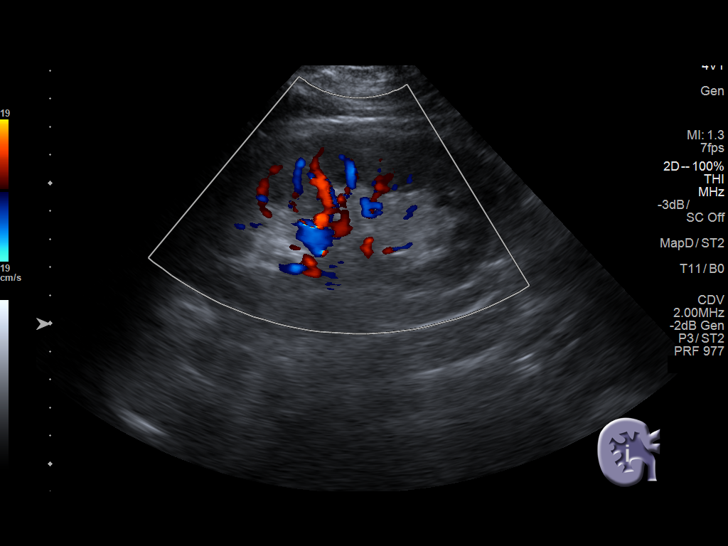
[im 48/83]
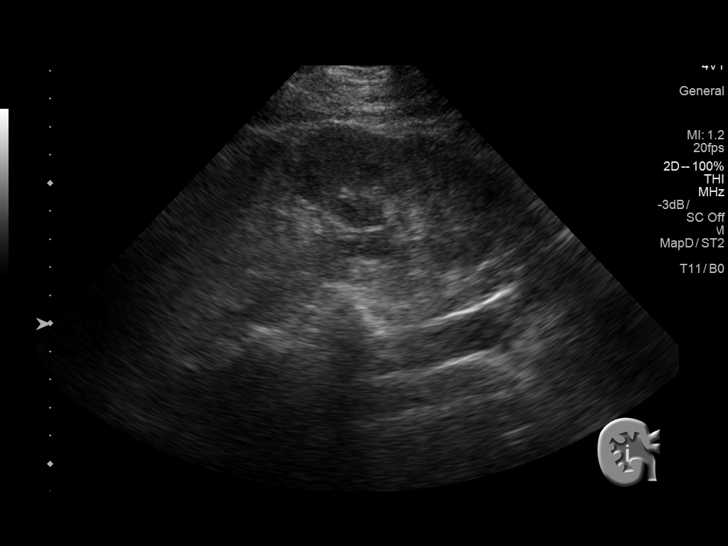
[im 55/83]
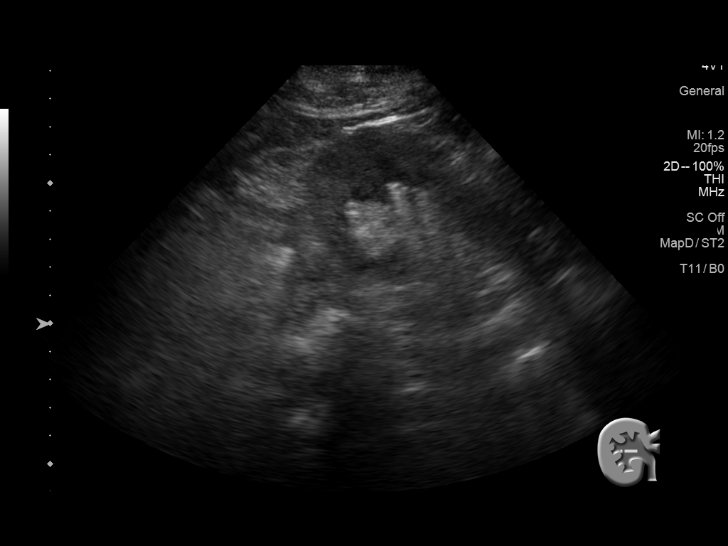
[im 62/83]
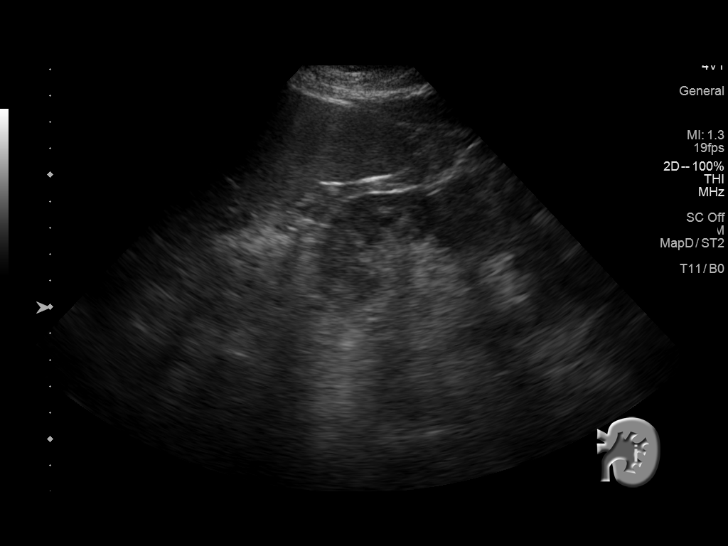
[im 69/83]
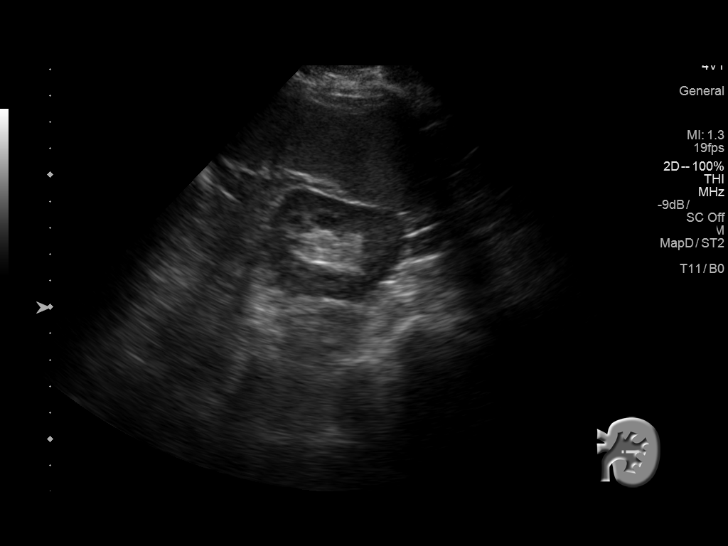
[im 76/83]
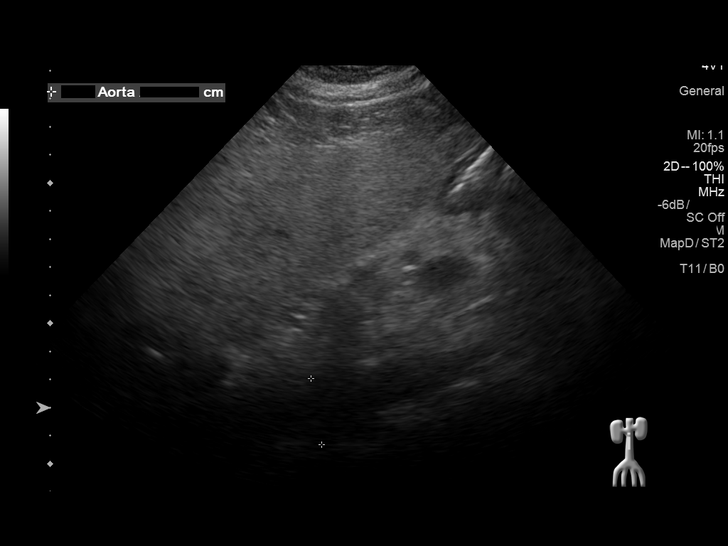
[im 83/83]
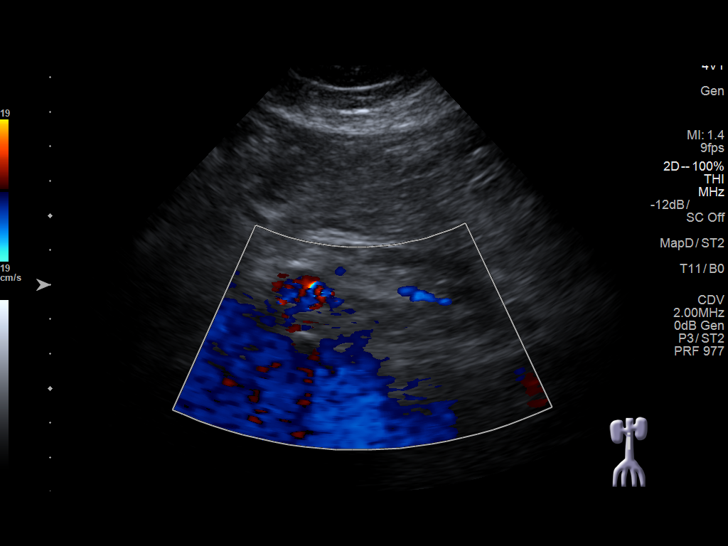

[13 of 25 positions shown; findings below may reference images not displayed]

FINDINGS: Gallbladder: The gallbladder is surgically absent.

Common bile duct: Diameter: 6 mm

Liver: The liver is subjectively enlarged. The hepatic echotexture
is mildly increased diffusely. There is no focal mass nor ductal
dilation.

IVC: No abnormality visualized.

Pancreas: Visualized portion unremarkable.

Spleen: The spleen is top-normal in size at 13 cm in length.

Right Kidney: Length: 12.3 cm. Echogenicity within normal limits. No
mass or hydronephrosis visualized. No echogenic shadowing stones are
observed.

Left Kidney: Length: 12.7 cm. Echogenicity within normal limits. No
mass or hydronephrosis visualized. No echogenic shadowing stones are
observed.

Abdominal aorta: No aneurysm visualized.

Other findings: There is no ascites.
IMPRESSION: 1. Subjective hepatomegaly with increased hepatic echotexture
consistent with fatty infiltrative change. The gallbladder is
surgically absent.
2. Top normal splenic size at 13 cm. This reflects a change since
the 0630 study when the splenic length was reported as 7.2 cm.
3. No acute abnormality observed elsewhere within the abdomen.

## 2018-03-02 DIAGNOSIS — B351 Tinea unguium: Secondary | ICD-10-CM | POA: Diagnosis not present

## 2018-03-03 DIAGNOSIS — K760 Fatty (change of) liver, not elsewhere classified: Secondary | ICD-10-CM | POA: Diagnosis not present

## 2018-03-03 DIAGNOSIS — E1165 Type 2 diabetes mellitus with hyperglycemia: Secondary | ICD-10-CM | POA: Diagnosis not present

## 2018-03-03 DIAGNOSIS — I5032 Chronic diastolic (congestive) heart failure: Secondary | ICD-10-CM | POA: Diagnosis not present

## 2018-03-03 DIAGNOSIS — E782 Mixed hyperlipidemia: Secondary | ICD-10-CM | POA: Diagnosis not present

## 2018-03-03 DIAGNOSIS — N183 Chronic kidney disease, stage 3 (moderate): Secondary | ICD-10-CM | POA: Diagnosis not present

## 2018-03-03 DIAGNOSIS — E876 Hypokalemia: Secondary | ICD-10-CM | POA: Diagnosis not present

## 2018-03-03 DIAGNOSIS — I10 Essential (primary) hypertension: Secondary | ICD-10-CM | POA: Diagnosis not present

## 2018-03-07 DIAGNOSIS — Z6824 Body mass index (BMI) 24.0-24.9, adult: Secondary | ICD-10-CM | POA: Diagnosis not present

## 2018-03-07 DIAGNOSIS — E782 Mixed hyperlipidemia: Secondary | ICD-10-CM | POA: Diagnosis not present

## 2018-03-07 DIAGNOSIS — E1165 Type 2 diabetes mellitus with hyperglycemia: Secondary | ICD-10-CM | POA: Diagnosis not present

## 2018-03-07 DIAGNOSIS — E039 Hypothyroidism, unspecified: Secondary | ICD-10-CM | POA: Diagnosis not present

## 2018-03-07 DIAGNOSIS — K760 Fatty (change of) liver, not elsewhere classified: Secondary | ICD-10-CM | POA: Diagnosis not present

## 2018-03-07 DIAGNOSIS — I10 Essential (primary) hypertension: Secondary | ICD-10-CM | POA: Diagnosis not present

## 2018-03-07 DIAGNOSIS — F3289 Other specified depressive episodes: Secondary | ICD-10-CM | POA: Diagnosis not present

## 2018-03-07 DIAGNOSIS — I5032 Chronic diastolic (congestive) heart failure: Secondary | ICD-10-CM | POA: Diagnosis not present

## 2018-05-31 DIAGNOSIS — I10 Essential (primary) hypertension: Secondary | ICD-10-CM | POA: Diagnosis not present

## 2018-05-31 DIAGNOSIS — F411 Generalized anxiety disorder: Secondary | ICD-10-CM | POA: Diagnosis not present

## 2018-05-31 DIAGNOSIS — E1165 Type 2 diabetes mellitus with hyperglycemia: Secondary | ICD-10-CM | POA: Diagnosis not present

## 2018-05-31 DIAGNOSIS — E039 Hypothyroidism, unspecified: Secondary | ICD-10-CM | POA: Diagnosis not present

## 2018-05-31 DIAGNOSIS — I5032 Chronic diastolic (congestive) heart failure: Secondary | ICD-10-CM | POA: Diagnosis not present

## 2018-05-31 DIAGNOSIS — E782 Mixed hyperlipidemia: Secondary | ICD-10-CM | POA: Diagnosis not present

## 2018-05-31 DIAGNOSIS — N183 Chronic kidney disease, stage 3 (moderate): Secondary | ICD-10-CM | POA: Diagnosis not present

## 2018-05-31 DIAGNOSIS — F3289 Other specified depressive episodes: Secondary | ICD-10-CM | POA: Diagnosis not present

## 2018-05-31 DIAGNOSIS — K760 Fatty (change of) liver, not elsewhere classified: Secondary | ICD-10-CM | POA: Diagnosis not present

## 2018-05-31 DIAGNOSIS — E876 Hypokalemia: Secondary | ICD-10-CM | POA: Diagnosis not present

## 2018-06-07 DIAGNOSIS — E782 Mixed hyperlipidemia: Secondary | ICD-10-CM | POA: Diagnosis not present

## 2018-06-07 DIAGNOSIS — M545 Low back pain: Secondary | ICD-10-CM | POA: Diagnosis not present

## 2018-06-07 DIAGNOSIS — K760 Fatty (change of) liver, not elsewhere classified: Secondary | ICD-10-CM | POA: Diagnosis not present

## 2018-06-07 DIAGNOSIS — I10 Essential (primary) hypertension: Secondary | ICD-10-CM | POA: Diagnosis not present

## 2018-06-07 DIAGNOSIS — E1165 Type 2 diabetes mellitus with hyperglycemia: Secondary | ICD-10-CM | POA: Diagnosis not present

## 2018-06-07 DIAGNOSIS — Z6824 Body mass index (BMI) 24.0-24.9, adult: Secondary | ICD-10-CM | POA: Diagnosis not present

## 2018-06-07 DIAGNOSIS — E039 Hypothyroidism, unspecified: Secondary | ICD-10-CM | POA: Diagnosis not present

## 2018-06-07 DIAGNOSIS — Z23 Encounter for immunization: Secondary | ICD-10-CM | POA: Diagnosis not present

## 2018-07-17 DIAGNOSIS — Z6825 Body mass index (BMI) 25.0-25.9, adult: Secondary | ICD-10-CM | POA: Diagnosis not present

## 2018-07-17 DIAGNOSIS — L03311 Cellulitis of abdominal wall: Secondary | ICD-10-CM | POA: Diagnosis not present

## 2018-08-23 DIAGNOSIS — I10 Essential (primary) hypertension: Secondary | ICD-10-CM | POA: Diagnosis not present

## 2018-08-23 DIAGNOSIS — K760 Fatty (change of) liver, not elsewhere classified: Secondary | ICD-10-CM | POA: Diagnosis not present

## 2018-08-23 DIAGNOSIS — K219 Gastro-esophageal reflux disease without esophagitis: Secondary | ICD-10-CM | POA: Diagnosis not present

## 2018-08-23 DIAGNOSIS — N183 Chronic kidney disease, stage 3 (moderate): Secondary | ICD-10-CM | POA: Diagnosis not present

## 2018-08-23 DIAGNOSIS — E782 Mixed hyperlipidemia: Secondary | ICD-10-CM | POA: Diagnosis not present

## 2018-08-23 DIAGNOSIS — I5032 Chronic diastolic (congestive) heart failure: Secondary | ICD-10-CM | POA: Diagnosis not present

## 2018-08-23 DIAGNOSIS — E1165 Type 2 diabetes mellitus with hyperglycemia: Secondary | ICD-10-CM | POA: Diagnosis not present

## 2018-08-28 DIAGNOSIS — Z6825 Body mass index (BMI) 25.0-25.9, adult: Secondary | ICD-10-CM | POA: Diagnosis not present

## 2018-08-28 DIAGNOSIS — K219 Gastro-esophageal reflux disease without esophagitis: Secondary | ICD-10-CM | POA: Diagnosis not present

## 2018-08-28 DIAGNOSIS — E039 Hypothyroidism, unspecified: Secondary | ICD-10-CM | POA: Diagnosis not present

## 2018-08-28 DIAGNOSIS — K760 Fatty (change of) liver, not elsewhere classified: Secondary | ICD-10-CM | POA: Diagnosis not present

## 2018-08-28 DIAGNOSIS — E782 Mixed hyperlipidemia: Secondary | ICD-10-CM | POA: Diagnosis not present

## 2018-08-28 DIAGNOSIS — E1165 Type 2 diabetes mellitus with hyperglycemia: Secondary | ICD-10-CM | POA: Diagnosis not present

## 2018-08-28 DIAGNOSIS — I10 Essential (primary) hypertension: Secondary | ICD-10-CM | POA: Diagnosis not present

## 2018-08-28 DIAGNOSIS — M545 Low back pain: Secondary | ICD-10-CM | POA: Diagnosis not present

## 2018-11-29 DIAGNOSIS — R300 Dysuria: Secondary | ICD-10-CM | POA: Diagnosis not present

## 2018-11-29 DIAGNOSIS — D235 Other benign neoplasm of skin of trunk: Secondary | ICD-10-CM | POA: Diagnosis not present

## 2018-11-29 DIAGNOSIS — Z6825 Body mass index (BMI) 25.0-25.9, adult: Secondary | ICD-10-CM | POA: Diagnosis not present

## 2018-11-30 DIAGNOSIS — L280 Lichen simplex chronicus: Secondary | ICD-10-CM | POA: Diagnosis not present

## 2018-11-30 DIAGNOSIS — D485 Neoplasm of uncertain behavior of skin: Secondary | ICD-10-CM | POA: Diagnosis not present

## 2018-12-11 DIAGNOSIS — Z6824 Body mass index (BMI) 24.0-24.9, adult: Secondary | ICD-10-CM | POA: Diagnosis not present

## 2018-12-11 DIAGNOSIS — Z0001 Encounter for general adult medical examination with abnormal findings: Secondary | ICD-10-CM | POA: Diagnosis not present

## 2018-12-11 DIAGNOSIS — I10 Essential (primary) hypertension: Secondary | ICD-10-CM | POA: Diagnosis not present

## 2019-03-19 ENCOUNTER — Telehealth: Payer: Self-pay | Admitting: Cardiology

## 2019-03-19 NOTE — Telephone Encounter (Signed)
Recall - no vm, phone just rang

## 2019-04-23 DIAGNOSIS — L281 Prurigo nodularis: Secondary | ICD-10-CM | POA: Diagnosis not present

## 2019-04-23 DIAGNOSIS — L0100 Impetigo, unspecified: Secondary | ICD-10-CM | POA: Diagnosis not present

## 2019-05-07 DIAGNOSIS — L281 Prurigo nodularis: Secondary | ICD-10-CM | POA: Diagnosis not present

## 2019-06-01 DIAGNOSIS — Z23 Encounter for immunization: Secondary | ICD-10-CM | POA: Diagnosis not present

## 2019-08-29 DIAGNOSIS — E782 Mixed hyperlipidemia: Secondary | ICD-10-CM | POA: Diagnosis not present

## 2019-08-29 DIAGNOSIS — E039 Hypothyroidism, unspecified: Secondary | ICD-10-CM | POA: Diagnosis not present

## 2019-08-29 DIAGNOSIS — K760 Fatty (change of) liver, not elsewhere classified: Secondary | ICD-10-CM | POA: Diagnosis not present

## 2019-08-29 DIAGNOSIS — E1165 Type 2 diabetes mellitus with hyperglycemia: Secondary | ICD-10-CM | POA: Diagnosis not present

## 2019-08-29 DIAGNOSIS — K219 Gastro-esophageal reflux disease without esophagitis: Secondary | ICD-10-CM | POA: Diagnosis not present

## 2019-09-04 DIAGNOSIS — E782 Mixed hyperlipidemia: Secondary | ICD-10-CM | POA: Diagnosis not present

## 2019-09-04 DIAGNOSIS — Z23 Encounter for immunization: Secondary | ICD-10-CM | POA: Diagnosis not present

## 2019-09-04 DIAGNOSIS — E876 Hypokalemia: Secondary | ICD-10-CM | POA: Diagnosis not present

## 2019-09-04 DIAGNOSIS — E1165 Type 2 diabetes mellitus with hyperglycemia: Secondary | ICD-10-CM | POA: Diagnosis not present

## 2019-09-04 DIAGNOSIS — K760 Fatty (change of) liver, not elsewhere classified: Secondary | ICD-10-CM | POA: Diagnosis not present

## 2019-09-04 DIAGNOSIS — I10 Essential (primary) hypertension: Secondary | ICD-10-CM | POA: Diagnosis not present

## 2019-11-02 ENCOUNTER — Encounter (HOSPITAL_COMMUNITY): Payer: Self-pay

## 2019-11-02 ENCOUNTER — Other Ambulatory Visit: Payer: Self-pay

## 2019-11-02 ENCOUNTER — Inpatient Hospital Stay (HOSPITAL_COMMUNITY)
Admission: EM | Admit: 2019-11-02 | Discharge: 2019-11-07 | DRG: 247 | Disposition: A | Discharge status: Home / Self Care | Payer: Medicare Other | Attending: Cardiovascular Disease | Admitting: Cardiovascular Disease

## 2019-11-02 ENCOUNTER — Inpatient Hospital Stay (HOSPITAL_COMMUNITY)
Admission: EM | Disposition: A | Discharge status: Home / Self Care | Payer: Self-pay | Source: Home / Self Care | Attending: Cardiovascular Disease

## 2019-11-02 ENCOUNTER — Emergency Department (HOSPITAL_COMMUNITY): Payer: Medicare Other

## 2019-11-02 DIAGNOSIS — Z20822 Contact with and (suspected) exposure to covid-19: Secondary | ICD-10-CM | POA: Diagnosis not present

## 2019-11-02 DIAGNOSIS — I10 Essential (primary) hypertension: Secondary | ICD-10-CM | POA: Diagnosis present

## 2019-11-02 DIAGNOSIS — I214 Non-ST elevation (NSTEMI) myocardial infarction: Secondary | ICD-10-CM | POA: Diagnosis not present

## 2019-11-02 DIAGNOSIS — Z792 Long term (current) use of antibiotics: Secondary | ICD-10-CM | POA: Diagnosis not present

## 2019-11-02 DIAGNOSIS — E785 Hyperlipidemia, unspecified: Secondary | ICD-10-CM | POA: Diagnosis present

## 2019-11-02 DIAGNOSIS — E1165 Type 2 diabetes mellitus with hyperglycemia: Secondary | ICD-10-CM | POA: Diagnosis not present

## 2019-11-02 DIAGNOSIS — Z9049 Acquired absence of other specified parts of digestive tract: Secondary | ICD-10-CM | POA: Diagnosis not present

## 2019-11-02 DIAGNOSIS — F329 Major depressive disorder, single episode, unspecified: Secondary | ICD-10-CM | POA: Diagnosis present

## 2019-11-02 DIAGNOSIS — Z90710 Acquired absence of both cervix and uterus: Secondary | ICD-10-CM

## 2019-11-02 DIAGNOSIS — Z8249 Family history of ischemic heart disease and other diseases of the circulatory system: Secondary | ICD-10-CM

## 2019-11-02 DIAGNOSIS — Z955 Presence of coronary angioplasty implant and graft: Secondary | ICD-10-CM

## 2019-11-02 DIAGNOSIS — Z833 Family history of diabetes mellitus: Secondary | ICD-10-CM

## 2019-11-02 DIAGNOSIS — I25810 Atherosclerosis of coronary artery bypass graft(s) without angina pectoris: Secondary | ICD-10-CM | POA: Diagnosis not present

## 2019-11-02 DIAGNOSIS — E118 Type 2 diabetes mellitus with unspecified complications: Secondary | ICD-10-CM | POA: Diagnosis not present

## 2019-11-02 DIAGNOSIS — Z79899 Other long term (current) drug therapy: Secondary | ICD-10-CM

## 2019-11-02 DIAGNOSIS — Z03818 Encounter for observation for suspected exposure to other biological agents ruled out: Secondary | ICD-10-CM | POA: Diagnosis not present

## 2019-11-02 DIAGNOSIS — Z79891 Long term (current) use of opiate analgesic: Secondary | ICD-10-CM | POA: Diagnosis not present

## 2019-11-02 DIAGNOSIS — Z951 Presence of aortocoronary bypass graft: Secondary | ICD-10-CM

## 2019-11-02 DIAGNOSIS — K219 Gastro-esophageal reflux disease without esophagitis: Secondary | ICD-10-CM | POA: Diagnosis not present

## 2019-11-02 DIAGNOSIS — E782 Mixed hyperlipidemia: Secondary | ICD-10-CM | POA: Diagnosis present

## 2019-11-02 DIAGNOSIS — R7309 Other abnormal glucose: Secondary | ICD-10-CM | POA: Diagnosis present

## 2019-11-02 DIAGNOSIS — I2510 Atherosclerotic heart disease of native coronary artery without angina pectoris: Secondary | ICD-10-CM | POA: Diagnosis present

## 2019-11-02 DIAGNOSIS — I447 Left bundle-branch block, unspecified: Secondary | ICD-10-CM | POA: Diagnosis present

## 2019-11-02 DIAGNOSIS — I351 Nonrheumatic aortic (valve) insufficiency: Secondary | ICD-10-CM | POA: Diagnosis not present

## 2019-11-02 DIAGNOSIS — R079 Chest pain, unspecified: Secondary | ICD-10-CM | POA: Diagnosis not present

## 2019-11-02 HISTORY — PX: LEFT HEART CATH AND CORS/GRAFTS ANGIOGRAPHY: CATH118250

## 2019-11-02 LAB — CBC
HCT: 38 % (ref 36.0–46.0)
Hemoglobin: 13.3 g/dL (ref 12.0–15.0)
MCH: 31.8 pg (ref 26.0–34.0)
MCHC: 35 g/dL (ref 30.0–36.0)
MCV: 90.9 fL (ref 80.0–100.0)
Platelets: 299 10*3/uL (ref 150–400)
RBC: 4.18 MIL/uL (ref 3.87–5.11)
RDW: 12.4 % (ref 11.5–15.5)
WBC: 5 10*3/uL (ref 4.0–10.5)
nRBC: 0 % (ref 0.0–0.2)

## 2019-11-02 LAB — TROPONIN I (HIGH SENSITIVITY)
Troponin I (High Sensitivity): 1701 ng/L (ref <18)
Troponin I (High Sensitivity): 8299 ng/L (ref <18)

## 2019-11-02 LAB — RESPIRATORY PANEL BY RT PCR (FLU A&B, COVID)
Influenza A by PCR: NEGATIVE
Influenza B by PCR: NEGATIVE
SARS Coronavirus 2 by RT PCR: NEGATIVE

## 2019-11-02 LAB — BASIC METABOLIC PANEL
Anion gap: 14 (ref 5–15)
BUN: 16 mg/dL (ref 8–23)
CO2: 24 mmol/L (ref 22–32)
Calcium: 10 mg/dL (ref 8.9–10.3)
Chloride: 97 mmol/L — ABNORMAL LOW (ref 98–111)
Creatinine, Ser: 0.77 mg/dL (ref 0.44–1.00)
GFR calc Af Amer: >60 mL/min (ref >60)
GFR calc non Af Amer: >60 mL/min (ref >60)
Glucose, Bld: 336 mg/dL — ABNORMAL HIGH (ref 70–99)
Potassium: 3.9 mmol/L (ref 3.5–5.1)
Sodium: 135 mmol/L (ref 135–145)

## 2019-11-02 LAB — MRSA PCR SCREENING: MRSA by PCR: NEGATIVE

## 2019-11-02 SURGERY — LEFT HEART CATH AND CORS/GRAFTS ANGIOGRAPHY
Anesthesia: LOCAL

## 2019-11-02 MED ORDER — LIDOCAINE HCL (PF) 1 % IJ SOLN
INTRAMUSCULAR | Status: DC | PRN
  Administered 2019-11-02: 2 mL
  Administered 2019-11-02: 15 mL

## 2019-11-02 MED ORDER — AMLODIPINE BESYLATE 5 MG PO TABS: 5.0000 mg | ORAL_TABLET | Freq: Every day | ORAL | Status: DC

## 2019-11-02 MED ORDER — ACETAMINOPHEN 325 MG PO TABS: 650.0000 mg | ORAL_TABLET | ORAL | Status: DC | PRN

## 2019-11-02 MED ORDER — NITROGLYCERIN IN D5W 200-5 MCG/ML-% IV SOLN
0.0000 ug/min | INTRAVENOUS | Status: DC
  Administered 2019-11-02: 19:00:00 10 ug/min via INTRAVENOUS

## 2019-11-02 MED ORDER — PAROXETINE HCL 20 MG PO TABS
40.0000 mg | ORAL_TABLET | Freq: Every morning | ORAL | Status: DC
  Administered 2019-11-03 – 2019-11-07 (×5): 40 mg via ORAL
  Filled 2019-11-02 (×5): qty 2

## 2019-11-02 MED ORDER — ONDANSETRON HCL 4 MG/2ML IJ SOLN: 4.0000 mg | Freq: Four times a day (QID) | INTRAMUSCULAR | Status: DC | PRN

## 2019-11-02 MED ORDER — MIDAZOLAM HCL 2 MG/2ML IJ SOLN
INTRAMUSCULAR | Status: DC | PRN
  Administered 2019-11-02: 1 mg via INTRAVENOUS

## 2019-11-02 MED ORDER — HEPARIN (PORCINE) IN NACL 1000-0.9 UT/500ML-% IV SOLN
INTRAVENOUS | Status: AC
  Filled 2019-11-02: qty 1000

## 2019-11-02 MED ORDER — GLIPIZIDE 10 MG PO TABS
10.0000 mg | ORAL_TABLET | Freq: Two times a day (BID) | ORAL | Status: DC
  Filled 2019-11-02: qty 1

## 2019-11-02 MED ORDER — SODIUM CHLORIDE 0.9% FLUSH
3.0000 mL | Freq: Two times a day (BID) | INTRAVENOUS | Status: DC
  Administered 2019-11-03 – 2019-11-06 (×6): 3 mL via INTRAVENOUS

## 2019-11-02 MED ORDER — HEPARIN (PORCINE) 25000 UT/250ML-% IV SOLN
1100.0000 [IU]/h | INTRAVENOUS | Status: DC
  Administered 2019-11-03: 03:00:00 1050 [IU]/h via INTRAVENOUS
  Filled 2019-11-02 (×3): qty 250

## 2019-11-02 MED ORDER — SODIUM CHLORIDE 0.9 % IV SOLN
INTRAVENOUS | Status: AC | PRN
  Administered 2019-11-02: 999 mL via INTRAVENOUS

## 2019-11-02 MED ORDER — ASPIRIN 81 MG PO CHEW: 324.0000 mg | CHEWABLE_TABLET | Freq: Once | ORAL | Status: DC

## 2019-11-02 MED ORDER — OXYCODONE HCL 5 MG PO TABS
5.0000 mg | ORAL_TABLET | ORAL | Status: DC | PRN
  Administered 2019-11-03 – 2019-11-04 (×2): 5 mg via ORAL
  Filled 2019-11-02: qty 2
  Filled 2019-11-02: qty 1

## 2019-11-02 MED ORDER — TICAGRELOR 90 MG PO TABS
ORAL_TABLET | ORAL | Status: AC
  Filled 2019-11-02: qty 2

## 2019-11-02 MED ORDER — FENTANYL CITRATE (PF) 100 MCG/2ML IJ SOLN
INTRAMUSCULAR | Status: DC | PRN
  Administered 2019-11-02: 25 ug via INTRAVENOUS

## 2019-11-02 MED ORDER — NITROGLYCERIN IN D5W 200-5 MCG/ML-% IV SOLN
INTRAVENOUS | Status: AC
  Filled 2019-11-02: qty 250

## 2019-11-02 MED ORDER — CLONIDINE HCL 0.2 MG PO TABS
0.2000 mg | ORAL_TABLET | Freq: Every day | ORAL | Status: DC
  Administered 2019-11-02 – 2019-11-06 (×5): 0.2 mg via ORAL
  Filled 2019-11-02 (×4): qty 1

## 2019-11-02 MED ORDER — VERAPAMIL HCL 2.5 MG/ML IV SOLN
INTRAVENOUS | Status: AC
  Filled 2019-11-02: qty 2

## 2019-11-02 MED ORDER — HEPARIN SODIUM (PORCINE) 1000 UNIT/ML IJ SOLN
4000.0000 [IU] | Freq: Once | INTRAMUSCULAR | Status: DC
  Filled 2019-11-02: qty 4

## 2019-11-02 MED ORDER — ATENOLOL 25 MG PO TABS
75.0000 mg | ORAL_TABLET | Freq: Every day | ORAL | Status: DC
  Administered 2019-11-03 – 2019-11-07 (×5): 75 mg via ORAL
  Filled 2019-11-02: qty 3
  Filled 2019-11-02: qty 1.5
  Filled 2019-11-02 (×2): qty 3
  Filled 2019-11-02: qty 1.5
  Filled 2019-11-02 (×2): qty 3
  Filled 2019-11-02: qty 1.5

## 2019-11-02 MED ORDER — FENTANYL CITRATE (PF) 100 MCG/2ML IJ SOLN
INTRAMUSCULAR | Status: AC
  Filled 2019-11-02: qty 2

## 2019-11-02 MED ORDER — IOHEXOL 350 MG/ML SOLN
INTRAVENOUS | Status: DC | PRN
  Administered 2019-11-02: 70 mL

## 2019-11-02 MED ORDER — TICAGRELOR 90 MG PO TABS
90.0000 mg | ORAL_TABLET | Freq: Two times a day (BID) | ORAL | Status: DC
  Administered 2019-11-03 – 2019-11-05 (×5): 90 mg via ORAL
  Filled 2019-11-02 (×5): qty 1

## 2019-11-02 MED ORDER — HEPARIN SODIUM (PORCINE) 1000 UNIT/ML IJ SOLN
INTRAMUSCULAR | Status: DC | PRN
  Administered 2019-11-02: 5000 [IU] via INTRAVENOUS

## 2019-11-02 MED ORDER — SODIUM CHLORIDE 0.9% FLUSH: 3.0000 mL | INTRAVENOUS | Status: DC | PRN

## 2019-11-02 MED ORDER — SODIUM CHLORIDE 0.9 % IV SOLN: INTRAVENOUS | Status: AC

## 2019-11-02 MED ORDER — NITROGLYCERIN IN D5W 200-5 MCG/ML-% IV SOLN
INTRAVENOUS | Status: AC | PRN
  Administered 2019-11-02: 10 ug/min via INTRAVENOUS

## 2019-11-02 MED ORDER — IOHEXOL 350 MG/ML SOLN
INTRAVENOUS | Status: AC
  Filled 2019-11-02: qty 1

## 2019-11-02 MED ORDER — SODIUM CHLORIDE 0.9 % IV SOLN: 250.0000 mL | INTRAVENOUS | Status: DC | PRN

## 2019-11-02 MED ORDER — HEPARIN (PORCINE) IN NACL 1000-0.9 UT/500ML-% IV SOLN
INTRAVENOUS | Status: DC | PRN
  Administered 2019-11-02 (×2): 500 mL

## 2019-11-02 MED ORDER — HYDRALAZINE HCL 20 MG/ML IJ SOLN: 10.0000 mg | INTRAMUSCULAR | Status: AC | PRN

## 2019-11-02 MED ORDER — HEPARIN SODIUM (PORCINE) 1000 UNIT/ML IJ SOLN
INTRAMUSCULAR | Status: AC
  Filled 2019-11-02: qty 1

## 2019-11-02 MED ORDER — LABETALOL HCL 5 MG/ML IV SOLN: 10.0000 mg | INTRAVENOUS | Status: AC | PRN

## 2019-11-02 MED ORDER — TICAGRELOR 90 MG PO TABS
ORAL_TABLET | ORAL | Status: DC | PRN
  Administered 2019-11-02: 180 mg via ORAL

## 2019-11-02 MED ORDER — FUROSEMIDE 40 MG PO TABS
40.0000 mg | ORAL_TABLET | Freq: Every day | ORAL | Status: DC
  Administered 2019-11-03 – 2019-11-07 (×5): 40 mg via ORAL
  Filled 2019-11-02: qty 2
  Filled 2019-11-02 (×2): qty 1
  Filled 2019-11-02 (×2): qty 2

## 2019-11-02 MED ORDER — LIDOCAINE HCL (PF) 1 % IJ SOLN
INTRAMUSCULAR | Status: AC
  Filled 2019-11-02: qty 30

## 2019-11-02 MED ORDER — MIDAZOLAM HCL 2 MG/2ML IJ SOLN
INTRAMUSCULAR | Status: AC
  Filled 2019-11-02: qty 2

## 2019-11-02 MED ORDER — NITROGLYCERIN 0.4 MG SL SUBL: 0.4000 mg | SUBLINGUAL_TABLET | SUBLINGUAL | Status: DC | PRN

## 2019-11-02 SURGICAL SUPPLY — 11 items
CATH INFINITI 5FR MULTPACK ANG (CATHETERS) ×1 IMPLANT
CLOSURE MYNX CONTROL 6F/7F (Vascular Products) ×1 IMPLANT
GLIDESHEATH SLEND SS 6F .021 (SHEATH) ×1 IMPLANT
GUIDEWIRE INQWIRE 1.5J.035X260 (WIRE) IMPLANT
INQWIRE 1.5J .035X260CM (WIRE) ×2
KIT HEART LEFT (KITS) ×2 IMPLANT
PACK CARDIAC CATHETERIZATION (CUSTOM PROCEDURE TRAY) ×2 IMPLANT
SHEATH PINNACLE 6F 10CM (SHEATH) ×1 IMPLANT
SHEATH PROBE COVER 6X72 (BAG) ×1 IMPLANT
TRANSDUCER W/STOPCOCK (MISCELLANEOUS) ×2 IMPLANT
TUBING CIL FLEX 10 FLL-RA (TUBING) ×2 IMPLANT

## 2019-11-02 NOTE — Progress Notes (Signed)
ANTICOAGULATION CONSULT NOTE - Initial Consult  Pharmacy Consult for Heparin Indication: chest pain/ACS,   Allergies  Allergen Reactions  . Aspirin Nausea Only    Patient Measurements: Height: 5\' 3"  (160 cm) Weight: 147 lb (66.7 kg) IBW/kg (Calculated) : 52.4 Heparin Dosing Weight:66 kg  Vital Signs: Temp: 98.4 F (36.9 C) (03/26 1522) Temp Source: Oral (03/26 1522) BP: 127/75 (03/26 1830) Pulse Rate: 72 (03/26 1830)  Labs: Recent Labs    11/02/19 1535  HGB 13.3  HCT 38.0  PLT 299  CREATININE 0.77  TROPONINIHS 1,701*    Estimated Creatinine Clearance: 56.6 mL/min (by C-G formula based on SCr of 0.77 mg/dL).   Medical History: Past Medical History:  Diagnosis Date  . Acute myocardial infarction, unspecified site, episode of care unspecified   . Coronary artery disease status post CABG 2005    a. s/p CABG 2005. b. 2011 - DES of SVG to PDA. c. Admitted with CP 03/2012 with stable anatomy by cath, felt noncardiac pain.  . Depression   . Diabetes mellitus   . Elevated LFTs   . Elevated transaminase level    Most recently October 2012 most recent  . Esophageal reflux   . GERD (gastroesophageal reflux disease)   . Hernia   . History of cholecystectomy   . Hyperlipidemia    a. Not on statin due to elevated liver enzymes.  . Hypertension   . Kidney cysts    left   Assessment: CC/HPI: CP  PMH: CAD s/p CABG x 4 in 2005, PCI, DM, HLD, HTN, GERD, depression, elevated LFT's, hernia, L kidney cyst  Anticoag: ongoing chest pain with new LBBB and elevated troponin. Patent bypass grafts with flow on cath. Unsure of cause of CP. Will plan IV heparin tonight and then a chest CTA tomorrow to exclude PE. IF r/o PE, the needs PCI. - 3/26: 1825 femoral sheath out. Scr 0.77. CBC WNL  3/26 cath:Severe triple vessel CAD s/p 4V CABG with 4/4 patent bypass grafts. Unsure cause of CP. IF PE excluded, the lesions in her vein graft to the obtuse marginal branches will need to be  treated if there is no other cause for her chest pain and elevated troponin. Will also consider PCI of the native LAD  Goal of Therapy:  Heparin level 0.3-0.7 units/ml Monitor platelets by anticoagulation protocol: Yes   Plan:  Start IV heparin 8 hrs post-sheath removal at 0230 now for r/o PE At 1050 units/hr (16 unit/kg/hr) Check heparin level 6-8 hrs after heparin starts.  Daily HL and CBC F/u CT to r/o PE on 3/27.   Nadiyah Zeis S. Alford Highland, PharmD, BCPS Clinical Staff Pharmacist [email or web address] Alford Highland, [employer] 11/02/2019,7:35 PM

## 2019-11-02 NOTE — ED Triage Notes (Signed)
Pt reports chest pain since 10am, no other symptoms,. Pain 5/10 at this time. Pt a.o, nad noted

## 2019-11-02 NOTE — H&P (Signed)
Cardiology Admission History and Physical:   Patient ID: CHANEE [name] MRN: [id number]; DOB: Jul 27, 1946   Admission date: 11/02/2019  Primary Care Provider: Curlene Labrum, MD Primary Cardiologist: Stanford Breed Primary Electrophysiologist:  None   Chief Complaint:  Chest pain  Patient Profile:   [name] is a 74 y.o. female with history of CAD s/p CABG in 2005, PCI, DM, HLD, HTN, GERD presenting to the ED with c/o chest pain.   History of Present Illness:   [name] is a 74 yo female with history of CAD s/p CABG in 2005, PCI, DM, HLD, HTN, GERD presenting to the ED with c/o chest pain. She underwent 4 V CABG in 2005. Last cath in 2013 with chronic occlusion of all three native vessels with 4 patent bypass grafts (LIMA to LAD, SVG to PDA, SVG to OM and OM2). The vein graft to the PDA is previously stented. LVEF=55-65% by LV gram in 2013. She was last seen in our office by Dr. Stanford Breed in 2017.   She began having chest pain at 10 today. She took her heart burn medications with no relief. She presented to the ED. She is having continued chest pain with radiation through to her back. Her EKG is personally reviewed by me and shows sinus rhythm with new LBBB. Troponin 1701.   Past Medical History:  Diagnosis Date  . Acute myocardial infarction, unspecified site, episode of care unspecified   . Coronary artery disease status post CABG 2005    a. s/p CABG 2005. b. 2011 - DES of SVG to PDA. c. Admitted with CP 03/2012 with stable anatomy by cath, felt noncardiac pain.  . Depression   . Diabetes mellitus   . Elevated LFTs   . Elevated transaminase level    Most recently October 2012 most recent  . Esophageal reflux   . GERD (gastroesophageal reflux disease)   . Hernia   . History of cholecystectomy   . Hyperlipidemia    a. Not on statin due to elevated liver enzymes.  . Hypertension   . Kidney cysts    left    Past Surgical History:  Procedure Laterality Date  . ABDOMINAL  HYSTERECTOMY    . BYPASS GRAFT  08/2003   Quadruple   . CHOLECYSTECTOMY    . HERNIA REPAIR Right 01/2011  . LEFT HEART CATHETERIZATION WITH CORONARY/GRAFT ANGIOGRAM N/A 04/05/2012   Procedure: LEFT HEART CATHETERIZATION WITH Beatrix Fetters;  Surgeon: Sherren Mocha, MD;  Location: [facility] C[facility] LAB;  Service: Cardiovascular;  Laterality: N/A;     Medications Prior to Admission: Prior to Admission medications   Medication Sig Start Date End Date Taking? Authorizing Provider  amLODipine (NORVASC) 5 MG tablet Take 1 tablet (5 mg total) by mouth daily. 08/19/14   Lelon Perla, MD       Lelon Perla, MD  atenolol (TENORMIN) 50 MG tablet Take 75 mg by mouth daily.     [provider]  cloNIDine (CATAPRES) 0.2 MG tablet Take 1 tablet (0.2 mg total) by mouth at bedtime. 10/04/12   Lelon Perla, MD  famotidine (PEPCID) 20 MG tablet Take 1 tablet (20 mg total) by mouth 2 (two) times daily. 12/05/15   Sherwood Gambler, MD  furosemide (LASIX) 40 MG tablet Take 40 mg by mouth daily.      [provider]  gemfibrozil (LOPID) 600 MG tablet Take 600 mg by mouth 2 (two) times daily. 07/23/14   [provider]  glipiZIDE (GLUCOTROL) 10  MG tablet Take 10 mg by mouth 2 (two) times daily. 07/01/14   [provider]  HYDROcodone-acetaminophen (NORCO/VICODIN) 5-325 MG per tablet Take 1 tablet by mouth every 6 (six) hours as needed for moderate pain.    [provider]  losartan (COZAAR) 100 MG tablet Take 100 mg by mouth daily. 06/24/14   [provider]  neomycin-bacitracin-polymyxin (NEOSPORIN) [phone] ointment Apply 1 application topically 2 (two) times daily as needed (irritation).    [provider]  nitroGLYCERIN (NITROSTAT) 0.4 MG SL tablet Place 1 tablet (0.4 mg total) under the tongue every 5 (five) minutes as needed (up to 3 doses). For chest pain 04/05/12   Charlie Pitter, PA-C  PARoxetine (PAXIL) 40 MG tablet Take 40 mg by  mouth every morning. 07/01/14   [provider]  Probiotic Product (PROBIOTIC-10) CAPS Take 10 mg by mouth daily.    [provider]  vancomycin (VANCOCIN) 50 mg/mL oral solution Take 2.5 mLs (125 mg total) by mouth 4 (four) times daily. 05/22/17   Orson Eva, MD  zolpidem (AMBIEN) 10 MG tablet Take 5-10 mg by mouth at bedtime.  07/11/14   [provider]     Allergies:    Allergies  Allergen Reactions  . Aspirin Nausea Only    Social History:   Social History   Socioeconomic History  . Marital status: Married    Spouse name: Glendell Docker  . Number of children: 2  . Years of education: Not on file  . Highest education level: Not on file  Occupational History  . Occupation: Part-Time hair-dresser  Tobacco Use  . Smoking status: Never Smoker  . Smokeless tobacco: Never Used  Substance and Sexual Activity  . Alcohol use: No  . Drug use: No  . Sexual activity: Not on file  Other Topics Concern  . Not on file  Social History Narrative  . Not on file   Social Determinants of Health   Financial Resource Strain:   . Difficulty of Paying Living Expenses:   Food Insecurity:   . Worried About Charity fundraiser in the Last Year:   . Arboriculturist in the Last Year:   Transportation Needs:   . Film/video editor (Medical):   Marland Kitchen Lack of Transportation (Non-Medical):   Physical Activity:   . Days of Exercise per Week:   . Minutes of Exercise per Session:   Stress:   . Feeling of Stress :   Social Connections:   . Frequency of Communication with Friends and Family:   . Frequency of Social Gatherings with Friends and Family:   . Attends Religious Services:   . Active Member of Clubs or Organizations:   . Attends Archivist Meetings:   Marland Kitchen Marital Status:   Intimate Partner Violence:   . Fear of Current or Ex-Partner:   . Emotionally Abused:   Marland Kitchen Physically Abused:   . Sexually Abused:     Family History:   The patient's family history  includes Addison's disease in her sister; Cancer in her mother; Diabetes in her mother; Heart attack in her brother; Lung cancer in her daughter. There is no history of Colon cancer.    ROS:  Please see the history of present illness.  All other ROS reviewed and negative.     Physical Exam/Data:   Vitals:   11/02/19 1630 11/02/19 1645 11/02/19 1700 11/02/19 1715  BP: 133/66 120/60 (!) 123/59 (!) 152/63  Pulse: 65 64 61 63  Resp: 18 15 17 17   Temp:      TempSrc:      SpO2: 97% 98% 97% 99%  Weight:      Height:       No intake or output data in the 24 hours ending 11/02/19 1731 Last 3 Weights 11/02/2019 01/31/2018 05/20/2017  Weight (lbs) 147 lb 147 lb 144 lb  Weight (kg) 66.679 kg 66.679 kg 65.318 kg     Body mass index is 26.04 kg/m.  General:  Well nourished, well developed, in no acute distress HEENT: normal Lymph: no adenopathy Neck: no JVD Endocrine:  No thryomegaly Vascular: No carotid bruits; FA pulses 2+ bilaterally without bruits  Cardiac:  normal S1, S2; RRR; no murmur  Lungs:  clear to auscultation bilaterally, no wheezing, rhonchi or rales  Abd: soft, nontender, no hepatomegaly  Ext: no LE edema Musculoskeletal:  No deformities, BUE and BLE strength normal and equal Skin: warm and dry  Neuro:  CNs 2-12 intact, no focal abnormalities noted Psych:  Normal affect    EKG:  The ECG that was done was personally reviewed and demonstrates sinus, new LBBB  Relevant CV Studies:   Laboratory Data:  High Sensitivity Troponin:   Recent Labs  Lab 11/02/19 1535  TROPONINIHS 1,701*      Chemistry Recent Labs  Lab 11/02/19 1535  NA 135  K 3.9  CL 97*  CO2 24  GLUCOSE 336*  BUN 16  CREATININE 0.77  CALCIUM 10.0  GFRNONAA >60  GFRAA >60  ANIONGAP 14    No results for input(s): PROT, ALBUMIN, AST, ALT, ALKPHOS, BILITOT in the last 168 hours. Hematology Recent Labs  Lab 11/02/19 1535  WBC 5.0  RBC 4.18  HGB 13.3  HCT 38.0  MCV 90.9  MCH 31.8  MCHC  35.0  RDW 12.4  PLT 299   BNPNo results for input(s): BNP, PROBNP in the last 168 hours.  DDimer No results for input(s): DDIMER in the last 168 hours.   Radiology/Studies:  DG Chest 2 View  Result Date: 11/02/2019 CLINICAL DATA:  Chest pain since this morning, history of diabetes mellitus, hypertension, coronary artery disease post MI, CABG and coronary stenting EXAM: CHEST - 2 VIEW COMPARISON:  06/18/2016 FINDINGS: Upper normal heart size post CABG. Mediastinal contours and pulmonary vascularity normal. Mild atherosclerotic calcification aorta. Coronary stents identified along RIGHT heart border. Minimal bibasilar atelectasis. Lungs otherwise clear. No pleural effusion or pneumothorax. Bones demineralized. IMPRESSION: Post CABG and coronary stenting. Minimal bibasilar atelectasis. Electronically Signed   By: Lavonia Dana M.D.   On: 11/02/2019 16:01     Assessment and Plan:   1. CAD s/p CABG/NSTEMI: She has ongoing chest pain with new LBBB and elevated troponin. I think the best strategy is urgent cardiac catheterization.  I have reviewed the risks, indications, and alternatives to cardiac catheterization, possible angioplasty, and stenting with the patient. Risks include but are not limited to bleeding, infection, vascular injury, stroke, myocardial infection, arrhythmia, kidney injury, radiation-related injury in the case of prolonged fluoroscopy use, emergency cardiac surgery, and death. The patient understands the risks of serious complication is 1-2 in [id number] with diagnostic cardiac cath and 1-2% or less with angioplasty/stenting.  2. HTN: BP is stable  3. HLD: She is statin intolerant  4. DM: Will review home medications  For questions or updates, please contact [place] Please consult [email or web address] for contact info under     Signed, Lauree Chandler, MD  11/02/2019 5:31 PM

## 2019-11-02 NOTE — ED Provider Notes (Signed)
[facility] EMERGENCY DEPARTMENT Provider Note   CSN: [id number] Arrival date & time: 11/02/19  1517     History Chief Complaint  Patient presents with  . Chest Pain    [name] is a 74 y.o. female.  HPI Patient is a 74 year old female with a PMH of hyperlipidemia, hypertension, GERD, type 2 diabetes and CAD presenting to the ED today due to chest pain.  Patient reports that the pain began at approximately 1000 this morning while she was doing light work around the house.  She says that the pain is aching in nature, localized to the center of her chest and radiates to her left arm.  It was initially 7/10 in severity.  Her pain was nonexertional and nonpleuritic.  She took a half of a hydrocodone and her normal omeprazole which did not improve her pain.  Patient has experienced some nausea and chills but denies vomiting, shortness of breath, fever, diarrhea, cough, congestion or leg swelling.  Patient says the pain is still currently there and is 5/10 in severity.  It does radiate to her back.    Past Medical History:  Diagnosis Date  . Acute myocardial infarction, unspecified site, episode of care unspecified   . Coronary artery disease status post CABG 2005    a. s/p CABG 2005. b. 2011 - DES of SVG to PDA. c. Admitted with CP 03/2012 with stable anatomy by cath, felt noncardiac pain.  . Depression   . Diabetes mellitus   . Elevated LFTs   . Elevated transaminase level    Most recently October 2012 most recent  . Esophageal reflux   . GERD (gastroesophageal reflux disease)   . Hernia   . History of cholecystectomy   . Hyperlipidemia    a. Not on statin due to elevated liver enzymes.  . Hypertension   . Kidney cysts    left    Patient Active Problem List   Diagnosis Date Noted  . Uncontrolled type 2 diabetes mellitus with hyperglycemia ([place]) 05/22/2017  . Intractable vomiting with nausea 05/21/2017  . Diabetes mellitus without complication ([place])  [id number]  . C. difficile colitis 05/20/2017  . Hypokalemia 05/20/2017  . Nausea alone 09/20/2012  . Nonspecific abnormal results of liver function study 09/20/2012  . Elevated transaminase level   . Dyspepsia and other specified disorders of function of stomach 05/18/2011  . FATTY LIVER DISEASE 01/09/2010  . Hyperlipidemia 01/25/2009  . Depression 01/25/2009  . Essential hypertension 01/25/2009  . Coronary atherosclerosis 01/25/2009  . BACK PAIN 01/25/2009  . HLD (hyperlipidemia) 01/25/2009  . Chest pain 01/25/2009  . GERD 09/03/2008  . DYSPHAGIA 09/03/2008  . DIABETES MELLITUS, BORDERLINE 09/03/2008    Past Surgical History:  Procedure Laterality Date  . ABDOMINAL HYSTERECTOMY    . BYPASS GRAFT  08/2003   Quadruple   . CHOLECYSTECTOMY    . HERNIA REPAIR Right 01/2011  . LEFT HEART CATHETERIZATION WITH CORONARY/GRAFT ANGIOGRAM N/A 04/05/2012   Procedure: LEFT HEART CATHETERIZATION WITH Beatrix Fetters;  Surgeon: Sherren Mocha, MD;  Location: [facility] CATH LAB;  Service: Cardiovascular;  Laterality: N/A;     OB History   No obstetric history on file.     Family History  Problem Relation Age of Onset  . Cancer Mother        stomach  . Diabetes Mother   . Heart attack Brother   . Addison's disease Sister   . Lung cancer Daughter        smoker  .  Colon cancer Neg Hx     Social History   Tobacco Use  . Smoking status: Never Smoker  . Smokeless tobacco: Never Used  Substance Use Topics  . Alcohol use: No  . Drug use: No    Home Medications Prior to Admission medications   Medication Sig Start Date End Date Taking? Authorizing Provider  amLODipine (NORVASC) 5 MG tablet Take 1 tablet (5 mg total) by mouth daily. 08/19/14   Lelon Perla, MD  aspirin EC 81 MG tablet Take 1 tablet (81 mg total) by mouth daily. Patient not taking: Reported on 05/20/2017 04/28/16   Lelon Perla, MD  atenolol (TENORMIN) 50 MG tablet Take 75 mg by mouth daily.      [provider]  cloNIDine (CATAPRES) 0.2 MG tablet Take 1 tablet (0.2 mg total) by mouth at bedtime. 10/04/12   Lelon Perla, MD  famotidine (PEPCID) 20 MG tablet Take 1 tablet (20 mg total) by mouth 2 (two) times daily. 12/05/15   Sherwood Gambler, MD  furosemide (LASIX) 40 MG tablet Take 40 mg by mouth daily.      [provider]  gemfibrozil (LOPID) 600 MG tablet Take 600 mg by mouth 2 (two) times daily. 07/23/14   [provider]  glipiZIDE (GLUCOTROL) 10 MG tablet Take 10 mg by mouth 2 (two) times daily. 07/01/14   [provider]  HYDROcodone-acetaminophen (NORCO/VICODIN) 5-325 MG per tablet Take 1 tablet by mouth every 6 (six) hours as needed for moderate pain.    [provider]  losartan (COZAAR) 100 MG tablet Take 100 mg by mouth daily. 06/24/14   [provider]  neomycin-bacitracin-polymyxin (NEOSPORIN) [id number] ointment Apply 1 application topically 2 (two) times daily as needed (irritation).    [provider]  nitroGLYCERIN (NITROSTAT) 0.4 MG SL tablet Place 1 tablet (0.4 mg total) under the tongue every 5 (five) minutes as needed (up to 3 doses). For chest pain 04/05/12   Charlie Pitter, PA-C  PARoxetine (PAXIL) 40 MG tablet Take 40 mg by mouth every morning. 07/01/14   [provider]  Probiotic Product (PROBIOTIC-10) CAPS Take 10 mg by mouth daily.    [provider]  vancomycin (VANCOCIN) 50 mg/mL oral solution Take 2.5 mLs (125 mg total) by mouth 4 (four) times daily. 05/22/17   Orson Eva, MD  zolpidem (AMBIEN) 10 MG tablet Take 5-10 mg by mouth at bedtime.  07/11/14   [provider]    Allergies    Aspirin  Review of Systems   Review of Systems  Constitutional: Negative for chills and fever.  HENT: Negative for rhinorrhea and sore throat.   Eyes: Negative for photophobia and visual disturbance.  Respiratory: Negative for cough, shortness of breath and wheezing.   Cardiovascular:  Positive for chest pain. Negative for palpitations and leg swelling.  Gastrointestinal: Positive for nausea. Negative for abdominal pain, diarrhea and vomiting.  Genitourinary: Negative for dysuria and hematuria.  Musculoskeletal: Positive for back pain. Negative for arthralgias.  Skin: Negative for rash and wound.  Neurological: Negative for weakness, light-headedness and headaches.  Psychiatric/Behavioral: Negative for agitation.  All other systems reviewed and are negative.   Physical Exam Updated Vital Signs BP 138/70 (BP Location: Left Arm)   Pulse 66   Temp 98.4 F (36.9 C) (Oral)   Resp 18   Ht 5\' 3"  (1.6 m)   Wt 66.7 kg   SpO2 98%   BMI 26.04 kg/m   Physical Exam Vitals and  nursing note reviewed.  Constitutional:      General: She is not in acute distress.    Appearance: Normal appearance. She is well-developed and normal weight. She is not ill-appearing.  HENT:     Head: Normocephalic and atraumatic.     Right Ear: External ear normal.     Left Ear: External ear normal.     Nose: Nose normal. No congestion or rhinorrhea.     Mouth/Throat:     Mouth: Mucous membranes are moist.     Pharynx: Oropharynx is clear.  Eyes:     Extraocular Movements: Extraocular movements intact.     Pupils: Pupils are equal, round, and reactive to light.  Cardiovascular:     Rate and Rhythm: Normal rate and regular rhythm.     Pulses: Normal pulses.     Heart sounds: Normal heart sounds.  Pulmonary:     Effort: Pulmonary effort is normal. No respiratory distress.     Breath sounds: Normal breath sounds. No stridor. No wheezing, rhonchi or rales.  Chest:     Chest wall: No tenderness.  Abdominal:     General: There is no distension.     Palpations: Abdomen is soft.     Tenderness: There is no abdominal tenderness.  Musculoskeletal:        General: Normal range of motion.     Cervical back: Normal range of motion and neck supple.     Right lower leg: No edema.     Left lower  leg: No edema.  Skin:    General: Skin is warm and dry.     Capillary Refill: Capillary refill takes less than 2 seconds.  Neurological:     General: No focal deficit present.     Mental Status: She is alert and oriented to person, place, and time. Mental status is at baseline.  Psychiatric:        Mood and Affect: Mood normal.     ED Results / Procedures / Treatments   Labs (all labs ordered are listed, but only abnormal results are displayed) Labs Reviewed  BASIC METABOLIC PANEL - Abnormal; Notable for the following components:      Result Value   Chloride 97 (*)    Glucose, Bld 336 (*)    All other components within normal limits  TROPONIN I (HIGH SENSITIVITY) - Abnormal; Notable for the following components:   Troponin I (High Sensitivity) 1,701 (*)    All other components within normal limits  TROPONIN I (HIGH SENSITIVITY) - Abnormal; Notable for the following components:   Troponin I (High Sensitivity) 8,299 (*)    All other components within normal limits  TROPONIN I (HIGH SENSITIVITY) - Abnormal; Notable for the following components:   Troponin I (High Sensitivity) 11,762 (*)    All other components within normal limits  RESPIRATORY PANEL BY RT PCR (FLU A&B, COVID)  MRSA PCR SCREENING  CBC  CBC  BASIC METABOLIC PANEL  HEPARIN LEVEL (UNFRACTIONATED)  POC SARS CORONAVIRUS 2 AG -  ED    EKG EKG Interpretation  Date/Time:  Friday November 02 2019 15:35:18 EDT Ventricular Rate:  68 PR Interval:  172 QRS Duration: 136 QT Interval:  474 QTC Calculation: 504 R Axis:   -2 Text Interpretation: Normal sinus rhythm Left bundle branch block Abnormal ECG since last tracing no significant change Confirmed by Malvin Johns [phone]) on 11/02/2019 3:50:27 PM   Radiology No results found.  Procedures Procedures (including critical care time)  Medications Ordered in  ED Medications  aspirin chewable tablet 324 mg ( Oral MAR Unhold 11/02/19 1912)  heparin injection 4,000  Units ( Intravenous MAR Unhold 11/02/19 1912)  atenolol (TENORMIN) tablet 75 mg (has no administration in time range)  cloNIDine (CATAPRES) tablet 0.2 mg (has no administration in time range)  furosemide (LASIX) tablet 40 mg (has no administration in time range)  glipiZIDE (GLUCOTROL) tablet 10 mg (has no administration in time range)  nitroGLYCERIN (NITROSTAT) SL tablet 0.4 mg (has no administration in time range)  PARoxetine (PAXIL) tablet 40 mg (has no administration in time range)  labetalol (NORMODYNE) injection 10 mg (has no administration in time range)  hydrALAZINE (APRESOLINE) injection 10 mg (has no administration in time range)  acetaminophen (TYLENOL) tablet 650 mg (has no administration in time range)  ondansetron (ZOFRAN) injection 4 mg (has no administration in time range)  0.9 %  sodium chloride infusion ( Intravenous Not Given 11/02/19 2046)  sodium chloride flush (NS) 0.9 % injection 3 mL (has no administration in time range)  sodium chloride flush (NS) 0.9 % injection 3 mL (has no administration in time range)  0.9 %  sodium chloride infusion (has no administration in time range)  oxyCODONE (Oxy IR/ROXICODONE) immediate release tablet 5-10 mg (5 mg Oral Given 11/03/19 0001)  ticagrelor (BRILINTA) tablet 90 mg (has no administration in time range)  nitroGLYCERIN 50 mg in dextrose 5 % 250 mL (0.2 mg/mL) infusion (25 mcg/min Intravenous Rate/Dose Change 11/02/19 2341)  heparin ADULT infusion 100 units/mL (25000 units/214mL sodium chloride 0.45%) (has no administration in time range)  0.9 %  sodium chloride infusion (999 mLs Intravenous New Bag/Given 11/02/19 1744)  nitroGLYCERIN 50 mg in dextrose 5 % 250 mL (0.2 mg/mL) infusion (10 mcg/min Intravenous New Bag/Given 11/02/19 1822)    ED Course  I have reviewed the triage vital signs and the nursing notes.  Pertinent labs & imaging results that were available during my care of the patient were reviewed by me and considered in my  medical decision making (see chart for details).    MDM Rules/Calculators/A&P                     Patient is a 74 year old female with a PMH of hyperlipidemia, hypertension, GERD, type 2 diabetes and CAD s/p CABG in 2005 presenting to the ED today due to chest pain.  Physical exam unremarkable.  Vital signs stable.  Afebrile.  On arrival, patient appears generally well and is displaying no signs of acute distress.  EKG shows LBBB that is new from previous.  No signs of overlying ischemia.  Chest x-ray shows minimal bibasilar atelectasis.  Patient has equal radial pulses in both arms and she has no significant changes in blood pressure between arms.  However, given her history of CAD and current chest pain radiating to her back, will obtain CTA chest for further evaluation of dissection.  CBC and BMP unremarkable.  Initial troponin 1701.  Consulted interventional cardiology.  Following cardiology team's evaluation, patient taken to Cath Lab.  For details on hospital course following admission, please refer to inpatient team's note.  Patient stable at time of admission.  Patient assessed and evaluated with Dr. Tamera Punt.  Nadeen Landau, MD   Final Clinical Impression(s) / ED Diagnoses Final diagnoses:  None    Rx / DC Orders ED Discharge Orders    None       Nadeen Landau, MD 11/03/19 [id number]    Malvin Johns, MD 11/03/19 1816

## 2019-11-03 ENCOUNTER — Inpatient Hospital Stay (HOSPITAL_COMMUNITY): Payer: Medicare Other

## 2019-11-03 DIAGNOSIS — E118 Type 2 diabetes mellitus with unspecified complications: Secondary | ICD-10-CM

## 2019-11-03 DIAGNOSIS — I10 Essential (primary) hypertension: Secondary | ICD-10-CM

## 2019-11-03 DIAGNOSIS — I351 Nonrheumatic aortic (valve) insufficiency: Secondary | ICD-10-CM

## 2019-11-03 DIAGNOSIS — E782 Mixed hyperlipidemia: Secondary | ICD-10-CM

## 2019-11-03 LAB — HEMOGLOBIN A1C
Hgb A1c MFr Bld: 8.1 % — ABNORMAL HIGH (ref 4.8–5.6)
Mean Plasma Glucose: 185.77 mg/dL

## 2019-11-03 LAB — TROPONIN I (HIGH SENSITIVITY)
Troponin I (High Sensitivity): 11762 ng/L (ref <18)
Troponin I (High Sensitivity): 12407 ng/L (ref <18)
Troponin I (High Sensitivity): 9324 ng/L (ref <18)
Troponin I (High Sensitivity): 7882 ng/L (ref <18)

## 2019-11-03 LAB — BASIC METABOLIC PANEL
Anion gap: 12 (ref 5–15)
BUN: 13 mg/dL (ref 8–23)
CO2: 23 mmol/L (ref 22–32)
Calcium: 9.5 mg/dL (ref 8.9–10.3)
Chloride: 99 mmol/L (ref 98–111)
Creatinine, Ser: 0.71 mg/dL (ref 0.44–1.00)
GFR calc Af Amer: >60 mL/min (ref >60)
GFR calc non Af Amer: >60 mL/min (ref >60)
Glucose, Bld: 344 mg/dL — ABNORMAL HIGH (ref 70–99)
Potassium: 3.9 mmol/L (ref 3.5–5.1)
Sodium: 134 mmol/L — ABNORMAL LOW (ref 135–145)

## 2019-11-03 LAB — CBC
HCT: 35.1 % — ABNORMAL LOW (ref 36.0–46.0)
Hemoglobin: 12.3 g/dL (ref 12.0–15.0)
MCH: 31.6 pg (ref 26.0–34.0)
MCHC: 35 g/dL (ref 30.0–36.0)
MCV: 90.2 fL (ref 80.0–100.0)
Platelets: 249 10*3/uL (ref 150–400)
RBC: 3.89 MIL/uL (ref 3.87–5.11)
RDW: 12.4 % (ref 11.5–15.5)
WBC: 5.9 10*3/uL (ref 4.0–10.5)
nRBC: 0 % (ref 0.0–0.2)

## 2019-11-03 LAB — GLUCOSE, CAPILLARY
Glucose-Capillary: 347 mg/dL — ABNORMAL HIGH (ref 70–99)
Glucose-Capillary: 255 mg/dL — ABNORMAL HIGH (ref 70–99)
Glucose-Capillary: 193 mg/dL — ABNORMAL HIGH (ref 70–99)
Glucose-Capillary: 299 mg/dL — ABNORMAL HIGH (ref 70–99)

## 2019-11-03 LAB — HEPARIN LEVEL (UNFRACTIONATED)
Heparin Unfractionated: <0.1 IU/mL — ABNORMAL LOW (ref 0.30–0.70)
Heparin Unfractionated: 0.57 IU/mL (ref 0.30–0.70)

## 2019-11-03 LAB — ECHOCARDIOGRAM COMPLETE
Height: 63 in
Weight: 2352 oz

## 2019-11-03 MED ORDER — INSULIN ASPART 100 UNIT/ML ~~LOC~~ SOLN
0.0000 [IU] | Freq: Three times a day (TID) | SUBCUTANEOUS | Status: DC
  Administered 2019-11-03: 09:00:00 11 [IU] via SUBCUTANEOUS
  Administered 2019-11-03: 12:00:00 8 [IU] via SUBCUTANEOUS
  Administered 2019-11-03: 17:00:00 3 [IU] via SUBCUTANEOUS
  Administered 2019-11-04: 08:00:00 8 [IU] via SUBCUTANEOUS
  Administered 2019-11-04: 12:00:00 11 [IU] via SUBCUTANEOUS
  Administered 2019-11-04: 16:00:00 5 [IU] via SUBCUTANEOUS
  Administered 2019-11-05 (×2): 8 [IU] via SUBCUTANEOUS
  Administered 2019-11-05: 12:00:00 5 [IU] via SUBCUTANEOUS
  Administered 2019-11-06 – 2019-11-07 (×4): 8 [IU] via SUBCUTANEOUS

## 2019-11-03 MED ORDER — CHLORHEXIDINE GLUCONATE CLOTH 2 % EX PADS
6.0000 | MEDICATED_PAD | Freq: Every day | CUTANEOUS | Status: DC
  Administered 2019-11-03 – 2019-11-05 (×3): 6 via TOPICAL

## 2019-11-03 MED ORDER — EZETIMIBE 10 MG PO TABS
10.0000 mg | ORAL_TABLET | Freq: Every day | ORAL | Status: DC
  Administered 2019-11-03 – 2019-11-07 (×5): 10 mg via ORAL
  Filled 2019-11-03 (×5): qty 1

## 2019-11-03 MED ORDER — INSULIN ASPART 100 UNIT/ML ~~LOC~~ SOLN
0.0000 [IU] | Freq: Every day | SUBCUTANEOUS | Status: DC
  Administered 2019-11-03: 21:00:00 3 [IU] via SUBCUTANEOUS
  Administered 2019-11-04: 22:00:00 4 [IU] via SUBCUTANEOUS
  Administered 2019-11-05 – 2019-11-06 (×2): 3 [IU] via SUBCUTANEOUS

## 2019-11-03 MED ORDER — ASPIRIN 81 MG PO CHEW
81.0000 mg | CHEWABLE_TABLET | Freq: Every day | ORAL | Status: DC
  Administered 2019-11-03 – 2019-11-05 (×3): 81 mg via ORAL
  Filled 2019-11-03 (×3): qty 1

## 2019-11-03 MED ORDER — IOHEXOL 350 MG/ML SOLN
100.0000 mL | Freq: Once | INTRAVENOUS | Status: AC | PRN
  Administered 2019-11-03: 12:00:00 100 mL via INTRAVENOUS

## 2019-11-03 NOTE — Plan of Care (Signed)

## 2019-11-03 NOTE — Progress Notes (Signed)
[place] for Heparin Indication: chest pain/ACS>>rule out PE  Allergies  Allergen Reactions  . Aspirin Nausea Only    Patient Measurements: Height: 5\' 3"  (160 cm) Weight: 147 lb (66.7 kg) IBW/kg (Calculated) : 52.4 Heparin Dosing Weight:66 kg  Vital Signs: Temp: 98.7 F (37.1 C) (03/27 0700) Temp Source: Oral (03/27 0700) BP: 127/62 (03/27 0903) Pulse Rate: 80 (03/27 0903)  Labs: Recent Labs    11/02/19 1535 11/02/19 1928 11/03/19 0217 11/03/19 0733 11/03/19 0936  HGB 13.3  --  12.3  --   --   HCT 38.0  --  35.1*  --   --   PLT 299  --  249  --   --   HEPARINUNFRC  --   --   --   --  <0.10*  CREATININE 0.77  --  0.71  --   --   TROPONINIHS 1,701*   < > 12,407* 9,324* 7,882*   < > = values in this interval not displayed.    Estimated Creatinine Clearance: 56.6 mL/min (by C-G formula based on SCr of 0.71 mg/dL).   Medical History: Past Medical History:  Diagnosis Date  . Acute myocardial infarction, unspecified site, episode of care unspecified   . Coronary artery disease status post CABG 2005    a. s/p CABG 2005. b. 2011 - DES of SVG to PDA. c. Admitted with CP 03/2012 with stable anatomy by cath, felt noncardiac pain.  . Depression   . Diabetes mellitus   . Elevated LFTs   . Elevated transaminase level    Most recently October 2012 most recent  . Esophageal reflux   . GERD (gastroesophageal reflux disease)   . Hernia   . History of cholecystectomy   . Hyperlipidemia    a. Not on statin due to elevated liver enzymes.  . Hypertension   . Kidney cysts    left   Assessment: CC/HPI: CP  PMH: CAD s/p CABG x 4 in 2005, PCI, DM, HLD, HTN, GERD, depression, elevated LFT's, hernia, L kidney cyst  Anticoag: ongoing chest pain with new LBBB and elevated troponin. Patent bypass grafts with flow on cath. Unsure of cause of CP. Will plan IV heparin tonight and then a chest CTA tomorrow to exclude PE. IF r/o PE, the needs  PCI. - 3/26: 1825 femoral sheath out. Scr 0.77. CBC WNL  3/26 cath:Severe triple vessel CAD s/p 4V CABG with 4/4 patent bypass grafts. Unsure cause of CP. IF PE excluded, the lesions in her vein graft to the obtuse marginal branches will need to be treated if there is no other cause for her chest pain and elevated troponin. Will also consider PCI of the native LAD  Patient remains on heparin with CT angio ordered to rule out PE. Heparin level undetectable this morning. Cardiac enzymes trending down. Hgb trending down slightly to 12.3, pltc wnl.   Goal of Therapy:  Heparin level 0.3-0.7 units/ml Monitor platelets by anticoagulation protocol: Yes   Plan:  Increase IV heparin 1250 units/hr Check heparin level tonight For CT this morning  Erin Hearing PharmD., BCPS Clinical Pharmacist 11/03/2019 11:24 AM

## 2019-11-03 NOTE — Progress Notes (Signed)
  Echocardiogram 2D Echocardiogram has been performed.  [name] M 11/03/2019, 11:50 AM

## 2019-11-03 NOTE — Progress Notes (Signed)
Cardiology Progress Note  Patient ID: [name] MRN: [id number] DOB: Jul 03, 1946 Date of Encounter: 11/03/2019  Primary Cardiologist: No primary care provider on file.  Subjective  Admitted overnight for non-STEMI.  Had left bundle branch block and elevated troponins.  Was taken to the Cath Lab emergently.  Has severe three-vessel disease.  Her vein grafts are all patent.  She does have severe stenoses in the proximal LAD that supplies a large diagonal branch as well as proximal vein graft to the obtuse marginal branches.  She denies any chest pain today.  She remains on heparin drip as well as nitroglycerin drip.  ROS:  All other ROS reviewed and negative. Pertinent positives noted in the HPI.     Inpatient Medications  Scheduled Meds: . aspirin  324 mg Oral Once  . aspirin  81 mg Oral Daily  . atenolol  75 mg Oral Daily  . Chlorhexidine Gluconate Cloth  6 each Topical Daily  . cloNIDine  0.2 mg Oral QHS  . furosemide  40 mg Oral Daily  . glipiZIDE  10 mg Oral BID WC  . heparin  4,000 Units Intravenous Once  . insulin aspart  0-15 Units Subcutaneous TID WC  . insulin aspart  0-5 Units Subcutaneous QHS  . PARoxetine  40 mg Oral q morning - 10a  . sodium chloride flush  3 mL Intravenous Q12H  . ticagrelor  90 mg Oral BID   Continuous Infusions: . sodium chloride    . heparin 1,050 Units/hr (11/03/19 0230)  . nitroGLYCERIN 25 mcg/min (11/02/19 2341)   PRN Meds: sodium chloride, acetaminophen, nitroGLYCERIN, ondansetron (ZOFRAN) IV, oxyCODONE, sodium chloride flush   Vital Signs   Vitals:   11/03/19 0400 11/03/19 0500 11/03/19 0600 11/03/19 0700  BP: 124/70  123/77   Pulse: 73 73 75 79  Resp: 16 13 19 19   Temp: 98.7 F (37.1 C)   98.7 F (37.1 C)  TempSrc: Oral   Oral  SpO2: 99% 97% 98%   Weight:      Height:        Intake/Output Summary (Last 24 hours) at 11/03/2019 0813 Last data filed at 11/03/2019 0600 Gross per 24 hour  Intake 198.3 ml  Output 1000 ml    Net -801.7 ml   Last 3 Weights 11/02/2019 01/31/2018 05/20/2017  Weight (lbs) 147 lb 147 lb 144 lb  Weight (kg) 66.679 kg 66.679 kg 65.318 kg      Telemetry  Overnight telemetry shows normal sinus rhythm with heart rate in 70s, which I personally reviewed.   ECG  The most recent ECG shows normal sinus rhythm heart rate 79, left bundle branch block, which I personally reviewed.   Physical Exam   Vitals:   11/03/19 0400 11/03/19 0500 11/03/19 0600 11/03/19 0700  BP: 124/70  123/77   Pulse: 73 73 75 79  Resp: 16 13 19 19   Temp: 98.7 F (37.1 C)   98.7 F (37.1 C)  TempSrc: Oral   Oral  SpO2: 99% 97% 98%   Weight:      Height:         Intake/Output Summary (Last 24 hours) at 11/03/2019 0813 Last data filed at 11/03/2019 0600 Gross per 24 hour  Intake 198.3 ml  Output 1000 ml  Net -801.7 ml    Last 3 Weights 11/02/2019 01/31/2018 05/20/2017  Weight (lbs) 147 lb 147 lb 144 lb  Weight (kg) 66.679 kg 66.679 kg 65.318 kg    Body mass index is 26.04  kg/m.  General: Well nourished, well developed, in no acute distress Head: Atraumatic, normal size  Eyes: PEERLA, EOMI  Neck: Supple, no JVD Endocrine: No thryomegaly Cardiac: Normal S1, S2; RRR; no murmurs, rubs, or gallops Lungs: Clear to auscultation bilaterally, no wheezing, rhonchi or rales  Abd: Soft, nontender, no hepatomegaly  Ext: No edema, left radial cath site clean dry without hematoma, right femoral cath site clean dry without hematoma or evidence of bruit Musculoskeletal: No deformities, BUE and BLE strength normal and equal Skin: Warm and dry, no rashes   Neuro: Alert and oriented to person, place, time, and situation, CNII-XII grossly intact, no focal deficits  Psych: Normal mood and affect   Labs  High Sensitivity Troponin:   Recent Labs  Lab 11/02/19 1535 11/02/19 1928 11/02/19 2258 11/03/19 0217  TROPONINIHS 1,701* 8,299* 11,762* 12,407*     Cardiac EnzymesNo results for input(s): TROPONINI in the last  168 hours. No results for input(s): TROPIPOC in the last 168 hours.  Chemistry Recent Labs  Lab 11/02/19 1535 11/03/19 0217  NA 135 134*  K 3.9 3.9  CL 97* 99  CO2 24 23  GLUCOSE 336* 344*  BUN 16 13  CREATININE 0.77 0.71  CALCIUM 10.0 9.5  GFRNONAA >60 >60  GFRAA >60 >60  ANIONGAP 14 12    Hematology Recent Labs  Lab 11/02/19 1535 11/03/19 0217  WBC 5.0 5.9  RBC 4.18 3.89  HGB 13.3 12.3  HCT 38.0 35.1*  MCV 90.9 90.2  MCH 31.8 31.6  MCHC 35.0 35.0  RDW 12.4 12.4  PLT 299 249   BNPNo results for input(s): BNP, PROBNP in the last 168 hours.  DDimer No results for input(s): DDIMER in the last 168 hours.   Radiology  DG Chest 2 View  Result Date: 11/02/2019 CLINICAL DATA:  Chest pain since this morning, history of diabetes mellitus, hypertension, coronary artery disease post MI, CABG and coronary stenting EXAM: CHEST - 2 VIEW COMPARISON:  06/18/2016 FINDINGS: Upper normal heart size post CABG. Mediastinal contours and pulmonary vascularity normal. Mild atherosclerotic calcification aorta. Coronary stents identified along RIGHT heart border. Minimal bibasilar atelectasis. Lungs otherwise clear. No pleural effusion or pneumothorax. Bones demineralized. IMPRESSION: Post CABG and coronary stenting. Minimal bibasilar atelectasis. Electronically Signed   By: Lavonia Dana M.D.   On: 11/02/2019 16:01   CARDIAC CATHETERIZATION  Addendum Date: 11/02/2019    Prox RCA lesion is 99% stenosed.  Mid RCA lesion is 100% stenosed.  SVG graft was visualized by angiography.  Prox Graft to Mid Graft lesion is 40% stenosed.  Prox Cx to Mid Cx lesion is 100% stenosed.  Origin to Prox Graft lesion before 2nd Mrg is 70% stenosed.  Mid Graft lesion before 2nd Mrg is 90% stenosed.  Dist Graft to Insertion lesion before 2nd Mrg is 70% stenosed.  Mid LAD lesion is 100% stenosed.  LIMA graft was visualized by angiography.  Prox LAD lesion is 95% stenosed.  1. Severe triple vessel CAD s/p 4V CABG  with 4/4 patent bypass grafts. 2. The native LAD has a severe stenosis just before the Diagonal branch and is then occluded in the mid segment. The LIMA is patent to the LAD. 3. The native Circumflex is occluded in the mid segment. The first and second OM branches are supplied by the patent vein graft. The vein graft has severe proximal, mid and distal body stenosis. 4. The native RCA is a small to moderate caliber vessel with severe proximal stenosis before a RV  marginal branch and then total chronic mid occlusion. The vein graft to the distal RCA branches is patent. The mid body of the vein graft has a patent stent with mild to moderate restenosis. Recommendations: She currently has flow down all of her grafts. The vein graft to the OM system has three areas of stenosis but good flow. It is not clear what is causing her pain. Will plan to admit to the ICU tonight. She was loaded with Brilinta 180 mg po x 1 in the cath lab. Will continue Brilinta 90 mg po BID. She is intolerant of ASA. I think it will be important to exclude PE before we move forward with PCI. Will plan IV heparin tonight and then a chest CTA tomorrow to excluce PE. The lesions in her vein graft to the obtuse marginal branches will need to be treated if there is no other cause for her chest pain and elevated troponin. Will also consider PCI of the native LAD which supplies the moderate caliber Diagonal branch. Will start a heparin drip 8 hours post sheath pull. Echo tomorrow. NTG drip. Continue beta blocker. She is statin intolerant.   Result Date: 11/02/2019  Prox RCA lesion is 99% stenosed.  Mid RCA lesion is 100% stenosed.  SVG graft was visualized by angiography.  Prox Graft to Mid Graft lesion is 40% stenosed.  Prox Cx to Mid Cx lesion is 100% stenosed.  Origin to Prox Graft lesion before 2nd Mrg is 70% stenosed.  Mid Graft lesion before 2nd Mrg is 90% stenosed.  Dist Graft to Insertion lesion before 2nd Mrg is 70% stenosed.  Mid  LAD lesion is 100% stenosed.  LIMA graft was visualized by angiography.  Prox LAD lesion is 95% stenosed.  1. Severe triple vessel CAD s/p 4V CABG with 4/4 patent bypass grafts. 2. The native LAD has a severe stenosis just before the Diagonal branch and is then occluded in the mid segment. The LIMA is patent to the LAD. 3. The native Circumflex is occluded in the mid segment. The first and second OM branches are supplied by the patent vein graft. The vein graft has severe proximal, mid and distal body stenosis. 4. The native RCA is a small to moderate caliber vessel with severe proximal stenosis before a RV marginal branch and then total chronic mid occlusion. The vein graft to the distal RCA branches is patent. The mid body of the vein graft has a patent stent with mild to moderate restenosis. Recommendations: She currently has flow down all of her grafts. The vein graft to the OM system has three areas of stenosis. Will plan to admit to the ICU tonight. She was loaded with Brilinta 180 mg po x 1 in the cath lab. Will continue Brilinta 90 mg po BID. She is intolerant of ASA. The lesions in her vein graft to the obtuse marginal branches will need to be treated. Will also consider PCI of the native LAD which supplies the moderate caliber Diagonal branch. Will start a heparin drip 8 hours post sheath pull. Echo tomorrow. NTG drip. Plan PCI of the SVG to OM branches and possibly the proximal LAD on Monday. Continue beta blocker. She is statin intolerant. During the case, we had a STEMI activation for another patient. Since there is no acutely occluded vessel, her PCI will be delayed. She will be loaded with Brilinta today with PCI on Monday.    Cardiac Studies  [facility] 11/02/2019 1. Severe triple vessel CAD s/p 4V CABG with  4/4 patent bypass grafts.  2. The native LAD has a severe stenosis just before the Diagonal branch and is then occluded in the mid segment. The LIMA is patent to the LAD.  3. The native  Circumflex is occluded in the mid segment. The first and second OM branches are supplied by the patent vein graft. The vein graft has severe proximal, mid and distal body stenosis.  4. The native RCA is a small to moderate caliber vessel with severe proximal stenosis before a RV marginal branch and then total chronic mid occlusion. The vein graft to the distal RCA branches is patent. The mid body of the vein graft has a patent stent with mild to moderate restenosis.   Patient Profile  [name] is a 74 y.o. female with diabetes, CAD status post CABG, hyperlipidemia, GERD who was admitted on 11/02/2019 for non-STEMI.  Assessment & Plan   1.  Non-STEMI -Admitted with 1 day of chest pain.  Pain did not go away.  Troponin values have peaked at 12,000 and are trending.  EKG with a bundle branch block. -Was taken to Cath Lab on 11/02/2019.  She has 1% occlusion of her mid LAD, proximal left circumflex, proximal RCA -LIMA to LAD is patent, vein graft to distal RCA is patent, vein graft to OM 1/2 has a severe proximal stenosis as well as moderate mid and distal stenoses.  Good flow was noted during the procedure. She also has a 95% proximal LAD stenosis that supplies a large diagonal branch. -Interventional cardiology did not proceed with intervention last night.  Plans for CTA PE study to rule out PE. -Left radial cath site looks clean and dry without hematoma.  Right femoral cath site looks good as well. -I suspect we will intervene on the vein graft OM versus the LAD that supplies a large diagonal branch.  I think a stent to the proximal LAD would not compromise the LIMA to LAD as she has had a percent occlusion of the mid LAD.  We will discuss this with interventional moving forward. -For now she is chest pain-free.  She is on a nitroglycerin drip, we will wean that. -We will continue to trend troponins and obtain echocardiogram today. -She was given aspirin 325 yesterday we will continue 81 mg  daily -She was loaded with Brilinta and will continue this 90 mg twice daily -She is statin intolerant.  I will start Zetia.  She will need to be evaluated for PCSK9 inhibitors as outpatient -She remains on home atenolol 75 mg daily -Pending CTA PE study we will likely proceed with PCI as detailed above on Monday  2. HTN -Continue home beta-blocker.  Continue home clonidine.  3.  Diabetes -A1c is 8.0 -Sliding scale insulin while in-house -I will hold her oral hypoglycemic agents while she is here.  We will consider an SGLT2 inhibitor given her CAD at discharge  4.  Hyperlipidemia -Statin intolerant.  We will start Zetia.  Should be considered for PCSK9 inhibitors outpatient.  FEN -IVF post cath -DVT ppx: heparin -diet: general -code: full   For questions or updates, please contact [place] Please consult [email or web address] for contact info under   Time Spent with Patient: I have spent a total of 35 minutes with patient reviewing hospital notes, telemetry, EKGs, labs and examining the patient as well as establishing an assessment and plan that was discussed with the patient.  > 50% of time was spent in direct patient care.    Signed,  [place] T. Audie Box, [place]  11/03/2019 8:13 AM

## 2019-11-03 NOTE — Progress Notes (Signed)
[place] for Heparin Indication: chest pain/ACS>>rule out PE  Allergies  Allergen Reactions  . Aspirin Nausea Only    Patient Measurements: Height: 5\' 3"  (160 cm) Weight: 147 lb (66.7 kg) IBW/kg (Calculated) : 52.4 Heparin Dosing Weight:66 kg  Vital Signs: Temp: 98.9 F (37.2 C) (03/27 2000) Temp Source: Oral (03/27 2000) BP: 154/77 (03/27 2116) Pulse Rate: 80 (03/27 1700)  Labs: Recent Labs    11/02/19 1535 11/02/19 1928 11/03/19 0217 11/03/19 0733 11/03/19 0936 11/03/19 2009  HGB 13.3  --  12.3  --   --   --   HCT 38.0  --  35.1*  --   --   --   PLT 299  --  249  --   --   --   HEPARINUNFRC  --   --   --   --  <0.10* 0.57  CREATININE 0.77  --  0.71  --   --   --   TROPONINIHS 1,701*   < > 12,407* 9,324* 7,882*  --    < > = values in this interval not displayed.    Estimated Creatinine Clearance: 56.6 mL/min (by C-G formula based on SCr of 0.71 mg/dL).   Medical History: Past Medical History:  Diagnosis Date  . Acute myocardial infarction, unspecified site, episode of care unspecified   . Coronary artery disease status post CABG 2005    a. s/p CABG 2005. b. 2011 - DES of SVG to PDA. c. Admitted with CP 03/2012 with stable anatomy by cath, felt noncardiac pain.  . Depression   . Diabetes mellitus   . Elevated LFTs   . Elevated transaminase level    Most recently October 2012 most recent  . Esophageal reflux   . GERD (gastroesophageal reflux disease)   . Hernia   . History of cholecystectomy   . Hyperlipidemia    a. Not on statin due to elevated liver enzymes.  . Hypertension   . Kidney cysts    left   Assessment: CC/HPI: CP  PMH: CAD s/p CABG x 4 in 2005, PCI, DM, HLD, HTN, GERD, depression, elevated LFT's, hernia, L kidney cyst  Anticoag: ongoing chest pain with new LBBB and elevated troponin. Patent bypass grafts with flow on cath. Unsure of cause of CP. Will plan IV heparin tonight and then a chest CTA  tomorrow to exclude PE. IF r/o PE, the needs PCI. - 3/26: 1825 femoral sheath out. Scr 0.77. CBC WNL  3/26 cath:Severe triple vessel CAD s/p 4V CABG with 4/4 patent bypass grafts. Unsure cause of CP. IF PE excluded, the lesions in her vein graft to the obtuse marginal branches will need to be treated if there is no other cause for her chest pain and elevated troponin. Will also consider PCI of the native LAD  Patient remains on heparin with CT angio ordered to rule out PE. Heparin level undetectable this morning. Cardiac enzymes trending down. Hgb trending down slightly to 12.3, pltc wnl.   PM f/u > heparin level at goal this evening.  Goal of Therapy:  Heparin level 0.3-0.7 units/ml Monitor platelets by anticoagulation protocol: Yes   Plan:  Continue IV heparin at 1250 units/hr Daily heparin level and CBC.  Marguerite Olea, [facility] Clinical Pharmacist Phone [phone]  11/03/2019 9:45 PM

## 2019-11-04 DIAGNOSIS — E1165 Type 2 diabetes mellitus with hyperglycemia: Secondary | ICD-10-CM

## 2019-11-04 LAB — GLUCOSE, CAPILLARY
Glucose-Capillary: 280 mg/dL — ABNORMAL HIGH (ref 70–99)
Glucose-Capillary: 342 mg/dL — ABNORMAL HIGH (ref 70–99)
Glucose-Capillary: 218 mg/dL — ABNORMAL HIGH (ref 70–99)
Glucose-Capillary: 322 mg/dL — ABNORMAL HIGH (ref 70–99)

## 2019-11-04 LAB — CBC
HCT: 37.2 % (ref 36.0–46.0)
Hemoglobin: 13 g/dL (ref 12.0–15.0)
MCH: 31.4 pg (ref 26.0–34.0)
MCHC: 34.9 g/dL (ref 30.0–36.0)
MCV: 89.9 fL (ref 80.0–100.0)
Platelets: 239 10*3/uL (ref 150–400)
RBC: 4.14 MIL/uL (ref 3.87–5.11)
RDW: 12 % (ref 11.5–15.5)
WBC: 5.4 10*3/uL (ref 4.0–10.5)
nRBC: 0 % (ref 0.0–0.2)

## 2019-11-04 LAB — LIPID PANEL
Cholesterol: 196 mg/dL (ref 0–200)
HDL: 35 mg/dL — ABNORMAL LOW (ref >40)
LDL Cholesterol: 83 mg/dL (ref 0–99)
Total CHOL/HDL Ratio: 5.6 RATIO
Triglycerides: 392 mg/dL — ABNORMAL HIGH (ref <150)
VLDL: 78 mg/dL — ABNORMAL HIGH (ref 0–40)

## 2019-11-04 LAB — BASIC METABOLIC PANEL
Anion gap: 12 (ref 5–15)
BUN: 11 mg/dL (ref 8–23)
CO2: 25 mmol/L (ref 22–32)
Calcium: 9.6 mg/dL (ref 8.9–10.3)
Chloride: 98 mmol/L (ref 98–111)
Creatinine, Ser: 0.56 mg/dL (ref 0.44–1.00)
GFR calc Af Amer: >60 mL/min (ref >60)
GFR calc non Af Amer: >60 mL/min (ref >60)
Glucose, Bld: 287 mg/dL — ABNORMAL HIGH (ref 70–99)
Potassium: 3.5 mmol/L (ref 3.5–5.1)
Sodium: 135 mmol/L (ref 135–145)

## 2019-11-04 LAB — HEPARIN LEVEL (UNFRACTIONATED)
Heparin Unfractionated: 0.86 IU/mL — ABNORMAL HIGH (ref 0.30–0.70)
Heparin Unfractionated: 0.67 IU/mL (ref 0.30–0.70)

## 2019-11-04 MED ORDER — BISACODYL 5 MG PO TBEC
5.0000 mg | DELAYED_RELEASE_TABLET | Freq: Every day | ORAL | Status: DC | PRN
  Administered 2019-11-04: 12:00:00 5 mg via ORAL
  Filled 2019-11-04: qty 1

## 2019-11-04 MED ORDER — POTASSIUM CHLORIDE CRYS ER 20 MEQ PO TBCR
40.0000 meq | EXTENDED_RELEASE_TABLET | Freq: Once | ORAL | Status: AC
  Administered 2019-11-04: 06:00:00 40 meq via ORAL
  Filled 2019-11-04: qty 2

## 2019-11-04 MED ORDER — DOCUSATE SODIUM 100 MG PO CAPS
100.0000 mg | ORAL_CAPSULE | Freq: Two times a day (BID) | ORAL | Status: DC
  Administered 2019-11-04 – 2019-11-07 (×6): 100 mg via ORAL
  Filled 2019-11-04 (×7): qty 1

## 2019-11-04 NOTE — Progress Notes (Signed)
[place] for Heparin Indication: chest pain/ACS>>rule out PE  Allergies  Allergen Reactions  . Aspirin Nausea Only    Patient Measurements: Height: 5\' 3"  (160 cm) Weight: 147 lb (66.7 kg) IBW/kg (Calculated) : 52.4 Heparin Dosing Weight:66 kg  Vital Signs: Temp: 99.1 F (37.3 C) (03/28 0000) Temp Source: Oral (03/28 0000) BP: 132/73 (03/28 0200) Pulse Rate: 51 (03/28 0200)  Labs: Recent Labs    11/02/19 1535 11/02/19 1928 11/03/19 0217 11/03/19 0733 11/03/19 0936 11/03/19 2009 11/04/19 0130  HGB 13.3   < > 12.3  --   --   --  13.0  HCT 38.0  --  35.1*  --   --   --  37.2  PLT 299  --  249  --   --   --  239  HEPARINUNFRC  --   --   --   --  <0.10* 0.57 0.86*  CREATININE 0.77  --  0.71  --   --   --  0.56  TROPONINIHS 1,701*   < > 12,407* 9,324* 7,882*  --   --    < > = values in this interval not displayed.    Estimated Creatinine Clearance: 56.6 mL/min (by C-G formula based on SCr of 0.56 mg/dL).   Medical History: Past Medical History:  Diagnosis Date  . Acute myocardial infarction, unspecified site, episode of care unspecified   . Coronary artery disease status post CABG 2005    a. s/p CABG 2005. b. 2011 - DES of SVG to PDA. c. Admitted with CP 03/2012 with stable anatomy by cath, felt noncardiac pain.  . Depression   . Diabetes mellitus   . Elevated LFTs   . Elevated transaminase level    Most recently October 2012 most recent  . Esophageal reflux   . GERD (gastroesophageal reflux disease)   . Hernia   . History of cholecystectomy   . Hyperlipidemia    a. Not on statin due to elevated liver enzymes.  . Hypertension   . Kidney cysts    left   Assessment: CC/HPI: CP  PMH: CAD s/p CABG x 4 in 2005, PCI, DM, HLD, HTN, GERD, depression, elevated LFT's, hernia, L kidney cyst  Anticoag: ongoing chest pain with new LBBB and elevated troponin. Patent bypass grafts with flow on cath. Unsure of cause of CP. Will plan  IV heparin tonight and then a chest CTA tomorrow to exclude PE. IF r/o PE, the needs PCI. - 3/26: 1825 femoral sheath out. Scr 0.77. CBC WNL  3/26 cath:Severe triple vessel CAD s/p 4V CABG with 4/4 patent bypass grafts. Unsure cause of CP. IF PE excluded, the lesions in her vein graft to the obtuse marginal branches will need to be treated if there is no other cause for her chest pain and elevated troponin. Will also consider PCI of the native LAD  CT negative for PE  Heparin level elevated this AM at 0.86   Goal of Therapy:  Heparin level 0.3-0.7 units/ml Monitor platelets by anticoagulation protocol: Yes   Plan:  Decrease heparin to 1150 units / hr 10 am heparin level  Thank you Anette Guarneri, PharmD  11/04/2019 3:04 AM

## 2019-11-04 NOTE — Plan of Care (Signed)
Pt is alert and oriented, on room air, breathing is even and unlabored. Pt denies shortness of breath, chest pain, and nausea. Pt has rested well throughout the night with no complaints, is able to stand up to bathroom with minimal assistance. Pt has maintained NSR with BBB. Heparin gtt is infusing thru peripheral IV, no bruising or bleeding noted. Access sites are asymptomatic. Pt calls out when needing assistance. Call bell is within reach ,bed is in lowest position, and bed alarm is on. Problem: Activity: Goal: Ability to return to baseline activity level will improve Outcome: Progressing   Problem: Cardiovascular: Goal: Vascular access site(s) Level 0-1 will be maintained Outcome: Progressing   Problem: Clinical Measurements: Goal: Ability to maintain clinical measurements within normal limits will improve Outcome: Progressing Goal: Cardiovascular complication will be avoided Outcome: Progressing   Problem: Activity: Goal: Risk for activity intolerance will decrease Outcome: Progressing

## 2019-11-04 NOTE — Progress Notes (Signed)
Cardiology Progress Note  Patient ID: [name] MRN: [id number] DOB: [birth year] Date of Encounter: 11/04/2019  Primary Cardiologist: No primary care provider on file.  Subjective  No chest pain reported.  She has been stable.  Off nitroglycerin drip.  Telemetry unremarkable.  ROS:  All other ROS reviewed and negative. Pertinent positives noted in the HPI.     Inpatient Medications  Scheduled Meds: . aspirin  324 mg Oral Once  . aspirin  81 mg Oral Daily  . atenolol  75 mg Oral Daily  . Chlorhexidine Gluconate Cloth  6 each Topical Daily  . cloNIDine  0.2 mg Oral QHS  . ezetimibe  10 mg Oral Daily  . furosemide  40 mg Oral Daily  . insulin aspart  0-15 Units Subcutaneous TID WC  . insulin aspart  0-5 Units Subcutaneous QHS  . PARoxetine  40 mg Oral q morning - 10a  . sodium chloride flush  3 mL Intravenous Q12H  . ticagrelor  90 mg Oral BID   Continuous Infusions: . sodium chloride 10 mL/hr at 11/04/19 0700  . heparin 1,150 Units/hr (11/04/19 0700)  . nitroGLYCERIN Stopped (11/03/19 1130)   PRN Meds: sodium chloride, acetaminophen, nitroGLYCERIN, ondansetron (ZOFRAN) IV, oxyCODONE, sodium chloride flush   Vital Signs   Vitals:   11/04/19 0400 11/04/19 0500 11/04/19 0600 11/04/19 0700  BP: 134/73 (!) 145/83 132/69 135/75  Pulse: 69 67 70 71  Resp: (!) 22 12 (!) 21 14  Temp:      TempSrc:      SpO2: 96% 91% 94% 97%  Weight: 64.5 kg     Height:        Intake/Output Summary (Last 24 hours) at 11/04/2019 0728 Last data filed at 11/04/2019 0700 Gross per 24 hour  Intake 822.36 ml  Output 2200 ml  Net -1377.64 ml   Last 3 Weights 11/04/2019 11/02/2019 01/31/2018  Weight (lbs) 142 lb 3.2 oz 147 lb 147 lb  Weight (kg) 64.5 kg 66.679 kg 66.679 kg      Telemetry  Overnight telemetry shows normal sinus rhythm with heart rate in 60-70, which I personally reviewed.   ECG  The most recent ECG shows normal sinus rhythm heart rate 79, left bundle branch block, which I  personally reviewed.   Physical Exam   Vitals:   11/04/19 0400 11/04/19 0500 11/04/19 0600 11/04/19 0700  BP: 134/73 (!) 145/83 132/69 135/75  Pulse: 69 67 70 71  Resp: (!) 22 12 (!) 21 14  Temp:      TempSrc:      SpO2: 96% 91% 94% 97%  Weight: 64.5 kg     Height:         Intake/Output Summary (Last 24 hours) at 11/04/2019 0728 Last data filed at 11/04/2019 0700 Gross per 24 hour  Intake 822.36 ml  Output 2200 ml  Net -1377.64 ml    Last 3 Weights 11/04/2019 11/02/2019 01/31/2018  Weight (lbs) 142 lb 3.2 oz 147 lb 147 lb  Weight (kg) 64.5 kg 66.679 kg 66.679 kg    Body mass index is 25.19 kg/m.  General: Well nourished, well developed, in no acute distress Head: Atraumatic, normal size  Eyes: PEERLA, EOMI  Neck: Supple, no JVD Endocrine: No thryomegaly Cardiac: Normal S1, S2; RRR; no murmurs, rubs, or gallops Lungs: Clear to auscultation bilaterally, no wheezing, rhonchi or rales  Abd: Soft, nontender, no hepatomegaly  Ext: No edema, left radial cath site clean dry without hematoma, right femoral cath site clean  dry without hematoma or evidence of bruit Musculoskeletal: No deformities, BUE and BLE strength normal and equal Skin: Warm and dry, no rashes   Neuro: Alert and oriented to person, place, time, and situation, CNII-XII grossly intact, no focal deficits  Psych: Normal mood and affect   Labs  High Sensitivity Troponin:   Recent Labs  Lab 11/02/19 1928 11/02/19 2258 11/03/19 0217 11/03/19 0733 11/03/19 0936  TROPONINIHS 8,299* 11,762* 12,407* 9,324* 7,882*     Cardiac EnzymesNo results for input(s): TROPONINI in the last 168 hours. No results for input(s): TROPIPOC in the last 168 hours.  Chemistry Recent Labs  Lab 11/02/19 1535 11/03/19 0217 11/04/19 0130  NA 135 134* 135  K 3.9 3.9 3.5  CL 97* 99 98  CO2 24 23 25   GLUCOSE 336* 344* 287*  BUN 16 13 11   CREATININE 0.77 0.71 0.56  CALCIUM 10.0 9.5 9.6  GFRNONAA >60 >60 >60  GFRAA >60 >60 >60    ANIONGAP 14 12 12     Hematology Recent Labs  Lab 11/02/19 1535 11/03/19 0217 11/04/19 0130  WBC 5.0 5.9 5.4  RBC 4.18 3.89 4.14  HGB 13.3 12.3 13.0  HCT 38.0 35.1* 37.2  MCV 90.9 90.2 89.9  MCH 31.8 31.6 31.4  MCHC 35.0 35.0 34.9  RDW 12.4 12.4 12.0  PLT 299 249 239   BNPNo results for input(s): BNP, PROBNP in the last 168 hours.  DDimer No results for input(s): DDIMER in the last 168 hours.   Radiology  DG Chest 2 View  Result Date: 11/02/2019 CLINICAL DATA:  Chest pain since this morning, history of diabetes mellitus, hypertension, coronary artery disease post MI, CABG and coronary stenting EXAM: CHEST - 2 VIEW COMPARISON:  06/18/2016 FINDINGS: Upper normal heart size post CABG. Mediastinal contours and pulmonary vascularity normal. Mild atherosclerotic calcification aorta. Coronary stents identified along RIGHT heart border. Minimal bibasilar atelectasis. Lungs otherwise clear. No pleural effusion or pneumothorax. Bones demineralized. IMPRESSION: Post CABG and coronary stenting. Minimal bibasilar atelectasis. Electronically Signed   By: Lavonia Dana M.D.   On: 11/02/2019 16:01   CARDIAC CATHETERIZATION  Addendum Date: 11/02/2019    Prox RCA lesion is 99% stenosed.  Mid RCA lesion is 100% stenosed.  SVG graft was visualized by angiography.  Prox Graft to Mid Graft lesion is 40% stenosed.  Prox Cx to Mid Cx lesion is 100% stenosed.  Origin to Prox Graft lesion before 2nd Mrg is 70% stenosed.  Mid Graft lesion before 2nd Mrg is 90% stenosed.  Dist Graft to Insertion lesion before 2nd Mrg is 70% stenosed.  Mid LAD lesion is 100% stenosed.  LIMA graft was visualized by angiography.  Prox LAD lesion is 95% stenosed.  1. Severe triple vessel CAD s/p 4V CABG with 4/4 patent bypass grafts. 2. The native LAD has a severe stenosis just before the Diagonal branch and is then occluded in the mid segment. The LIMA is patent to the LAD. 3. The native Circumflex is occluded in the mid  segment. The first and second OM branches are supplied by the patent vein graft. The vein graft has severe proximal, mid and distal body stenosis. 4. The native RCA is a small to moderate caliber vessel with severe proximal stenosis before a RV marginal branch and then total chronic mid occlusion. The vein graft to the distal RCA branches is patent. The mid body of the vein graft has a patent stent with mild to moderate restenosis. Recommendations: She currently has flow down all of  her grafts. The vein graft to the OM system has three areas of stenosis but good flow. It is not clear what is causing her pain. Will plan to admit to the ICU tonight. She was loaded with Brilinta 180 mg po x 1 in the cath lab. Will continue Brilinta 90 mg po BID. She is intolerant of ASA. I think it will be important to exclude PE before we move forward with PCI. Will plan IV heparin tonight and then a chest CTA tomorrow to excluce PE. The lesions in her vein graft to the obtuse marginal branches will need to be treated if there is no other cause for her chest pain and elevated troponin. Will also consider PCI of the native LAD which supplies the moderate caliber Diagonal branch. Will start a heparin drip 8 hours post sheath pull. Echo tomorrow. NTG drip. Continue beta blocker. She is statin intolerant.   Result Date: 11/02/2019  Prox RCA lesion is 99% stenosed.  Mid RCA lesion is 100% stenosed.  SVG graft was visualized by angiography.  Prox Graft to Mid Graft lesion is 40% stenosed.  Prox Cx to Mid Cx lesion is 100% stenosed.  Origin to Prox Graft lesion before 2nd Mrg is 70% stenosed.  Mid Graft lesion before 2nd Mrg is 90% stenosed.  Dist Graft to Insertion lesion before 2nd Mrg is 70% stenosed.  Mid LAD lesion is 100% stenosed.  LIMA graft was visualized by angiography.  Prox LAD lesion is 95% stenosed.  1. Severe triple vessel CAD s/p 4V CABG with 4/4 patent bypass grafts. 2. The native LAD has a severe stenosis just  before the Diagonal branch and is then occluded in the mid segment. The LIMA is patent to the LAD. 3. The native Circumflex is occluded in the mid segment. The first and second OM branches are supplied by the patent vein graft. The vein graft has severe proximal, mid and distal body stenosis. 4. The native RCA is a small to moderate caliber vessel with severe proximal stenosis before a RV marginal branch and then total chronic mid occlusion. The vein graft to the distal RCA branches is patent. The mid body of the vein graft has a patent stent with mild to moderate restenosis. Recommendations: She currently has flow down all of her grafts. The vein graft to the OM system has three areas of stenosis. Will plan to admit to the ICU tonight. She was loaded with Brilinta 180 mg po x 1 in the cath lab. Will continue Brilinta 90 mg po BID. She is intolerant of ASA. The lesions in her vein graft to the obtuse marginal branches will need to be treated. Will also consider PCI of the native LAD which supplies the moderate caliber Diagonal branch. Will start a heparin drip 8 hours post sheath pull. Echo tomorrow. NTG drip. Plan PCI of the SVG to OM branches and possibly the proximal LAD on Monday. Continue beta blocker. She is statin intolerant. During the case, we had a STEMI activation for another patient. Since there is no acutely occluded vessel, her PCI will be delayed. She will be loaded with Brilinta today with PCI on Monday.   CT ANGIO CHEST AORTA W/CM & OR WO/CM  Result Date: 11/03/2019 CLINICAL DATA:  Chest pain, bundle branch block EXAM: CT ANGIOGRAPHY CHEST WITH CONTRAST TECHNIQUE: Multidetector CT imaging of the chest was performed using the standard protocol during bolus administration of intravenous contrast. Multiplanar CT image reconstructions and MIPs were obtained to evaluate the vascular  anatomy. CONTRAST:  130mL OMNIPAQUE IOHEXOL 350 MG/ML SOLN COMPARISON:  None. FINDINGS: Cardiovascular: Borderline  cardiomegaly. No pericardial effusion. Borderline dilated central pulmonary arteries. Satisfactory opacification of pulmonary arteries noted, and there is no evidence of pulmonary emboli. 3-vessel coronary calcifications. Previous CABG. Calcified atheromatous plaque throughout the descending thoracic aorta which is ectatic up to 3.1 cm diameter proximally. No dissection or stenosis. Classic 3 vessel brachiocephalic arterial origin anatomy without stenosis. Mediastinum/Nodes: No hilar or mediastinal adenopathy. Lungs/Pleura: No pleural effusion. No pneumothorax. Minimal dependent atelectasis/scarring posteriorly in both lungs. Upper Abdomen: Cholecystectomy clips. No acute findings. Musculoskeletal: Sternotomy wires. No fracture or worrisome bone lesion. Review of the MIP images confirms the above findings. IMPRESSION: Negative for acute PE or thoracic aortic dissection. Aortic Atherosclerosis (ICD10-I70.0) with 3.1 cm fusiform dilatation of the proximal descending thoracic segment. Electronically Signed   By: Lucrezia Europe M.D.   On: 11/03/2019 14:18   ECHOCARDIOGRAM COMPLETE  Result Date: 11/03/2019    ECHOCARDIOGRAM REPORT   Patient Name:   [name] Date of Exam: 11/03/2019 Medical Rec #:  [id number]      Height:       63.0 in Accession #:    [id number]     Weight:       147.0 lb Date of Birth:  1946/02/19      BSA:          1.696 m Patient Age:    64 years       BP:           146/70 mmHg Patient Gender: F              HR:           73 bpm. Exam Location:  Inpatient Procedure: 2D Echo Indications:    CAD Native Vessel 414.01 / I25.10  History:        Patient has prior history of Echocardiogram examinations. CAD                 and non-STEMI with severe three-vessel disease, Prior CABG,                 Arrythmias:LBBB; Risk Factors:Hypertension, Diabetes and                 Dyslipidemia. GERD.  Sonographer:    Darlina Sicilian RDCS Referring Phys: [place]  1. Left ventricular  ejection fraction, by estimation, is 65 to 70%. The left ventricle has normal function. The left ventricle has no regional wall motion abnormalities. There is mild asymmetric left ventricular hypertrophy. Left ventricular diastolic parameters are consistent with Grade I diastolic dysfunction (impaired relaxation).  2. Right ventricular systolic function is normal. The right ventricular size is normal. There is normal pulmonary artery systolic pressure.  3. Left atrial size was mildly dilated.  4. The mitral valve is normal in structure. No evidence of mitral valve regurgitation. No evidence of mitral stenosis.  5. The aortic valve is normal in structure. Aortic valve regurgitation is mild. No aortic stenosis is present. FINDINGS  Left Ventricle: Left ventricular ejection fraction, by estimation, is 65 to 70%. The left ventricle has normal function. The left ventricle has no regional wall motion abnormalities. The left ventricular internal cavity size was normal in size. There is  mild asymmetric left ventricular hypertrophy. Left ventricular diastolic parameters are consistent with Grade I diastolic dysfunction (impaired relaxation). Right Ventricle: The right ventricular size is normal. No increase in right ventricular wall thickness. Right  ventricular systolic function is normal. There is normal pulmonary artery systolic pressure. The tricuspid regurgitant velocity is 2.52 m/s, and  with an assumed right atrial pressure of 3 mmHg, the estimated right ventricular systolic pressure is [id number] mmHg. Left Atrium: Left atrial size was mildly dilated. Right Atrium: Right atrial size was normal in size. Pericardium: There is no evidence of pericardial effusion. Mitral Valve: The mitral valve is normal in structure. No evidence of mitral valve regurgitation. No evidence of mitral valve stenosis. Tricuspid Valve: The tricuspid valve is normal in structure. Tricuspid valve regurgitation is not demonstrated. No evidence of  tricuspid stenosis. Aortic Valve: The aortic valve is normal in structure. Aortic valve regurgitation is mild. Aortic regurgitation PHT measures 463 msec. No aortic stenosis is present. Pulmonic Valve: The pulmonic valve was normal in structure. Pulmonic valve regurgitation is not visualized. No evidence of pulmonic stenosis. Aorta: The aortic root and ascending aorta are structurally normal, with no evidence of dilitation. IAS/Shunts: The atrial septum is grossly normal.  LEFT VENTRICLE PLAX 2D LVIDd:         4.00 cm      Diastology LVIDs:         2.40 cm      LV e' lateral:   4.91 cm/s LV PW:         1.00 cm      LV E/e' lateral: 13.8 LV IVS:        1.30 cm      LV e' medial:    3.24 cm/s LVOT diam:     1.90 cm      LV E/e' medial:  20.9 LV SV:         48 LV SV Index:   28 LVOT Area:     2.84 cm  LV Volumes (MOD) LV vol d, MOD A2C: 131.0 ml LV vol d, MOD A4C: 111.0 ml LV vol s, MOD A2C: 52.7 ml LV vol s, MOD A4C: 56.4 ml LV SV MOD A2C:     78.3 ml LV SV MOD A4C:     111.0 ml LV SV MOD BP:      66.2 ml RIGHT VENTRICLE RV S prime:     9.81 cm/s TAPSE (M-mode): 1.7 cm LEFT ATRIUM             Index LA diam:        3.70 cm 2.18 cm/m LA Vol (A2C):   56.5 ml 33.30 ml/m LA Vol (A4C):   63.4 ml 37.37 ml/m LA Biplane Vol: 61.0 ml 35.96 ml/m  AORTIC VALVE LVOT Vmax:   83.60 cm/s LVOT Vmean:  58.600 cm/s LVOT VTI:    0.170 m AI PHT:      463 msec  AORTA Ao Root diam: 2.80 cm MITRAL VALVE                TRICUSPID VALVE MV Area (PHT): 4.21 cm     TR Peak grad:   25.4 mmHg MV Decel Time: 180 msec     TR Vmax:        252.00 cm/s MV E velocity: 67.70 cm/s MV A velocity: 117.00 cm/s  SHUNTS MV E/A ratio:  0.58         Systemic VTI:  0.17 m                             Systemic Diam: 1.90 cm Mertie Moores MD Electronically signed by Mertie Moores MD  Signature Date/Time: 11/03/2019/3:56:18 PM    Final     Cardiac Studies  LHC 11/02/2019 1. Severe triple vessel CAD s/p 4V CABG with 4/4 patent bypass grafts.  2. The native LAD  has a severe stenosis just before the Diagonal branch and is then occluded in the mid segment. The LIMA is patent to the LAD.  3. The native Circumflex is occluded in the mid segment. The first and second OM branches are supplied by the patent vein graft. The vein graft has severe proximal, mid and distal body stenosis.  4. The native RCA is a small to moderate caliber vessel with severe proximal stenosis before a RV marginal branch and then total chronic mid occlusion. The vein graft to the distal RCA branches is patent. The mid body of the vein graft has a patent stent with mild to moderate restenosis.   TTE 11/03/2019 1. Left ventricular ejection fraction, by estimation, is 65 to 70%. The  left ventricle has normal function. The left ventricle has no regional  wall motion abnormalities. There is mild asymmetric left ventricular  hypertrophy. Left ventricular diastolic  parameters are consistent with Grade I diastolic dysfunction (impaired  relaxation).  2. Right ventricular systolic function is normal. The right ventricular  size is normal. There is normal pulmonary artery systolic pressure.  3. Left atrial size was mildly dilated.  4. The mitral valve is normal in structure. No evidence of mitral valve  regurgitation. No evidence of mitral stenosis.  5. The aortic valve is normal in structure. Aortic valve regurgitation is  mild. No aortic stenosis is present.   Patient Profile  [name] is a 74 y.o. female with diabetes, CAD status post CABG, hyperlipidemia, GERD who was admitted on 11/02/2019 for non-STEMI.  Assessment & Plan   1.  Non-STEMI -Admitted with 1 day of chest pain.  Pain did not go away.  Troponin values have peaked at 12,000 and are trending down.  EKG with a bundle branch block. Was taken to Cath Lab on 11/02/2019.  She has 100% occlusion of her mid LAD, proximal left circumflex, proximal RCA. LIMA to LAD is patent, vein graft to distal RCA is patent, vein graft  to OM 1/2 has a severe proximal stenosis as well as moderate mid and distal stenoses.  Good flow was noted during the procedure. She also has a 95% proximal LAD stenosis that supplies a large diagonal branch. -CT PE study negative.  -Left radial cath site looks clean and dry without hematoma.  Right femoral cath site looks good as well. -I suspect we will intervene on the vein graft OM versus the LAD that supplies a large diagonal branch.  I think a stent to the proximal LAD would not compromise the LIMA to LAD as she has had a percent occlusion of the mid LAD.  Will have her on for PCI tomorrow and will have interventional rounder evaluate in AM.  -For now she is chest pain-free. NTG drip off -Echo with preserved EF and no WMA -continue ASA, brilinta, atenolol  -She is statin intolerant. Continue zetia. She will need to be evaluated for PCSK9 inhibitors as outpatient -tele stable. CP free. Plan to transfer to floor today. Tele bed.   2. HTN -Continue home beta-blocker.  Continue home clonidine.  3.  Diabetes -A1c is 8.0 -Sliding scale insulin while in-house -I will hold her oral hypoglycemic agents while she is here.  We will consider an SGLT2 inhibitor given her CAD at discharge  4.  Hyperlipidemia -Statin intolerant.  Zetia. Should be considered for PCSK9 inhibitors outpatient.  FEN -no IVF -DVT ppx: heparin -diet: general -code: full   For questions or updates, please contact [place] Please consult [email or web address] for contact info under   Time Spent with Patient: I have spent a total of 25 minutes with patient reviewing hospital notes, telemetry, EKGs, labs and examining the patient as well as establishing an assessment and plan that was discussed with the patient.  > 50% of time was spent in direct patient care.    Signed, Addison Naegeli. Audie Box, [place]  11/04/2019 7:28 AM

## 2019-11-04 NOTE — Progress Notes (Signed)
Notified cardiology on call, Truby MD, regarding pt's potassium of 3.5 this morning. 40 mg po one time verbal order given. See MAR.

## 2019-11-04 NOTE — Progress Notes (Signed)
[place] for Heparin Indication: chest pain/ACS>>rule out PE  Allergies  Allergen Reactions  . Aspirin Nausea Only    Patient Measurements: Height: 5\' 3"  (160 cm) Weight: 142 lb 3.2 oz (64.5 kg) IBW/kg (Calculated) : 52.4 Heparin Dosing Weight:66 kg  Vital Signs: Temp: 97.9 F (36.6 C) (03/28 0700) Temp Source: Oral (03/28 0700) BP: 131/70 (03/28 0906) Pulse Rate: 78 (03/28 0906)  Labs: Recent Labs    11/02/19 1535 11/02/19 1928 11/03/19 0217 11/03/19 0733 11/03/19 0936 11/03/19 0936 11/03/19 2009 11/04/19 0130 11/04/19 1010  HGB 13.3   < > 12.3  --   --   --   --  13.0  --   HCT 38.0  --  35.1*  --   --   --   --  37.2  --   PLT 299  --  249  --   --   --   --  239  --   HEPARINUNFRC  --   --   --   --  <0.10*   < > 0.57 0.86* 0.67  CREATININE 0.77  --  0.71  --   --   --   --  0.56  --   TROPONINIHS 1,701*   < > 12,407* 9,324* 7,882*  --   --   --   --    < > = values in this interval not displayed.    Estimated Creatinine Clearance: 55.7 mL/min (by C-G formula based on SCr of 0.56 mg/dL).   Medical History: Past Medical History:  Diagnosis Date  . Acute myocardial infarction, unspecified site, episode of care unspecified   . Coronary artery disease status post CABG 2005    a. s/p CABG 2005. b. 2011 - DES of SVG to PDA. c. Admitted with CP 03/2012 with stable anatomy by cath, felt noncardiac pain.  . Depression   . Diabetes mellitus   . Elevated LFTs   . Elevated transaminase level    Most recently October 2012 most recent  . Esophageal reflux   . GERD (gastroesophageal reflux disease)   . Hernia   . History of cholecystectomy   . Hyperlipidemia    a. Not on statin due to elevated liver enzymes.  . Hypertension   . Kidney cysts    left   Assessment: CC/HPI: CP  PMH: CAD s/p CABG x 4 in 2005, PCI, DM, HLD, HTN, GERD, depression, elevated LFT's, hernia, L kidney cyst  Anticoag: ongoing chest pain with new  LBBB and elevated troponin. Patent bypass grafts with flow on cath. Unsure of cause of CP. Will plan IV heparin tonight and then a chest CTA tomorrow to exclude PE. IF r/o PE, the needs PCI. - 3/26: 1825 femoral sheath out. Scr 0.77. CBC WNL  3/26 cath:Severe triple vessel CAD s/p 4V CABG with 4/4 patent bypass grafts. Unsure cause of CP. IF PE excluded, the lesions in her vein graft to the obtuse marginal branches will need to be treated if there is no other cause for her chest pain and elevated troponin. Will also consider PCI of the native LAD  CT negative for PE  Heparin level at goal this am at 0.67. Planning for possible PCI in am. CBC stable, no bleeding noted.   Goal of Therapy:  Heparin level 0.3-0.7 units/ml Monitor platelets by anticoagulation protocol: Yes   Plan:  Decrease heparin to 1100/hr Daily heparin level and CBC  Erin Hearing PharmD., BCPS Clinical Pharmacist 11/04/2019 10:54 AM

## 2019-11-05 ENCOUNTER — Encounter (HOSPITAL_COMMUNITY)
Admission: EM | Disposition: A | Discharge status: Home / Self Care | Payer: Self-pay | Source: Home / Self Care | Attending: Cardiovascular Disease

## 2019-11-05 ENCOUNTER — Encounter: Payer: Self-pay | Admitting: Cardiology

## 2019-11-05 HISTORY — PX: INTRAVASCULAR ULTRASOUND/IVUS: CATH118244

## 2019-11-05 HISTORY — PX: CORONARY STENT INTERVENTION: CATH118234

## 2019-11-05 LAB — POCT ACTIVATED CLOTTING TIME
Activated Clotting Time: 263 seconds
Activated Clotting Time: 323 seconds
Activated Clotting Time: 180 seconds

## 2019-11-05 LAB — CBC
HCT: 36.2 % (ref 36.0–46.0)
Hemoglobin: 12.9 g/dL (ref 12.0–15.0)
MCH: 32 pg (ref 26.0–34.0)
MCHC: 35.6 g/dL (ref 30.0–36.0)
MCV: 89.8 fL (ref 80.0–100.0)
Platelets: 241 10*3/uL (ref 150–400)
RBC: 4.03 MIL/uL (ref 3.87–5.11)
RDW: 12 % (ref 11.5–15.5)
WBC: 6.2 10*3/uL (ref 4.0–10.5)
nRBC: 0 % (ref 0.0–0.2)

## 2019-11-05 LAB — GLUCOSE, CAPILLARY
Glucose-Capillary: 328 mg/dL — ABNORMAL HIGH (ref 70–99)
Glucose-Capillary: 298 mg/dL — ABNORMAL HIGH (ref 70–99)
Glucose-Capillary: 218 mg/dL — ABNORMAL HIGH (ref 70–99)
Glucose-Capillary: 283 mg/dL — ABNORMAL HIGH (ref 70–99)
Glucose-Capillary: 271 mg/dL — ABNORMAL HIGH (ref 70–99)

## 2019-11-05 LAB — BASIC METABOLIC PANEL
Anion gap: 12 (ref 5–15)
BUN: 13 mg/dL (ref 8–23)
CO2: 24 mmol/L (ref 22–32)
Calcium: 9.6 mg/dL (ref 8.9–10.3)
Chloride: 98 mmol/L (ref 98–111)
Creatinine, Ser: 0.78 mg/dL (ref 0.44–1.00)
GFR calc Af Amer: >60 mL/min (ref >60)
GFR calc non Af Amer: >60 mL/min (ref >60)
Glucose, Bld: 328 mg/dL — ABNORMAL HIGH (ref 70–99)
Potassium: 3.8 mmol/L (ref 3.5–5.1)
Sodium: 134 mmol/L — ABNORMAL LOW (ref 135–145)

## 2019-11-05 LAB — HEPARIN LEVEL (UNFRACTIONATED): Heparin Unfractionated: 0.6 IU/mL (ref 0.30–0.70)

## 2019-11-05 SURGERY — CORONARY STENT INTERVENTION
Anesthesia: LOCAL

## 2019-11-05 MED ORDER — HYDRALAZINE HCL 20 MG/ML IJ SOLN: 10.0000 mg | INTRAMUSCULAR | Status: AC | PRN

## 2019-11-05 MED ORDER — SODIUM CHLORIDE 0.9 % IV SOLN: 250.0000 mL | INTRAVENOUS | Status: DC | PRN

## 2019-11-05 MED ORDER — ACETAMINOPHEN 325 MG PO TABS
650.0000 mg | ORAL_TABLET | ORAL | Status: DC | PRN
  Administered 2019-11-05 – 2019-11-06 (×2): 650 mg via ORAL
  Filled 2019-11-05 (×2): qty 2

## 2019-11-05 MED ORDER — SODIUM CHLORIDE 0.9% FLUSH
3.0000 mL | Freq: Two times a day (BID) | INTRAVENOUS | Status: DC
  Administered 2019-11-05 – 2019-11-06 (×3): 3 mL via INTRAVENOUS

## 2019-11-05 MED ORDER — LABETALOL HCL 5 MG/ML IV SOLN: 10.0000 mg | INTRAVENOUS | Status: AC | PRN

## 2019-11-05 MED ORDER — VERAPAMIL HCL 2.5 MG/ML IV SOLN
INTRAVENOUS | Status: AC
  Filled 2019-11-05: qty 2

## 2019-11-05 MED ORDER — ONDANSETRON HCL 4 MG/2ML IJ SOLN: 4.0000 mg | Freq: Four times a day (QID) | INTRAMUSCULAR | Status: DC | PRN

## 2019-11-05 MED ORDER — PANTOPRAZOLE SODIUM 40 MG PO TBEC
40.0000 mg | DELAYED_RELEASE_TABLET | Freq: Every day | ORAL | Status: DC
  Administered 2019-11-05 – 2019-11-07 (×3): 40 mg via ORAL
  Filled 2019-11-05 (×3): qty 1

## 2019-11-05 MED ORDER — SODIUM CHLORIDE 0.9% FLUSH: 3.0000 mL | INTRAVENOUS | Status: DC | PRN

## 2019-11-05 MED ORDER — SODIUM CHLORIDE 0.9 % IV SOLN: INTRAVENOUS | Status: AC

## 2019-11-05 MED ORDER — SODIUM CHLORIDE 0.9 % WEIGHT BASED INFUSION
3.0000 mL/kg/h | INTRAVENOUS | Status: DC
  Administered 2019-11-05: 06:00:00 3 mL/kg/h via INTRAVENOUS

## 2019-11-05 MED ORDER — LIDOCAINE HCL (PF) 1 % IJ SOLN
INTRAMUSCULAR | Status: AC
  Filled 2019-11-05: qty 30

## 2019-11-05 MED ORDER — FENTANYL CITRATE (PF) 100 MCG/2ML IJ SOLN
INTRAMUSCULAR | Status: DC | PRN
  Administered 2019-11-05: 25 ug via INTRAVENOUS

## 2019-11-05 MED ORDER — LISINOPRIL 10 MG PO TABS
10.0000 mg | ORAL_TABLET | Freq: Every day | ORAL | Status: DC
  Administered 2019-11-06 – 2019-11-07 (×2): 10 mg via ORAL
  Filled 2019-11-05 (×2): qty 1

## 2019-11-05 MED ORDER — HEPARIN SODIUM (PORCINE) 1000 UNIT/ML IJ SOLN
INTRAMUSCULAR | Status: AC
  Filled 2019-11-05: qty 1

## 2019-11-05 MED ORDER — ASPIRIN 81 MG PO CHEW
81.0000 mg | CHEWABLE_TABLET | Freq: Every day | ORAL | Status: DC
  Administered 2019-11-06 – 2019-11-07 (×2): 81 mg via ORAL
  Filled 2019-11-05 (×2): qty 1

## 2019-11-05 MED ORDER — AMLODIPINE BESYLATE 5 MG PO TABS: 5.0000 mg | ORAL_TABLET | Freq: Every day | ORAL | Status: DC

## 2019-11-05 MED ORDER — SODIUM CHLORIDE 0.9 % WEIGHT BASED INFUSION
1.0000 mL/kg/h | INTRAVENOUS | Status: DC
  Administered 2019-11-05: 07:00:00 1 mL/kg/h via INTRAVENOUS

## 2019-11-05 MED ORDER — FENTANYL CITRATE (PF) 100 MCG/2ML IJ SOLN
INTRAMUSCULAR | Status: AC
  Filled 2019-11-05: qty 2

## 2019-11-05 MED ORDER — HEPARIN (PORCINE) IN NACL 1000-0.9 UT/500ML-% IV SOLN
INTRAVENOUS | Status: AC
  Filled 2019-11-05: qty 1000

## 2019-11-05 MED ORDER — MIDAZOLAM HCL 2 MG/2ML IJ SOLN
INTRAMUSCULAR | Status: AC
  Filled 2019-11-05: qty 2

## 2019-11-05 MED ORDER — SODIUM CHLORIDE 0.9% FLUSH
3.0000 mL | Freq: Two times a day (BID) | INTRAVENOUS | Status: DC
  Administered 2019-11-05: 09:00:00 3 mL via INTRAVENOUS

## 2019-11-05 MED ORDER — HEPARIN SODIUM (PORCINE) 1000 UNIT/ML IJ SOLN
INTRAMUSCULAR | Status: DC | PRN
  Administered 2019-11-05: 8000 [IU] via INTRAVENOUS

## 2019-11-05 MED ORDER — IOHEXOL 350 MG/ML SOLN
INTRAVENOUS | Status: AC
  Filled 2019-11-05: qty 1

## 2019-11-05 MED ORDER — TICAGRELOR 90 MG PO TABS
90.0000 mg | ORAL_TABLET | Freq: Two times a day (BID) | ORAL | Status: DC
  Administered 2019-11-05 – 2019-11-07 (×4): 90 mg via ORAL
  Filled 2019-11-05 (×4): qty 1

## 2019-11-05 MED ORDER — IOHEXOL 350 MG/ML SOLN
INTRAVENOUS | Status: DC | PRN
  Administered 2019-11-05: 80 mL

## 2019-11-05 MED ORDER — HEPARIN (PORCINE) IN NACL 1000-0.9 UT/500ML-% IV SOLN
INTRAVENOUS | Status: DC | PRN
  Administered 2019-11-05 (×2): 500 mL

## 2019-11-05 MED ORDER — AMLODIPINE BESYLATE 10 MG PO TABS
10.0000 mg | ORAL_TABLET | Freq: Every day | ORAL | Status: DC
  Administered 2019-11-05 – 2019-11-07 (×3): 10 mg via ORAL
  Filled 2019-11-05 (×3): qty 1

## 2019-11-05 MED ORDER — MIDAZOLAM HCL 2 MG/2ML IJ SOLN
INTRAMUSCULAR | Status: DC | PRN
  Administered 2019-11-05: 2 mg via INTRAVENOUS

## 2019-11-05 MED ORDER — VERAPAMIL HCL 2.5 MG/ML IV SOLN
INTRAVENOUS | Status: DC | PRN
  Administered 2019-11-05 (×3): 200 ug via INTRACORONARY

## 2019-11-05 MED ORDER — LINAGLIPTIN 5 MG PO TABS
5.0000 mg | ORAL_TABLET | Freq: Every day | ORAL | Status: DC
  Administered 2019-11-06 – 2019-11-07 (×2): 5 mg via ORAL
  Filled 2019-11-05 (×2): qty 1

## 2019-11-05 MED ORDER — ASPIRIN 81 MG PO CHEW
81.0000 mg | CHEWABLE_TABLET | ORAL | Status: AC
  Administered 2019-11-05: 08:00:00 81 mg via ORAL
  Filled 2019-11-05: qty 1

## 2019-11-05 MED ORDER — LIDOCAINE HCL (PF) 1 % IJ SOLN
INTRAMUSCULAR | Status: DC | PRN
  Administered 2019-11-05: 15 mL

## 2019-11-05 MED FILL — Verapamil HCl IV Soln 2.5 MG/ML: INTRAVENOUS | Qty: 2 | Status: AC

## 2019-11-05 SURGICAL SUPPLY — 21 items
BALLN SAPPHIRE 2.0X15 (BALLOONS) ×2
BALLN SAPPHIRE ~~LOC~~ 3.75X8 (BALLOONS) ×1 IMPLANT
BALLOON SAPPHIRE 2.0X15 (BALLOONS) IMPLANT
CATH LAUNCHER 6FR AL1 (CATHETERS) IMPLANT
CATH OPTICROSS 40MHZ (CATHETERS) ×1 IMPLANT
CATHETER LAUNCHER 6FR AL1 (CATHETERS) ×2
DEVICE CONTINUOUS FLUSH (MISCELLANEOUS) ×1 IMPLANT
KIT ENCORE 26 ADVANTAGE (KITS) ×1 IMPLANT
KIT HEART LEFT (KITS) ×2 IMPLANT
KIT HEMO VALVE WATCHDOG (MISCELLANEOUS) ×1 IMPLANT
PACK CARDIAC CATHETERIZATION (CUSTOM PROCEDURE TRAY) ×2 IMPLANT
SHEATH PINNACLE 6F 10CM (SHEATH) ×1 IMPLANT
SHEATH PROBE COVER 6X72 (BAG) ×1 IMPLANT
SLED PULL BACK IVUS (MISCELLANEOUS) ×1 IMPLANT
STENT SYNERGY XD 3.50X12 (Permanent Stent) IMPLANT
STENT SYNERGY XD 3.50X28 (Permanent Stent) IMPLANT
SYNERGY XD 3.50X12 (Permanent Stent) ×2 IMPLANT
SYNERGY XD 3.50X28 (Permanent Stent) ×2 IMPLANT
TRANSDUCER W/STOPCOCK (MISCELLANEOUS) ×2 IMPLANT
TUBING CIL FLEX 10 FLL-RA (TUBING) ×2 IMPLANT
WIRE ASAHI PROWATER 180CM (WIRE) ×1 IMPLANT

## 2019-11-05 NOTE — Progress Notes (Signed)
[place] for Heparin Indication: chest pain/ACS  Allergies  Allergen Reactions  . Aspirin Nausea Only    Patient Measurements: Height: 5\' 3"  (160 cm) Weight: 143 lb 8.3 oz (65.1 kg) IBW/kg (Calculated) : 52.4 Heparin Dosing Weight:66 kg  Vital Signs: Temp: 98.5 F (36.9 C) (03/29 0740) Temp Source: Oral (03/29 0740) BP: 134/67 (03/29 1000) Pulse Rate: 68 (03/29 1000)  Labs: Recent Labs     0000 11/03/19 0217 11/03/19 0733 11/03/19 0936 11/03/19 2009 11/04/19 0130 11/04/19 1010 11/05/19 0442  HGB   < > 12.3  --   --   --  13.0  --  12.9  HCT  --  35.1*  --   --   --  37.2  --  36.2  PLT  --  249  --   --   --  239  --  241  HEPARINUNFRC  --   --   --  <0.10*   < > 0.86* 0.67 0.60  CREATININE  --  0.71  --   --   --  0.56  --  0.78  TROPONINIHS  --  12,407* 9,324* 7,882*  --   --   --   --    < > = values in this interval not displayed.    Estimated Creatinine Clearance: 56 mL/min (by C-G formula based on SCr of 0.78 mg/dL).   Medical History: Past Medical History:  Diagnosis Date  . Acute myocardial infarction, unspecified site, episode of care unspecified   . Coronary artery disease status post CABG 2005    a. s/p CABG 2005. b. 2011 - DES of SVG to PDA. c. Admitted with CP 03/2012 with stable anatomy by cath, felt noncardiac pain.  . Depression   . Diabetes mellitus   . Elevated LFTs   . Elevated transaminase level    Most recently October 2012 most recent  . Esophageal reflux   . GERD (gastroesophageal reflux disease)   . Hernia   . History of cholecystectomy   . Hyperlipidemia    a. Not on statin due to elevated liver enzymes.  . Hypertension   . Kidney cysts    left   Assessment: CC/HPI: CP  PMH: CAD s/p CABG x 4 in 2005, PCI, DM, HLD, HTN, GERD, depression, elevated LFT's, hernia, L kidney cyst  Anticoag: ongoing chest pain with new LBBB and elevated troponin. Patent bypass grafts with flow on cath.  Unsure of cause of CP. Will plan IV heparin tonight and then a chest CTA tomorrow to exclude PE. IF r/o PE, the needs PCI. - 3/26: 1825 femoral sheath out. Scr 0.77. CBC WNL  3/26 cath:Severe triple vessel CAD s/p 4V CABG with 4/4 patent bypass grafts. Unsure cause of CP. IF PE excluded, the lesions in her vein graft to the obtuse marginal branches will need to be treated if there is no other cause for her chest pain and elevated troponin. Will also consider PCI of the native LAD  CT negative for PE  Heparin level at goal this am at 0.60. Planning cath/pci today. CBC stable, no bleeding noted.   Goal of Therapy:  Heparin level 0.3-0.7 units/ml Monitor platelets by anticoagulation protocol: Yes   Plan:  Continue heparin at 1100/hr Daily heparin level and CBC  Erin Hearing PharmD., BCPS Clinical Pharmacist 11/05/2019 10:39 AM

## 2019-11-05 NOTE — Progress Notes (Signed)
Inpatient Diabetes Program Recommendations  AACE/ADA: New Consensus Statement on Inpatient Glycemic Control (2015)  Target Ranges:  Prepandial:   less than 140 mg/dL      Peak postprandial:   less than 180 mg/dL (1-2 hours)      Critically ill patients:  140 - 180 mg/dL   Results for DEQUANA, PELKY (MRN [id number]) as of 11/05/2019 09:22  Ref. Range 11/04/2019 06:39 11/04/2019 11:49 11/04/2019 15:29 11/04/2019 21:15  Glucose-Capillary Latest Ref Range: 70 - 99 mg/dL 280 (H)  8 units NOVOLOG  342 (H)  11 units NOVOLOG  218 (H)  5 units NOVOLOG  322 (H)  4 units NOVOLOG    Results for LABRISHA, EDGETT (MRN [id number]) as of 11/05/2019 09:22  Ref. Range 11/05/2019 06:42 11/05/2019 07:52  Glucose-Capillary Latest Ref Range: 70 - 99 mg/dL 328 (H) 298 (H)  8 units NOVOLOG    Results for SHANDALYN, MUNSEN (MRN [id number]) as of 11/05/2019 09:22  Ref. Range 11/03/2019 07:33  Hemoglobin A1C Latest Ref Range: 4.8 - 5.6 % 8.1 (H)  (185 mg/dl)    Admit with: NSTEMI  History: DM, CABG  Home DM Meds: Glipizide 20 mg Daily       Tradjenta 5 mg Daily  Current Orders: Novolog Moderate Correction Scale/ SSI (0-15 units) TID AC + HS    Needs follow up for better diabetes control after d/c with PCP Dr. Judd Lien [facility] [place]  NPO this AM.    MD- Note severely elevated CBGs.  Please consider the following while home oral meds are on hold:  1. Start Lantus 13 units Daily (0.2 units/kg)  2. Start Novolog Meal Coverage: Novolog 3 units TID with meals  (Please add the following Hold Parameters: Hold if pt eats <50% of meal, Hold if pt NPO)    --Will follow patient during hospitalization--  Wyn Quaker RN, MSN, CDE Diabetes Coordinator Inpatient Glycemic Control Team Team Pager: [phone] (8a-5p)

## 2019-11-05 NOTE — H&P (View-Only) (Signed)
Progress Note  Patient Name: ENIS BOEHNING Date of Encounter: 11/05/2019  Primary Cardiologist: Dr. Stanford Breed  Subjective   No recurrent chest pain  Inpatient Medications    Scheduled Meds: . aspirin  324 mg Oral Once  . aspirin  81 mg Oral Daily  . aspirin  81 mg Oral Pre-Cath  . atenolol  75 mg Oral Daily  . Chlorhexidine Gluconate Cloth  6 each Topical Daily  . cloNIDine  0.2 mg Oral QHS  . docusate sodium  100 mg Oral BID  . ezetimibe  10 mg Oral Daily  . furosemide  40 mg Oral Daily  . insulin aspart  0-15 Units Subcutaneous TID WC  . insulin aspart  0-5 Units Subcutaneous QHS  . PARoxetine  40 mg Oral q morning - 10a  . sodium chloride flush  3 mL Intravenous Q12H  . sodium chloride flush  3 mL Intravenous Q12H  . ticagrelor  90 mg Oral BID   Continuous Infusions: . sodium chloride Stopped (11/04/19 1643)  . sodium chloride    . sodium chloride 1 mL/kg/hr (11/05/19 0717)  . heparin 1,100 Units/hr (11/05/19 0700)  . nitroGLYCERIN Stopped (11/03/19 1130)   PRN Meds: sodium chloride, sodium chloride, acetaminophen, bisacodyl, nitroGLYCERIN, ondansetron (ZOFRAN) IV, oxyCODONE, sodium chloride flush, sodium chloride flush   Vital Signs    Vitals:   11/05/19 0600 11/05/19 0700 11/05/19 0740 11/05/19 0800  BP: (!) 128/56 127/65  136/73  Pulse: 72 74  68  Resp: (!) 24 (!) 21  16  Temp:   98.5 F (36.9 C)   TempSrc:   Oral   SpO2: 94% 96%  98%  Weight:      Height:        Intake/Output Summary (Last 24 hours) at 11/05/2019 0806 Last data filed at 11/05/2019 0700 Gross per 24 hour  Intake 728.85 ml  Output 2100 ml  Net -1371.15 ml    I/O since admission: -3873  Filed Weights   11/02/19 1522 11/04/19 0400 11/05/19 0431  Weight: 66.7 kg 64.5 kg 65.1 kg    Telemetry    Sinus rhythm - Personally Reviewed  ECG    ECG (independently read by me): NSR at 77; LBBB  Physical Exam   BP 136/73   Pulse 68   Temp 98.5 F (36.9 C) (Oral)   Resp 16    Ht 5\' 3"  (1.6 m)   Wt 65.1 kg   SpO2 98%   BMI 25.42 kg/m  General: Alert, oriented, no distress.  Skin: normal turgor, no rashes, warm and dry HEENT: Normocephalic, atraumatic. Pupils equal round and reactive to light; sclera anicteric; extraocular muscles intact;  Nose without nasal septal hypertrophy Mouth/Parynx benign; Mallinpatti scale 3 Neck: No JVD, no carotid bruits; normal carotid upstroke Lungs: clear to ausculatation and percussion; no wheezing or rales Chest wall: without tenderness to palpitation Heart: PMI not displaced, RRR, s1 s2 normal, 1/6 systolic murmur, no diastolic murmur, no rubs, gallops, thrills, or heaves Abdomen: soft, nontender; no hepatosplenomehaly, BS+; abdominal aorta nontender and not dilated by palpation. Back: no CVA tenderness Pulses 2+; R groin cath site stable Musculoskeletal: full range of motion, normal strength, no joint deformities Extremities: no clubbing cyanosis or edema, Homan's sign negative  Neurologic: grossly nonfocal; Cranial nerves grossly wnl Psychologic: Normal mood and affect   Labs    Chemistry Recent Labs  Lab 11/03/19 0217 11/04/19 0130 11/05/19 0442  NA 134* 135 134*  K 3.9 3.5 3.8  CL 99 98  98  CO2 23 25 24   GLUCOSE 344* 287* 328*  BUN 13 11 13   CREATININE 0.71 0.56 0.78  CALCIUM 9.5 9.6 9.6  GFRNONAA >60 >60 >60  GFRAA >60 >60 >60  ANIONGAP 12 12 12      Hematology Recent Labs  Lab 11/03/19 0217 11/04/19 0130 11/05/19 0442  WBC 5.9 5.4 6.2  RBC 3.89 4.14 4.03  HGB 12.3 13.0 12.9  HCT 35.1* 37.2 36.2  MCV 90.2 89.9 89.8  MCH 31.6 31.4 32.0  MCHC 35.0 34.9 35.6  RDW 12.4 12.0 12.0  PLT 249 239 241    HS TROP: 1701 > 8299 > 11761 > 12407 > 9324 >7882  BNPNo results for input(s): BNP, PROBNP in the last 168 hours.   DDimer No results for input(s): DDIMER in the last 168 hours.   Lipid Panel     Component Value Date/Time   CHOL 196 11/04/2019 0130   TRIG 392 (H) 11/04/2019 0130   HDL 35 (L)  11/04/2019 0130   CHOLHDL 5.6 11/04/2019 0130   VLDL 78 (H) 11/04/2019 0130   LDLCALC 83 11/04/2019 0130   LDLDIRECT 130.6 06/07/2011 0842     Radiology    CT ANGIO CHEST AORTA W/CM & OR WO/CM  Result Date: 11/03/2019 CLINICAL DATA:  Chest pain, bundle branch block EXAM: CT ANGIOGRAPHY CHEST WITH CONTRAST TECHNIQUE: Multidetector CT imaging of the chest was performed using the standard protocol during bolus administration of intravenous contrast. Multiplanar CT image reconstructions and MIPs were obtained to evaluate the vascular anatomy. CONTRAST:  150mL OMNIPAQUE IOHEXOL 350 MG/ML SOLN COMPARISON:  None. FINDINGS: Cardiovascular: Borderline cardiomegaly. No pericardial effusion. Borderline dilated central pulmonary arteries. Satisfactory opacification of pulmonary arteries noted, and there is no evidence of pulmonary emboli. 3-vessel coronary calcifications. Previous CABG. Calcified atheromatous plaque throughout the descending thoracic aorta which is ectatic up to 3.1 cm diameter proximally. No dissection or stenosis. Classic 3 vessel brachiocephalic arterial origin anatomy without stenosis. Mediastinum/Nodes: No hilar or mediastinal adenopathy. Lungs/Pleura: No pleural effusion. No pneumothorax. Minimal dependent atelectasis/scarring posteriorly in both lungs. Upper Abdomen: Cholecystectomy clips. No acute findings. Musculoskeletal: Sternotomy wires. No fracture or worrisome bone lesion. Review of the MIP images confirms the above findings. IMPRESSION: Negative for acute PE or thoracic aortic dissection. Aortic Atherosclerosis (ICD10-I70.0) with 3.1 cm fusiform dilatation of the proximal descending thoracic segment. Electronically Signed   By: Lucrezia Europe M.D.   On: 11/03/2019 14:18   ECHOCARDIOGRAM COMPLETE  Result Date: 11/03/2019    ECHOCARDIOGRAM REPORT   Patient Name:   DENNISSE AVERBECK Date of Exam: 11/03/2019 Medical Rec #:  [id number]      Height:       63.0 in Accession #:    [id number]      Weight:       147.0 lb Date of Birth:  1946/06/24      BSA:          1.696 m Patient Age:    64 years       BP:           146/70 mmHg Patient Gender: F              HR:           73 bpm. Exam Location:  Inpatient Procedure: 2D Echo Indications:    CAD Native Vessel 414.01 / I25.10  History:        Patient has prior history of Echocardiogram examinations. CAD  and non-STEMI with severe three-vessel disease, Prior CABG,                 Arrythmias:LBBB; Risk Factors:Hypertension, Diabetes and                 Dyslipidemia. GERD.  Sonographer:    Darlina Sicilian RDCS Referring Phys: [place]  1. Left ventricular ejection fraction, by estimation, is 65 to 70%. The left ventricle has normal function. The left ventricle has no regional wall motion abnormalities. There is mild asymmetric left ventricular hypertrophy. Left ventricular diastolic parameters are consistent with Grade I diastolic dysfunction (impaired relaxation).  2. Right ventricular systolic function is normal. The right ventricular size is normal. There is normal pulmonary artery systolic pressure.  3. Left atrial size was mildly dilated.  4. The mitral valve is normal in structure. No evidence of mitral valve regurgitation. No evidence of mitral stenosis.  5. The aortic valve is normal in structure. Aortic valve regurgitation is mild. No aortic stenosis is present. FINDINGS  Left Ventricle: Left ventricular ejection fraction, by estimation, is 65 to 70%. The left ventricle has normal function. The left ventricle has no regional wall motion abnormalities. The left ventricular internal cavity size was normal in size. There is  mild asymmetric left ventricular hypertrophy. Left ventricular diastolic parameters are consistent with Grade I diastolic dysfunction (impaired relaxation). Right Ventricle: The right ventricular size is normal. No increase in right ventricular wall thickness. Right ventricular systolic  function is normal. There is normal pulmonary artery systolic pressure. The tricuspid regurgitant velocity is 2.52 m/s, and  with an assumed right atrial pressure of 3 mmHg, the estimated right ventricular systolic pressure is [id number] mmHg. Left Atrium: Left atrial size was mildly dilated. Right Atrium: Right atrial size was normal in size. Pericardium: There is no evidence of pericardial effusion. Mitral Valve: The mitral valve is normal in structure. No evidence of mitral valve regurgitation. No evidence of mitral valve stenosis. Tricuspid Valve: The tricuspid valve is normal in structure. Tricuspid valve regurgitation is not demonstrated. No evidence of tricuspid stenosis. Aortic Valve: The aortic valve is normal in structure. Aortic valve regurgitation is mild. Aortic regurgitation PHT measures 463 msec. No aortic stenosis is present. Pulmonic Valve: The pulmonic valve was normal in structure. Pulmonic valve regurgitation is not visualized. No evidence of pulmonic stenosis. Aorta: The aortic root and ascending aorta are structurally normal, with no evidence of dilitation. IAS/Shunts: The atrial septum is grossly normal.  LEFT VENTRICLE PLAX 2D LVIDd:         4.00 cm      Diastology LVIDs:         2.40 cm      LV e' lateral:   4.91 cm/s LV PW:         1.00 cm      LV E/e' lateral: 13.8 LV IVS:        1.30 cm      LV e' medial:    3.24 cm/s LVOT diam:     1.90 cm      LV E/e' medial:  20.9 LV SV:         48 LV SV Index:   28 LVOT Area:     2.84 cm  LV Volumes (MOD) LV vol d, MOD A2C: 131.0 ml LV vol d, MOD A4C: 111.0 ml LV vol s, MOD A2C: 52.7 ml LV vol s, MOD A4C: 56.4 ml LV SV MOD A2C:     78.3  ml LV SV MOD A4C:     111.0 ml LV SV MOD BP:      66.2 ml RIGHT VENTRICLE RV S prime:     9.81 cm/s TAPSE (M-mode): 1.7 cm LEFT ATRIUM             Index LA diam:        3.70 cm 2.18 cm/m LA Vol (A2C):   56.5 ml 33.30 ml/m LA Vol (A4C):   63.4 ml 37.37 ml/m LA Biplane Vol: 61.0 ml 35.96 ml/m  AORTIC VALVE LVOT Vmax:    83.60 cm/s LVOT Vmean:  58.600 cm/s LVOT VTI:    0.170 m AI PHT:      463 msec  AORTA Ao Root diam: 2.80 cm MITRAL VALVE                TRICUSPID VALVE MV Area (PHT): 4.21 cm     TR Peak grad:   25.4 mmHg MV Decel Time: 180 msec     TR Vmax:        252.00 cm/s MV E velocity: 67.70 cm/s MV A velocity: 117.00 cm/s  SHUNTS MV E/A ratio:  0.58         Systemic VTI:  0.17 m                             Systemic Diam: 1.90 cm Mertie Moores MD Electronically signed by Mertie Moores MD Signature Date/Time: 11/03/2019/3:56:18 PM    Final     Cardiac Studies    Prox RCA lesion is 99% stenosed.  Mid RCA lesion is 100% stenosed.  SVG graft was visualized by angiography.  Prox Graft to Mid Graft lesion is 40% stenosed.  Prox Cx to Mid Cx lesion is 100% stenosed.  Origin to Prox Graft lesion before 2nd Mrg is 70% stenosed.  Mid Graft lesion before 2nd Mrg is 90% stenosed.  Dist Graft to Insertion lesion before 2nd Mrg is 70% stenosed.  Mid LAD lesion is 100% stenosed.  LIMA graft was visualized by angiography.  Prox LAD lesion is 95% stenosed.   1. Severe triple vessel CAD s/p 4V CABG with 4/4 patent bypass grafts.  2. The native LAD has a severe stenosis just before the Diagonal branch and is then occluded in the mid segment. The LIMA is patent to the LAD.  3. The native Circumflex is occluded in the mid segment. The first and second OM branches are supplied by the patent vein graft. The vein graft has severe proximal, mid and distal body stenosis.  4. The native RCA is a small to moderate caliber vessel with severe proximal stenosis before a RV marginal branch and then total chronic mid occlusion. The vein graft to the distal RCA branches is patent. The mid body of the vein graft has a patent stent with mild to moderate restenosis.   Recommendations: She currently has flow down all of her grafts. The vein graft to the OM system has three areas of stenosis but good flow. It is not clear what is  causing her pain. Will plan to admit to the ICU tonight. She was loaded with Brilinta 180 mg po x 1 in the cath lab. Will continue Brilinta 90 mg po BID. She is intolerant of ASA.  I think it will be important to exclude PE before we move forward with PCI. Will plan IV heparin tonight and then a chest CTA tomorrow to excluce PE. The  lesions in her vein graft to the obtuse marginal branches will need to be treated if there is no other cause for her chest pain and elevated troponin. Will also consider PCI of the native LAD which supplies the moderate caliber Diagonal branch. Will start a heparin drip 8 hours post sheath pull. Echo tomorrow. NTG drip. Continue beta blocker. She is statin intolerant.       ECHO 11/03/2019 IMPRESSIONS    1. Left ventricular ejection fraction, by estimation, is 65 to 70%. The  left ventricle has normal function. The left ventricle has no regional  wall motion abnormalities. There is mild asymmetric left ventricular  hypertrophy. Left ventricular diastolic  parameters are consistent with Grade I diastolic dysfunction (impaired  relaxation).  2. Right ventricular systolic function is normal. The right ventricular  size is normal. There is normal pulmonary artery systolic pressure.  3. Left atrial size was mildly dilated.  4. The mitral valve is normal in structure. No evidence of mitral valve  regurgitation. No evidence of mitral stenosis.    5. The aortic valve is normal in structure. Aortic valve regurgitation is  mild. No aortic stenosis is present.   Patient Profile     TOMIEKA SCHAFF is a 74 y.o. female with diabetes, CAD status post CABG, hyperlipidemia, GERD who was admitted on 11/02/2019 for non-STEMI.   Assessment & Plan    1. NSTEMI; Day 3 since presentation.  [place] personally reviewed. Plan for PCI to SVG to Lcx at 2 sites; and possible PCI to proximal LAD prior to diagonal branch which is in jeopardy and not filled by LIMA graft to to  occlusion of mid LAD proximal to LIMA insertion.  Currently on BB, apparently IV NTG was dc'd. With concomitant CAD may benefit oral nitrates or amlodipine.  Patient aware of risks/benefits of procedure.  On ASA/Brilinta for DAPT.  2. Essential HTN: was on home atenolol and clonidine.  Consider changing clonidine to ARB or ACE-I, but will not change with contrast today at PCI.   3 Hyperlipidemia with target < 70: Mixed hyperlipidemia.  On zetia. Candidate for PCSK9 if statin intolerant. Consider Vascepa with TG 392.  4. DM: HbA1c 8.0; needs improved control.   Signed, Troy Sine, MD, [facility] 11/05/2019, 8:06 AM

## 2019-11-05 NOTE — Interval H&P Note (Signed)
Cath Lab Visit (complete for each Cath Lab visit)  Clinical Evaluation Leading to the Procedure:   ACS: Yes.    Non-ACS:    Anginal Classification: CCS IV  Anti-ischemic medical therapy: Minimal Therapy (1 class of medications)  Non-Invasive Test Results: No non-invasive testing performed  Prior CABG: Previous CABG        History and Physical Interval Note:  11/05/2019 11:49 AM  [name]  has presented today for surgery, with the diagnosis of nstemi.  The various methods of treatment have been discussed with the patient and family. After consideration of risks, benefits and other options for treatment, the patient has consented to  Procedure(s): CORONARY STENT INTERVENTION (N/A) as a surgical intervention.  The patient's history has been reviewed, patient examined, no change in status, stable for surgery.  I have reviewed the patient's chart and labs.  Questions were answered to the patient's satisfaction.     Larae Grooms

## 2019-11-05 NOTE — Progress Notes (Signed)
Progress Note  Patient Name: [name] Date of Encounter: 11/05/2019  Primary Cardiologist: Dr. Stanford Breed  Subjective   No recurrent chest pain  Inpatient Medications    Scheduled Meds: . aspirin  324 mg Oral Once  . aspirin  81 mg Oral Daily  . aspirin  81 mg Oral Pre-Cath  . atenolol  75 mg Oral Daily  . Chlorhexidine Gluconate Cloth  6 each Topical Daily  . cloNIDine  0.2 mg Oral QHS  . docusate sodium  100 mg Oral BID  . ezetimibe  10 mg Oral Daily  . furosemide  40 mg Oral Daily  . insulin aspart  0-15 Units Subcutaneous TID WC  . insulin aspart  0-5 Units Subcutaneous QHS  . PARoxetine  40 mg Oral q morning - 10a  . sodium chloride flush  3 mL Intravenous Q12H  . sodium chloride flush  3 mL Intravenous Q12H  . ticagrelor  90 mg Oral BID   Continuous Infusions: . sodium chloride Stopped (11/04/19 1643)  . sodium chloride    . sodium chloride 1 mL/kg/hr (11/05/19 0717)  . heparin 1,100 Units/hr (11/05/19 0700)  . nitroGLYCERIN Stopped (11/03/19 1130)   PRN Meds: sodium chloride, sodium chloride, acetaminophen, bisacodyl, nitroGLYCERIN, ondansetron (ZOFRAN) IV, oxyCODONE, sodium chloride flush, sodium chloride flush   Vital Signs    Vitals:   11/05/19 0600 11/05/19 0700 11/05/19 0740 11/05/19 0800  BP: (!) 128/56 127/65  136/73  Pulse: 72 74  68  Resp: (!) 24 (!) 21  16  Temp:   98.5 F (36.9 C)   TempSrc:   Oral   SpO2: 94% 96%  98%  Weight:      Height:        Intake/Output Summary (Last 24 hours) at 11/05/2019 0806 Last data filed at 11/05/2019 0700 Gross per 24 hour  Intake 728.85 ml  Output 2100 ml  Net -1371.15 ml    I/O since admission: -3873  Filed Weights   11/02/19 1522 11/04/19 0400 11/05/19 0431  Weight: 66.7 kg 64.5 kg 65.1 kg    Telemetry    Sinus rhythm - Personally Reviewed  ECG    ECG (independently read by me): NSR at 77; LBBB  Physical Exam   BP 136/73   Pulse 68   Temp 98.5 F (36.9 C) (Oral)   Resp 16    Ht 5\' 3"  (1.6 m)   Wt 65.1 kg   SpO2 98%   BMI 25.42 kg/m  General: Alert, oriented, no distress.  Skin: normal turgor, no rashes, warm and dry HEENT: Normocephalic, atraumatic. Pupils equal round and reactive to light; sclera anicteric; extraocular muscles intact;  Nose without nasal septal hypertrophy Mouth/Parynx benign; Mallinpatti scale 3 Neck: No JVD, no carotid bruits; normal carotid upstroke Lungs: clear to ausculatation and percussion; no wheezing or rales Chest wall: without tenderness to palpitation Heart: PMI not displaced, RRR, s1 s2 normal, 1/6 systolic murmur, no diastolic murmur, no rubs, gallops, thrills, or heaves Abdomen: soft, nontender; no hepatosplenomehaly, BS+; abdominal aorta nontender and not dilated by palpation. Back: no CVA tenderness Pulses 2+; R groin cath site stable Musculoskeletal: full range of motion, normal strength, no joint deformities Extremities: no clubbing cyanosis or edema, Homan's sign negative  Neurologic: grossly nonfocal; Cranial nerves grossly wnl Psychologic: Normal mood and affect   Labs    Chemistry Recent Labs  Lab 11/03/19 0217 11/04/19 0130 11/05/19 0442  NA 134* 135 134*  K 3.9 3.5 3.8  CL 99 98  98  CO2 23 25 24   GLUCOSE 344* 287* 328*  BUN 13 11 13   CREATININE 0.71 0.56 0.78  CALCIUM 9.5 9.6 9.6  GFRNONAA >60 >60 >60  GFRAA >60 >60 >60  ANIONGAP 12 12 12      Hematology Recent Labs  Lab 11/03/19 0217 11/04/19 0130 11/05/19 0442  WBC 5.9 5.4 6.2  RBC 3.89 4.14 4.03  HGB 12.3 13.0 12.9  HCT 35.1* 37.2 36.2  MCV 90.2 89.9 89.8  MCH 31.6 31.4 32.0  MCHC 35.0 34.9 35.6  RDW 12.4 12.0 12.0  PLT 249 239 241    HS TROP: 1701 > 8299 > 11761 > 12407 > 9324 >7882  BNPNo results for input(s): BNP, PROBNP in the last 168 hours.   DDimer No results for input(s): DDIMER in the last 168 hours.   Lipid Panel     Component Value Date/Time   CHOL 196 11/04/2019 0130   TRIG 392 (H) 11/04/2019 0130   HDL 35 (L)  11/04/2019 0130   CHOLHDL 5.6 11/04/2019 0130   VLDL 78 (H) 11/04/2019 0130   LDLCALC 83 11/04/2019 0130   LDLDIRECT 130.6 06/07/2011 0842     Radiology    CT ANGIO CHEST AORTA W/CM & OR WO/CM  Result Date: 11/03/2019 CLINICAL DATA:  Chest pain, bundle branch block EXAM: CT ANGIOGRAPHY CHEST WITH CONTRAST TECHNIQUE: Multidetector CT imaging of the chest was performed using the standard protocol during bolus administration of intravenous contrast. Multiplanar CT image reconstructions and MIPs were obtained to evaluate the vascular anatomy. CONTRAST:  175mL OMNIPAQUE IOHEXOL 350 MG/ML SOLN COMPARISON:  None. FINDINGS: Cardiovascular: Borderline cardiomegaly. No pericardial effusion. Borderline dilated central pulmonary arteries. Satisfactory opacification of pulmonary arteries noted, and there is no evidence of pulmonary emboli. 3-vessel coronary calcifications. Previous CABG. Calcified atheromatous plaque throughout the descending thoracic aorta which is ectatic up to 3.1 cm diameter proximally. No dissection or stenosis. Classic 3 vessel brachiocephalic arterial origin anatomy without stenosis. Mediastinum/Nodes: No hilar or mediastinal adenopathy. Lungs/Pleura: No pleural effusion. No pneumothorax. Minimal dependent atelectasis/scarring posteriorly in both lungs. Upper Abdomen: Cholecystectomy clips. No acute findings. Musculoskeletal: Sternotomy wires. No fracture or worrisome bone lesion. Review of the MIP images confirms the above findings. IMPRESSION: Negative for acute PE or thoracic aortic dissection. Aortic Atherosclerosis (ICD10-I70.0) with 3.1 cm fusiform dilatation of the proximal descending thoracic segment. Electronically Signed   By: Lucrezia Europe M.D.   On: 11/03/2019 14:18   ECHOCARDIOGRAM COMPLETE  Result Date: 11/03/2019    ECHOCARDIOGRAM REPORT   Patient Name:   [name] Date of Exam: 11/03/2019 Medical Rec #:  [id number]      Height:       63.0 in Accession #:    [id number]      Weight:       147.0 lb Date of Birth:  11-30-1945      BSA:          1.696 m Patient Age:    74 years       BP:           146/70 mmHg Patient Gender: F              HR:           73 bpm. Exam Location:  Inpatient Procedure: 2D Echo Indications:    CAD Native Vessel 414.01 / I25.10  History:        Patient has prior history of Echocardiogram examinations. CAD  and non-STEMI with severe three-vessel disease, Prior CABG,                 Arrythmias:LBBB; Risk Factors:Hypertension, Diabetes and                 Dyslipidemia. GERD.  Sonographer:    Darlina Sicilian RDCS Referring Phys: [place]  1. Left ventricular ejection fraction, by estimation, is 65 to 70%. The left ventricle has normal function. The left ventricle has no regional wall motion abnormalities. There is mild asymmetric left ventricular hypertrophy. Left ventricular diastolic parameters are consistent with Grade I diastolic dysfunction (impaired relaxation).  2. Right ventricular systolic function is normal. The right ventricular size is normal. There is normal pulmonary artery systolic pressure.  3. Left atrial size was mildly dilated.  4. The mitral valve is normal in structure. No evidence of mitral valve regurgitation. No evidence of mitral stenosis.  5. The aortic valve is normal in structure. Aortic valve regurgitation is mild. No aortic stenosis is present. FINDINGS  Left Ventricle: Left ventricular ejection fraction, by estimation, is 65 to 70%. The left ventricle has normal function. The left ventricle has no regional wall motion abnormalities. The left ventricular internal cavity size was normal in size. There is  mild asymmetric left ventricular hypertrophy. Left ventricular diastolic parameters are consistent with Grade I diastolic dysfunction (impaired relaxation). Right Ventricle: The right ventricular size is normal. No increase in right ventricular wall thickness. Right ventricular systolic  function is normal. There is normal pulmonary artery systolic pressure. The tricuspid regurgitant velocity is 2.52 m/s, and  with an assumed right atrial pressure of 3 mmHg, the estimated right ventricular systolic pressure is [id number] mmHg. Left Atrium: Left atrial size was mildly dilated. Right Atrium: Right atrial size was normal in size. Pericardium: There is no evidence of pericardial effusion. Mitral Valve: The mitral valve is normal in structure. No evidence of mitral valve regurgitation. No evidence of mitral valve stenosis. Tricuspid Valve: The tricuspid valve is normal in structure. Tricuspid valve regurgitation is not demonstrated. No evidence of tricuspid stenosis. Aortic Valve: The aortic valve is normal in structure. Aortic valve regurgitation is mild. Aortic regurgitation PHT measures 463 msec. No aortic stenosis is present. Pulmonic Valve: The pulmonic valve was normal in structure. Pulmonic valve regurgitation is not visualized. No evidence of pulmonic stenosis. Aorta: The aortic root and ascending aorta are structurally normal, with no evidence of dilitation. IAS/Shunts: The atrial septum is grossly normal.  LEFT VENTRICLE PLAX 2D LVIDd:         4.00 cm      Diastology LVIDs:         2.40 cm      LV e' lateral:   4.91 cm/s LV PW:         1.00 cm      LV E/e' lateral: 13.8 LV IVS:        1.30 cm      LV e' medial:    3.24 cm/s LVOT diam:     1.90 cm      LV E/e' medial:  20.9 LV SV:         48 LV SV Index:   28 LVOT Area:     2.84 cm  LV Volumes (MOD) LV vol d, MOD A2C: 131.0 ml LV vol d, MOD A4C: 111.0 ml LV vol s, MOD A2C: 52.7 ml LV vol s, MOD A4C: 56.4 ml LV SV MOD A2C:     78.3  ml LV SV MOD A4C:     111.0 ml LV SV MOD BP:      66.2 ml RIGHT VENTRICLE RV S prime:     9.81 cm/s TAPSE (M-mode): 1.7 cm LEFT ATRIUM             Index LA diam:        3.70 cm 2.18 cm/m LA Vol (A2C):   56.5 ml 33.30 ml/m LA Vol (A4C):   63.4 ml 37.37 ml/m LA Biplane Vol: 61.0 ml 35.96 ml/m  AORTIC VALVE LVOT Vmax:    83.60 cm/s LVOT Vmean:  58.600 cm/s LVOT VTI:    0.170 m AI PHT:      463 msec  AORTA Ao Root diam: 2.80 cm MITRAL VALVE                TRICUSPID VALVE MV Area (PHT): 4.21 cm     TR Peak grad:   25.4 mmHg MV Decel Time: 180 msec     TR Vmax:        252.00 cm/s MV E velocity: 67.70 cm/s MV A velocity: 117.00 cm/s  SHUNTS MV E/A ratio:  0.58         Systemic VTI:  0.17 m                             Systemic Diam: 1.90 cm Mertie Moores MD Electronically signed by Mertie Moores MD Signature Date/Time: 11/03/2019/3:56:18 PM    Final     Cardiac Studies    Prox RCA lesion is 99% stenosed.  Mid RCA lesion is 100% stenosed.  SVG graft was visualized by angiography.  Prox Graft to Mid Graft lesion is 40% stenosed.  Prox Cx to Mid Cx lesion is 100% stenosed.  Origin to Prox Graft lesion before 2nd Mrg is 70% stenosed.  Mid Graft lesion before 2nd Mrg is 90% stenosed.  Dist Graft to Insertion lesion before 2nd Mrg is 70% stenosed.  Mid LAD lesion is 100% stenosed.  LIMA graft was visualized by angiography.  Prox LAD lesion is 95% stenosed.   1. Severe triple vessel CAD s/p 4V CABG with 4/4 patent bypass grafts.  2. The native LAD has a severe stenosis just before the Diagonal branch and is then occluded in the mid segment. The LIMA is patent to the LAD.  3. The native Circumflex is occluded in the mid segment. The first and second OM branches are supplied by the patent vein graft. The vein graft has severe proximal, mid and distal body stenosis.  4. The native RCA is a small to moderate caliber vessel with severe proximal stenosis before a RV marginal branch and then total chronic mid occlusion. The vein graft to the distal RCA branches is patent. The mid body of the vein graft has a patent stent with mild to moderate restenosis.   Recommendations: She currently has flow down all of her grafts. The vein graft to the OM system has three areas of stenosis but good flow. It is not clear what is  causing her pain. Will plan to admit to the ICU tonight. She was loaded with Brilinta 180 mg po x 1 in the cath lab. Will continue Brilinta 90 mg po BID. She is intolerant of ASA.  I think it will be important to exclude PE before we move forward with PCI. Will plan IV heparin tonight and then a chest CTA tomorrow to excluce PE. The  lesions in her vein graft to the obtuse marginal branches will need to be treated if there is no other cause for her chest pain and elevated troponin. Will also consider PCI of the native LAD which supplies the moderate caliber Diagonal branch. Will start a heparin drip 8 hours post sheath pull. Echo tomorrow. NTG drip. Continue beta blocker. She is statin intolerant.       ECHO 11/03/2019 IMPRESSIONS    1. Left ventricular ejection fraction, by estimation, is 65 to 70%. The  left ventricle has normal function. The left ventricle has no regional  wall motion abnormalities. There is mild asymmetric left ventricular  hypertrophy. Left ventricular diastolic  parameters are consistent with Grade I diastolic dysfunction (impaired  relaxation).  2. Right ventricular systolic function is normal. The right ventricular  size is normal. There is normal pulmonary artery systolic pressure.  3. Left atrial size was mildly dilated.  4. The mitral valve is normal in structure. No evidence of mitral valve  regurgitation. No evidence of mitral stenosis.    5. The aortic valve is normal in structure. Aortic valve regurgitation is  mild. No aortic stenosis is present.   Patient Profile     [name] is a 74 y.o. female with diabetes, CAD status post CABG, hyperlipidemia, GERD who was admitted on 11/02/2019 for non-STEMI.   Assessment & Plan    1. NSTEMI; Day 3 since presentation.  Cines personally reviewed. Plan for PCI to SVG to Lcx at 2 sites; and possible PCI to proximal LAD prior to diagonal branch which is in jeopardy and not filled by LIMA graft to to  occlusion of mid LAD proximal to LIMA insertion.  Currently on BB, apparently IV NTG was dc'd. With concomitant CAD may benefit oral nitrates or amlodipine.  Patient aware of risks/benefits of procedure.  On ASA/Brilinta for DAPT.  2. Essential HTN: was on home atenolol and clonidine.  Consider changing clonidine to ARB or ACE-I, but will not change with contrast today at PCI.   3 Hyperlipidemia with target < 70: Mixed hyperlipidemia.  On zetia. Candidate for PCSK9 if statin intolerant. Consider Vascepa with TG 392.  4. DM: HbA1c 8.0; needs improved control.   Signed, Troy Sine, MD, [facility] 11/05/2019, 8:06 AM

## 2019-11-05 NOTE — Progress Notes (Signed)
Site area: rt groin fa sheath Site Prior to Removal:  Level 0; small faint bruise Pressure Applied For: 20 minutes Manual:   yes Patient Status During Pull:  stable Post Pull Site:  Level 0 Post Pull Instructions Given:  yes Post Pull Pulses Present: rt dp palpable Dressing Applied:  Gauze and tegaderm Bedrest begins @ 1520 Comments:

## 2019-11-06 ENCOUNTER — Telehealth: Payer: Self-pay | Admitting: Cardiology

## 2019-11-06 LAB — BASIC METABOLIC PANEL
Anion gap: 13 (ref 5–15)
BUN: 10 mg/dL (ref 8–23)
CO2: 21 mmol/L — ABNORMAL LOW (ref 22–32)
Calcium: 9.3 mg/dL (ref 8.9–10.3)
Chloride: 104 mmol/L (ref 98–111)
Creatinine, Ser: 0.65 mg/dL (ref 0.44–1.00)
GFR calc Af Amer: >60 mL/min (ref >60)
GFR calc non Af Amer: >60 mL/min (ref >60)
Glucose, Bld: 271 mg/dL — ABNORMAL HIGH (ref 70–99)
Potassium: 3.5 mmol/L (ref 3.5–5.1)
Sodium: 138 mmol/L (ref 135–145)

## 2019-11-06 LAB — GLUCOSE, CAPILLARY
Glucose-Capillary: 185 mg/dL — ABNORMAL HIGH (ref 70–99)
Glucose-Capillary: 273 mg/dL — ABNORMAL HIGH (ref 70–99)
Glucose-Capillary: 291 mg/dL — ABNORMAL HIGH (ref 70–99)
Glucose-Capillary: 260 mg/dL — ABNORMAL HIGH (ref 70–99)
Glucose-Capillary: 295 mg/dL — ABNORMAL HIGH (ref 70–99)

## 2019-11-06 LAB — CBC
HCT: 35.9 % — ABNORMAL LOW (ref 36.0–46.0)
Hemoglobin: 12.7 g/dL (ref 12.0–15.0)
MCH: 32.5 pg (ref 26.0–34.0)
MCHC: 35.4 g/dL (ref 30.0–36.0)
MCV: 91.8 fL (ref 80.0–100.0)
Platelets: 249 10*3/uL (ref 150–400)
RBC: 3.91 MIL/uL (ref 3.87–5.11)
RDW: 12.4 % (ref 11.5–15.5)
WBC: 5.7 10*3/uL (ref 4.0–10.5)
nRBC: 0 % (ref 0.0–0.2)

## 2019-11-06 MED ORDER — LIVING WELL WITH DIABETES BOOK
Freq: Once | Status: DC
  Filled 2019-11-06: qty 1

## 2019-11-06 MED ORDER — ISOSORBIDE MONONITRATE ER 30 MG PO TB24
30.0000 mg | ORAL_TABLET | Freq: Every day | ORAL | Status: DC
  Administered 2019-11-06 – 2019-11-07 (×2): 30 mg via ORAL
  Filled 2019-11-06 (×2): qty 1

## 2019-11-06 NOTE — Progress Notes (Addendum)
Progress Note  Patient Name: [name] Date of Encounter: 11/06/2019  Primary Cardiologist: Kirk Ruths, MD  Subjective   Some chest discomfort with ambulation.   Inpatient Medications    Scheduled Meds: . amLODipine  10 mg Oral Q lunch  . aspirin  324 mg Oral Once  . aspirin  81 mg Oral Daily  . atenolol  75 mg Oral Daily  . Chlorhexidine Gluconate Cloth  6 each Topical Daily  . cloNIDine  0.2 mg Oral QHS  . docusate sodium  100 mg Oral BID  . ezetimibe  10 mg Oral Daily  . furosemide  40 mg Oral Daily  . insulin aspart  0-15 Units Subcutaneous TID WC  . insulin aspart  0-5 Units Subcutaneous QHS  . linagliptin  5 mg Oral Q lunch  . lisinopril  10 mg Oral Daily  . pantoprazole  40 mg Oral Daily  . PARoxetine  40 mg Oral q morning - 10a  . sodium chloride flush  3 mL Intravenous Q12H  . sodium chloride flush  3 mL Intravenous Q12H  . ticagrelor  90 mg Oral BID   Continuous Infusions: . sodium chloride 64.5 mL/hr at 11/05/19 1000  . sodium chloride    . nitroGLYCERIN Stopped (11/03/19 1130)   PRN Meds: sodium chloride, sodium chloride, acetaminophen, acetaminophen, bisacodyl, nitroGLYCERIN, ondansetron (ZOFRAN) IV, ondansetron (ZOFRAN) IV, oxyCODONE, sodium chloride flush, sodium chloride flush   Vital Signs    Vitals:   11/06/19 0433 11/06/19 0903 11/06/19 0920 11/06/19 0930  BP: 140/90 (!) 119/56 129/74 (!) 151/85  Pulse: 71 80  77  Resp: 14  17 (!) 24  Temp: 98.2 F (36.8 C) 99 F (37.2 C)    TempSrc: Oral Oral    SpO2: 97% 97%  99%  Weight: 64.5 kg     Height:        Intake/Output Summary (Last 24 hours) at 11/06/2019 1117 Last data filed at 11/06/2019 1019 Gross per 24 hour  Intake 112.58 ml  Output 2075 ml  Net -1962.42 ml   Last 3 Weights 11/06/2019 11/05/2019 11/04/2019  Weight (lbs) 142 lb 1.6 oz 143 lb 8.3 oz 142 lb 3.2 oz  Weight (kg) 64.456 kg 65.1 kg 64.5 kg      Telemetry    Sinus rhythm- Personally Reviewed  ECG    Sinus  rhythm with anterior T wave inversion personally Reviewed  Physical Exam   GEN: No acute distress.   Neck: No JVD Cardiac: RRR, no murmurs, rubs, or gallops.  Right groin without hematoma. Respiratory: Clear to auscultation bilaterally. GI: Soft, nontender, non-distended  MS: No edema; No deformity. Neuro:  Nonfocal  Psych: Flat affect  Labs    High Sensitivity Troponin:   Recent Labs  Lab 11/02/19 1928 11/02/19 2258 11/03/19 0217 11/03/19 0733 11/03/19 0936  TROPONINIHS 8,299* 11,762* 12,407* 9,324* 7,882*      Chemistry Recent Labs  Lab 11/04/19 0130 11/05/19 0442 11/06/19 0408  NA 135 134* 138  K 3.5 3.8 3.5  CL 98 98 104  CO2 25 24 21*  GLUCOSE 287* 328* 271*  BUN 11 13 10   CREATININE 0.56 0.78 0.65  CALCIUM 9.6 9.6 9.3  GFRNONAA >60 >60 >60  GFRAA >60 >60 >60  ANIONGAP 12 12 13      Hematology Recent Labs  Lab 11/04/19 0130 11/05/19 0442 11/06/19 0408  WBC 5.4 6.2 5.7  RBC 4.14 4.03 3.91  HGB 13.0 12.9 12.7  HCT 37.2 36.2 35.9*  MCV  89.9 89.8 91.8  MCH 31.4 32.0 32.5  MCHC 34.9 35.6 35.4  RDW 12.0 12.0 12.4  PLT 239 241 249    Radiology    CARDIAC CATHETERIZATION  Addendum Date: 11/05/2019    Only imaged SVG to OM.  Origin to Prox Graft lesion before 2nd Mrg is 70% stenosed. A drug-eluting stent was successfully placed using a SYNERGY XD 3.50X28, post dilated to 3.8 and optimized by IVUS.  Post intervention, there is a 0% residual stenosis.  Mid Graft lesion before 2nd Mrg is 90% stenosed. A drug-eluting stent was successfully placed using a SYNERGY XD 3.50X12, optimized with IVUS.  Post intervention, there is a 0% residual stenosis.  Dist Graft to Insertion lesion before 2nd Mrg is 50% stenosed.  Continue dual antiplatelet therapy and plan for discharge tomorrow.   Result Date: 11/05/2019  Only imaged SVG to OM.  Origin to Prox Graft lesion before 2nd Mrg is 70% stenosed. A drug-eluting stent was successfully placed using a SYNERGY XD  3.50X28, post dilated to 3.8 and optimized by IVUS.  Post intervention, there is a 0% residual stenosis.  Mid Graft lesion before 2nd Mrg is 90% stenosed. A drug-eluting stent was successfully placed using a SYNERGY XD 3.50X12, optimized with IVUS.  Post intervention, there is a 0% residual stenosis.  Dist Graft to Insertion lesion before 2nd Mrg is 50% stenosed.  Continue dual antiplatelet therapy and plan for discharge tomorrow.    Cardiac Studies     LEFT HEART CATH AND CORS/GRAFTS ANGIOGRAPHY 11/02/19  Conclusion    Prox RCA lesion is 99% stenosed.  Mid RCA lesion is 100% stenosed.  SVG graft was visualized by angiography.  Prox Graft to Mid Graft lesion is 40% stenosed.  Prox Cx to Mid Cx lesion is 100% stenosed.  Origin to Prox Graft lesion before 2nd Mrg is 70% stenosed.  Mid Graft lesion before 2nd Mrg is 90% stenosed.  Dist Graft to Insertion lesion before 2nd Mrg is 70% stenosed.  Mid LAD lesion is 100% stenosed.  LIMA graft was visualized by angiography.  Prox LAD lesion is 95% stenosed.   1. Severe triple vessel CAD s/p 4V CABG with 4/4 patent bypass grafts.  2. The native LAD has a severe stenosis just before the Diagonal branch and is then occluded in the mid segment. The LIMA is patent to the LAD.  3. The native Circumflex is occluded in the mid segment. The first and second OM branches are supplied by the patent vein graft. The vein graft has severe proximal, mid and distal body stenosis.  4. The native RCA is a small to moderate caliber vessel with severe proximal stenosis before a RV marginal branch and then total chronic mid occlusion. The vein graft to the distal RCA branches is patent. The mid body of the vein graft has a patent stent with mild to moderate restenosis.   Recommendations: She currently has flow down all of her grafts. The vein graft to the OM system has three areas of stenosis but good flow. It is not clear what is causing her pain. Will  plan to admit to the ICU tonight. She was loaded with Brilinta 180 mg po x 1 in the cath lab. Will continue Brilinta 90 mg po BID. She is intolerant of ASA.  I think it will be important to exclude PE before we move forward with PCI. Will plan IV heparin tonight and then a chest CTA tomorrow to excluce PE. The lesions in her vein  graft to the obtuse marginal branches will need to be treated if there is no other cause for her chest pain and elevated troponin. Will also consider PCI of the native LAD which supplies the moderate caliber Diagonal branch. Will start a heparin drip 8 hours post sheath pull. Echo tomorrow. NTG drip. Continue beta blocker. She is statin intolerant.     Echo 11/03/19 IMPRESSIONS    1. Left ventricular ejection fraction, by estimation, is 65 to 70%. The  left ventricle has normal function. The left ventricle has no regional  wall motion abnormalities. There is mild asymmetric left ventricular  hypertrophy. Left ventricular diastolic  parameters are consistent with Grade I diastolic dysfunction (impaired  relaxation).  2. Right ventricular systolic function is normal. The right ventricular  size is normal. There is normal pulmonary artery systolic pressure.  3. Left atrial size was mildly dilated.  4. The mitral valve is normal in structure. No evidence of mitral valve  regurgitation. No evidence of mitral stenosis.  5. The aortic valve is normal in structure. Aortic valve regurgitation is  mild. No aortic stenosis is present.     CORONARY STENT INTERVENTION  11/05/19  Intravascular Ultrasound/IVUS  Conclusion    Only imaged SVG to OM.  Origin to Prox Graft lesion before 2nd Mrg is 70% stenosed. A drug-eluting stent was successfully placed using a SYNERGY XD 3.50X28, post dilated to 3.8 and optimized by IVUS.  Post intervention, there is a 0% residual stenosis.  Mid Graft lesion before 2nd Mrg is 90% stenosed. A drug-eluting stent was successfully  placed using a SYNERGY XD 3.50X12, optimized with IVUS.  Post intervention, there is a 0% residual stenosis.  Dist Graft to Insertion lesion before 2nd Mrg is 50% stenosed.   Continue dual antiplatelet therapy and plan for discharge tomorrow.     Diagnostic Dominance: Right  Intervention        Patient Profile     [name] a 74 y.o.femalewith diabetes, CAD status post CABG, hyperlipidemia, GERD who was admitted on 11/02/2019 for non-STEMI.  Assessment & Plan     1. NSTEMI -HS-troponin peaked at 12,407. Treated with heparin.  -Emergent cath showed severe three-vessel disease.  LIMA to LAD is patent, vein graft to distal RCA is patent, vein graft to OM 1/2 has a severe proximal stenosis as well as moderate mid and distal stenoses.  Good flow was noted during the procedure. She also has a 95% proximal LAD stenosis that supplies a large diagonal branch.  -Echo with preserved LV function -CT of chest negative for PE. -Patient underwent successful ultrasound-guided DES x2 to SVG to 2nd Mrg.  -Did not felt well with ambulation this morning but denies chest pain or shortness of breath -Aspirin, Brilinta, statin and beta-blocker   2. HTN - BP elevated.  - Consider meds adjustments   3.  Hyperlipidemia 11/04/2019: Cholesterol 196; HDL 35; LDL Cholesterol 83; Triglycerides 392; VLDL 78  -Continue high intensity statin  4. DM - SSI here - Hgb A1c 8   For questions or updates, please contact [place] Please consult [email or web address] for contact info under      SignedLeanor Kail, PA  11/06/2019, 11:17 AM     Patient seen and examined. Agree with assessment and plan. Patient is status post successful stenting at 2 locations in the vein graft supplying her circumflex marginal vessel yesterday. She continues to have high-grade proximal LAD stenosis with potential catheterization to the diagonal vessel. She was very concerned  today when she was told that  she had suffered a heart attack. Her echo Doppler study shows preservation of LV function with hyperdynamic LV contractility. I discussed with her that her heart attack was by virtue of her positive cardiac enzymes but fortunately she did not suffer any myocardial damage. She does not feel comfortable to go home today. We will add isosorbide to her medical regimen with concomitant CAD. Continue beta-blocker therapy, DAPT and ACE-I. Will keep today. Ambulate. Anticipate discharge tomorrow.   Troy Sine, MD, [facility] 11/06/2019 11:49 AM

## 2019-11-06 NOTE — Progress Notes (Signed)
Inpatient Diabetes Program Recommendations  AACE/ADA: New Consensus Statement on Inpatient Glycemic Control   Target Ranges:  Prepandial:   less than 140 mg/dL      Peak postprandial:   less than 180 mg/dL (1-2 hours)      Critically ill patients:  140 - 180 mg/dL   Results for TZIPPY, KETNER (MRN [id number]) as of 11/06/2019 09:47  Ref. Range 11/05/2019 06:42 11/05/2019 07:52 11/05/2019 11:26 11/05/2019 14:23 11/05/2019 17:40 11/05/2019 21:58 11/06/2019 07:44  Glucose-Capillary Latest Ref Range: 70 - 99 mg/dL 328 (H) 298 (H) 218 (H) 185 (H) 283 (H) 271 (H) 273 (H)  Results for SHUNDRA, BRENT (MRN [id number]) as of 11/06/2019 09:47  Ref. Range 11/03/2019 07:33  Hemoglobin A1C Latest Ref Range: 4.8 - 5.6 % 8.1 (H)   Review of Glycemic Control  Diabetes history: DM2 Outpatient Diabetes medications: Glipizide 20 mg daily, Tradjenta 5 mg daily Current orders for Inpatient glycemic control: Tradjenta 5 mg daily, Novolog 0-15 units TID with meals, Novolog 0-5 units QHS  Inpatient Diabetes Program Recommendations:   Oral Agents: Noted Tradjenta 5 mg daily ordered today. If glucose remains elevated, may want to order Glipizide 10 mg daily (half of outpatient dose).  Diet: Added Carb Modified to Heart Healthy diet.  Thanks, Barnie Alderman, RN, MSN, CDE Diabetes Coordinator Inpatient Diabetes Program [phone] (Team Pager from 8am to 5pm)

## 2019-11-06 NOTE — Progress Notes (Signed)
Morning EKG read "STEMI" - EKG compared to previous EKG, prior EKG with more pronounced ST elevation on lead III and AVF - [name], cards PA on call text page and made aware. EKG reviewed by Angie - no acute change. Patient pain free, no complaints, VSS. Will continue to monitor.

## 2019-11-06 NOTE — Progress Notes (Signed)
Patient verbalizing feelings regarding staying in hospital. Stated she dis not feel well after ambulating with Cardiac Rehab. Plan to ambulate patient today to increase strength and support patient to feel confident about doing well at home. Patient stated she did not know she had a heart attack and given time to verbal thoughts about that. Voiced feeling better after discussion and eager to move forward with plan.  Reviewed new medication Imdur with patient with verbalized understanding.

## 2019-11-06 NOTE — Telephone Encounter (Signed)
Patient has TOC appointment with Coletta Memos 11/19/2019 at 11:15am per Vin.

## 2019-11-06 NOTE — Progress Notes (Signed)
CARDIAC REHAB PHASE I   PRE:  Rate/Rhythm: 78 SR  BP:  Supine: 129/74  Sitting:   Standing:    SaO2: 98%RA  MODE:  Ambulation: 470 ft   POST:  Rate/Rhythm: 88 SR  BP:  Supine:   Sitting: 151/85  Standing:    SaO2: 99%RA 0900-1005 Pt walked 470 ft on RA with hand held asst. No c/o CP but did say she felt a little shakey. Pt to sitting on side of bed and MI education completed. Reviewed MI restrictions, NTG use, low carb and heart healthy food choices, walking for ex, and CRP 2. Referred to [facility] program. Discussed the importance of brilinta with stent several times.  Pt seemed to comprehend information as she asked several questions, but materials given so she can review.   Graylon Good, RN BSN  11/06/2019 10:02 AM

## 2019-11-06 NOTE — TOC Benefit Eligibility Note (Signed)
Transition of Care [facility]) Benefit Eligibility Note    Patient Details  Name: [name] MRN: [id number] Date of Birth: 12-Aug-1945   Medication/Dose: Pablo Ledger 90 mg BID  Covered?: Yes  Tier: 3 Drug  Prescription Coverage Preferred Pharmacy: [place] with Person/Company/Phone Number:: Verdis Frederickson /[facility]/ [phone]  Co-Pay: 47.00 for a 30 day supply retail 131.00 for a 90 day supply  Prior Approval: No  Deductible: Met       Orbie Pyo Phone Number: 11/06/2019, 10:56 AM

## 2019-11-06 NOTE — Care Management Important Message (Signed)
Important Message  Patient Details  Name: [name] MRN: [id number] Date of Birth: 03-Nov-1945   Medicare Important Message Given:  Yes     Shelda Altes 11/06/2019, 10:20 AM

## 2019-11-06 NOTE — Care Management (Signed)
1014 11-06-19 Benefits check submitted for ticagrelor (BRILINTA) tablet 90 mg : Dose 90 mg : Oral : 2 times daily. Case Manager will follow for cost. Graves-Bigelow, Ocie Cornfield, RN,BSN Case Manager

## 2019-11-06 NOTE — Telephone Encounter (Signed)
Pt currently admitted. TOC call for 3/31.

## 2019-11-07 LAB — BASIC METABOLIC PANEL
Anion gap: 9 (ref 5–15)
BUN: 15 mg/dL (ref 8–23)
CO2: 22 mmol/L (ref 22–32)
Calcium: 9 mg/dL (ref 8.9–10.3)
Chloride: 100 mmol/L (ref 98–111)
Creatinine, Ser: 0.68 mg/dL (ref 0.44–1.00)
GFR calc Af Amer: >60 mL/min (ref >60)
GFR calc non Af Amer: >60 mL/min (ref >60)
Glucose, Bld: 317 mg/dL — ABNORMAL HIGH (ref 70–99)
Potassium: 3.7 mmol/L (ref 3.5–5.1)
Sodium: 131 mmol/L — ABNORMAL LOW (ref 135–145)

## 2019-11-07 LAB — HEPATIC FUNCTION PANEL
ALT: 42 U/L (ref 0–44)
AST: 30 U/L (ref 15–41)
Albumin: 3.5 g/dL (ref 3.5–5.0)
Alkaline Phosphatase: 75 U/L (ref 38–126)
Bilirubin, Direct: 0.2 mg/dL (ref 0.0–0.2)
Indirect Bilirubin: 0.9 mg/dL (ref 0.3–0.9)
Total Bilirubin: 1.1 mg/dL (ref 0.3–1.2)
Total Protein: 6.6 g/dL (ref 6.5–8.1)

## 2019-11-07 LAB — GLUCOSE, CAPILLARY: Glucose-Capillary: 295 mg/dL — ABNORMAL HIGH (ref 70–99)

## 2019-11-07 MED ORDER — TICAGRELOR 90 MG PO TABS: 90.0000 mg | ORAL_TABLET | Freq: Two times a day (BID) | ORAL | 2 refills | Status: DC

## 2019-11-07 MED ORDER — ASPIRIN 81 MG PO CHEW: 81.0000 mg | CHEWABLE_TABLET | Freq: Every day | ORAL | 1 refills | Status: AC

## 2019-11-07 MED ORDER — ISOSORBIDE MONONITRATE ER 30 MG PO TB24: 30.0000 mg | ORAL_TABLET | Freq: Every day | ORAL | 1 refills | Status: AC

## 2019-11-07 MED ORDER — EZETIMIBE 10 MG PO TABS: 10.0000 mg | ORAL_TABLET | Freq: Every day | ORAL | 1 refills | Status: AC

## 2019-11-07 MED FILL — ASPIRIN LOW DOSE 81 MG CHEW: 81 | 90 days supply | Qty: 90 | Fill #0

## 2019-11-07 MED FILL — EZETIMIBE 10 MG TABS: 10 | 90 days supply | Qty: 90 | Fill #0

## 2019-11-07 MED FILL — BRILINTA 90 MG TABLET: 90 | 30 days supply | Qty: 60 | Fill #0

## 2019-11-07 MED FILL — ISOSORBIDE MN ER 30 MG TAB: 30 | 90 days supply | Qty: 90 | Fill #0

## 2019-11-07 NOTE — Telephone Encounter (Signed)
Attempted to contact patient, number just kept ringing, not able to leave a message. Will try again.

## 2019-11-07 NOTE — Discharge Summary (Signed)
Discharge Summary    Patient ID: [name],  MRN: [id number], DOB/AGE: 1946-08-09 74 y.o.  Admit date: 11/02/2019 Discharge date: 11/07/2019  Primary Care Provider: Curlene Labrum Primary Cardiologist: Kirk Ruths, MD  Discharge Diagnoses    Principal Problem:   Non-ST elevation (NSTEMI) myocardial infarction [facility]) Active Problems:   Hyperlipidemia   Essential hypertension   DIABETES MELLITUS, BORDERLINE   Uncontrolled type 2 diabetes mellitus with hyperglycemia (HCC)   NSTEMI (non-ST elevated myocardial infarction) (HCC)   Allergies Allergies  Allergen Reactions  . Aspirin Nausea Only    Diagnostic Studies/Procedures    Cath: 11/02/19   Prox RCA lesion is 99% stenosed.  Mid RCA lesion is 100% stenosed.  SVG graft was visualized by angiography.  Prox Graft to Mid Graft lesion is 40% stenosed.  Prox Cx to Mid Cx lesion is 100% stenosed.  Origin to Prox Graft lesion before 2nd Mrg is 70% stenosed.  Mid Graft lesion before 2nd Mrg is 90% stenosed.  Dist Graft to Insertion lesion before 2nd Mrg is 70% stenosed.  Mid LAD lesion is 100% stenosed.  LIMA graft was visualized by angiography.  Prox LAD lesion is 95% stenosed.   1. Severe triple vessel CAD s/p 4V CABG with 4/4 patent bypass grafts.  2. The native LAD has a severe stenosis just before the Diagonal branch and is then occluded in the mid segment. The LIMA is patent to the LAD.  3. The native Circumflex is occluded in the mid segment. The first and second OM branches are supplied by the patent vein graft. The vein graft has severe proximal, mid and distal body stenosis.  4. The native RCA is a small to moderate caliber vessel with severe proximal stenosis before a RV marginal branch and then total chronic mid occlusion. The vein graft to the distal RCA branches is patent. The mid body of the vein graft has a patent stent with mild to moderate restenosis.   Recommendations: She currently has  flow down all of her grafts. The vein graft to the OM system has three areas of stenosis but good flow. It is not clear what is causing her pain. Will plan to admit to the ICU tonight. She was loaded with Brilinta 180 mg po x 1 in the cath lab. Will continue Brilinta 90 mg po BID. She is intolerant of ASA.  I think it will be important to exclude PE before we move forward with PCI. Will plan IV heparin tonight and then a chest CTA tomorrow to excluce PE. The lesions in her vein graft to the obtuse marginal branches will need to be treated if there is no other cause for her chest pain and elevated troponin. Will also consider PCI of the native LAD which supplies the moderate caliber Diagonal branch. Will start a heparin drip 8 hours post sheath pull. Echo tomorrow. NTG drip. Continue beta blocker. She is statin intolerant.   Diagnostic Dominance: Right   Echo: 11/03/19  IMPRESSIONS    1. Left ventricular ejection fraction, by estimation, is 65 to 70%. The  left ventricle has normal function. The left ventricle has no regional  wall motion abnormalities. There is mild asymmetric left ventricular  hypertrophy. Left ventricular diastolic  parameters are consistent with Grade I diastolic dysfunction (impaired  relaxation).  2. Right ventricular systolic function is normal. The right ventricular  size is normal. There is normal pulmonary artery systolic pressure.  3. Left atrial size was mildly dilated.  4. The mitral valve is normal in structure. No evidence of mitral valve  regurgitation. No evidence of mitral stenosis.  5. The aortic valve is normal in structure. Aortic valve regurgitation is  mild. No aortic stenosis is present.   Cath: 11/05/19   Only imaged SVG to OM.  Origin to Prox Graft lesion before 2nd Mrg is 70% stenosed. A drug-eluting stent was successfully placed using a SYNERGY XD 3.50X28, post dilated to 3.8 and optimized by IVUS.  Post intervention, there is a 0%  residual stenosis.  Mid Graft lesion before 2nd Mrg is 90% stenosed. A drug-eluting stent was successfully placed using a SYNERGY XD 3.50X12, optimized with IVUS.  Post intervention, there is a 0% residual stenosis.  Dist Graft to Insertion lesion before 2nd Mrg is 50% stenosed.   Continue dual antiplatelet therapy and plan for discharge tomorrow.   Diagnostic Dominance: Right  Intervention     _____________   History of Present Illness     Ms. [name] is a 74 yo female with history of CAD s/p CABG in 2005, PCI, DM, HLD, HTN, GERD presenting to the ED with c/o chest pain. She underwent 4 V CABG in 2005. Last cath in 2013 with chronic occlusion of all three native vessels with 4 patent bypass grafts (LIMA to LAD, SVG to PDA, SVG to OM and OM2). The vein graft to the PDA is previously stented. LVEF=55-65% by LV gram in 2013. She was last seen in our office by Dr. Stanford Breed in 2017.   She began having chest pain at 10 the day of admission. She took her heart burn medications with no relief. She presented to the ED. She was having continued chest pain with radiation through to her back. Her EKG was reviewed and showed sinus rhythm with new LBBB. Troponin 1701.  Hospital Course      1. NSTEMI -HS-troponin peaked at 12,407. Treated with heparin.  -Emergent cath showed severe three-vessel disease. LIMA to LAD is patent, vein graft to distal RCA is patent, vein graft to OM 1/2 has a severe proximal stenosis as well as moderate mid and distal stenoses. Good flow was noted during the procedure. She also has a 95% proximal LAD stenosis that supplies a large diagonal branch.  -Echo with preserved LV function -CT of chest negative for PE. -Patient underwent successful ultrasound-guided DES x2 to SVG to 2nd Mrg.  -Worked well with cardiac rehab without recurrent chest pain. -Treated with DAPT (Aspirin, Brilinta) statin and beta-blocker   2. HTN - BP stable with regimen of BB, ACEi,  Norvasc, Clonidine   3.  Hyperlipidemia 11/04/2019: Cholesterol 196; HDL 35; LDL Cholesterol 83; Triglycerides 392; VLDL 78 - on gemfibrozil with Zetia added this admission  - statin intolerant, will refer to Lipid clinic at discharge  4. DM - resumed on home glipizide and tradjenta - Hgb A1c 8  General: Well developed, well nourished, female appearing in no acute distress. Head: Normocephalic, atraumatic.  Neck: Supple without bruits, JVD. Lungs:  Resp regular and unlabored, CTA. Heart: RRR, S1, S2, no S3, S4, or murmur; no rub. Abdomen: Soft, non-tender, non-distended with normoactive bowel sounds. No hepatomegaly. No rebound/guarding. No obvious abdominal masses. Extremities: No clubbing, cyanosis, edema. Distal pedal pulses are 2+ bilaterally. Right femoral cath site stable without bruising or hematoma Neuro: Alert and oriented X 3. Moves all extremities spontaneously. Psych: Normal affect.  [name] was seen by Dr. Claiborne Billings and determined stable for discharge home. Follow up in the  office has been arranged. Medications are listed below.   _____________  Discharge Vitals Blood pressure 124/64, pulse 70, temperature 99 F (37.2 C), temperature source Oral, resp. rate 18, height 5\' 3"  (1.6 m), weight 64.8 kg, SpO2 98 %.  Filed Weights   11/05/19 0431 11/06/19 0433 11/07/19 0612  Weight: 65.1 kg 64.5 kg 64.8 kg    Labs & Radiologic Studies    CBC Recent Labs    11/05/19 0442 11/06/19 0408  WBC 6.2 5.7  HGB 12.9 12.7  HCT 36.2 35.9*  MCV 89.8 91.8  PLT 241 0000000   Basic Metabolic Panel Recent Labs    11/06/19 0408 11/07/19 0329  NA 138 131*  K 3.5 3.7  CL 104 100  CO2 21* 22  GLUCOSE 271* 317*  BUN 10 15  CREATININE 0.65 0.68  CALCIUM 9.3 9.0   Liver Function Tests Recent Labs    11/07/19 0329  AST 30  ALT 42  ALKPHOS 75  BILITOT 1.1  PROT 6.6  ALBUMIN 3.5   No results for input(s): LIPASE, AMYLASE in the last 72 hours. Cardiac Enzymes No  results for input(s): CKTOTAL, CKMB, CKMBINDEX, TROPONINI in the last 72 hours. BNP Invalid input(s): POCBNP D-Dimer No results for input(s): DDIMER in the last 72 hours. Hemoglobin A1C No results for input(s): HGBA1C in the last 72 hours. Fasting Lipid Panel No results for input(s): CHOL, HDL, LDLCALC, TRIG, CHOLHDL, LDLDIRECT in the last 72 hours. Thyroid Function Tests No results for input(s): TSH, T4TOTAL, T3FREE, THYROIDAB in the last 72 hours.  Invalid input(s): FREET3 _____________  DG Chest 2 View  Result Date: 11/02/2019 CLINICAL DATA:  Chest pain since this morning, history of diabetes mellitus, hypertension, coronary artery disease post MI, CABG and coronary stenting EXAM: CHEST - 2 VIEW COMPARISON:  06/18/2016 FINDINGS: Upper normal heart size post CABG. Mediastinal contours and pulmonary vascularity normal. Mild atherosclerotic calcification aorta. Coronary stents identified along RIGHT heart border. Minimal bibasilar atelectasis. Lungs otherwise clear. No pleural effusion or pneumothorax. Bones demineralized. IMPRESSION: Post CABG and coronary stenting. Minimal bibasilar atelectasis. Electronically Signed   By: Lavonia Dana M.D.   On: 11/02/2019 16:01   CARDIAC CATHETERIZATION  Addendum Date: 11/05/2019    Only imaged SVG to OM.  Origin to Prox Graft lesion before 2nd Mrg is 70% stenosed. A drug-eluting stent was successfully placed using a SYNERGY XD 3.50X28, post dilated to 3.8 and optimized by IVUS.  Post intervention, there is a 0% residual stenosis.  Mid Graft lesion before 2nd Mrg is 90% stenosed. A drug-eluting stent was successfully placed using a SYNERGY XD 3.50X12, optimized with IVUS.  Post intervention, there is a 0% residual stenosis.  Dist Graft to Insertion lesion before 2nd Mrg is 50% stenosed.  Continue dual antiplatelet therapy and plan for discharge tomorrow.   Result Date: 11/05/2019  Only imaged SVG to OM.  Origin to Prox Graft lesion before 2nd Mrg is  70% stenosed. A drug-eluting stent was successfully placed using a SYNERGY XD 3.50X28, post dilated to 3.8 and optimized by IVUS.  Post intervention, there is a 0% residual stenosis.  Mid Graft lesion before 2nd Mrg is 90% stenosed. A drug-eluting stent was successfully placed using a SYNERGY XD 3.50X12, optimized with IVUS.  Post intervention, there is a 0% residual stenosis.  Dist Graft to Insertion lesion before 2nd Mrg is 50% stenosed.  Continue dual antiplatelet therapy and plan for discharge tomorrow.   CARDIAC CATHETERIZATION  Addendum Date: 11/02/2019    Prox  RCA lesion is 99% stenosed.  Mid RCA lesion is 100% stenosed.  SVG graft was visualized by angiography.  Prox Graft to Mid Graft lesion is 40% stenosed.  Prox Cx to Mid Cx lesion is 100% stenosed.  Origin to Prox Graft lesion before 2nd Mrg is 70% stenosed.  Mid Graft lesion before 2nd Mrg is 90% stenosed.  Dist Graft to Insertion lesion before 2nd Mrg is 70% stenosed.  Mid LAD lesion is 100% stenosed.  LIMA graft was visualized by angiography.  Prox LAD lesion is 95% stenosed.  1. Severe triple vessel CAD s/p 4V CABG with 4/4 patent bypass grafts. 2. The native LAD has a severe stenosis just before the Diagonal branch and is then occluded in the mid segment. The LIMA is patent to the LAD. 3. The native Circumflex is occluded in the mid segment. The first and second OM branches are supplied by the patent vein graft. The vein graft has severe proximal, mid and distal body stenosis. 4. The native RCA is a small to moderate caliber vessel with severe proximal stenosis before a RV marginal branch and then total chronic mid occlusion. The vein graft to the distal RCA branches is patent. The mid body of the vein graft has a patent stent with mild to moderate restenosis. Recommendations: She currently has flow down all of her grafts. The vein graft to the OM system has three areas of stenosis but good flow. It is not clear what is causing  her pain. Will plan to admit to the ICU tonight. She was loaded with Brilinta 180 mg po x 1 in the cath lab. Will continue Brilinta 90 mg po BID. She is intolerant of ASA. I think it will be important to exclude PE before we move forward with PCI. Will plan IV heparin tonight and then a chest CTA tomorrow to excluce PE. The lesions in her vein graft to the obtuse marginal branches will need to be treated if there is no other cause for her chest pain and elevated troponin. Will also consider PCI of the native LAD which supplies the moderate caliber Diagonal branch. Will start a heparin drip 8 hours post sheath pull. Echo tomorrow. NTG drip. Continue beta blocker. She is statin intolerant.   Result Date: 11/02/2019  Prox RCA lesion is 99% stenosed.  Mid RCA lesion is 100% stenosed.  SVG graft was visualized by angiography.  Prox Graft to Mid Graft lesion is 40% stenosed.  Prox Cx to Mid Cx lesion is 100% stenosed.  Origin to Prox Graft lesion before 2nd Mrg is 70% stenosed.  Mid Graft lesion before 2nd Mrg is 90% stenosed.  Dist Graft to Insertion lesion before 2nd Mrg is 70% stenosed.  Mid LAD lesion is 100% stenosed.  LIMA graft was visualized by angiography.  Prox LAD lesion is 95% stenosed.  1. Severe triple vessel CAD s/p 4V CABG with 4/4 patent bypass grafts. 2. The native LAD has a severe stenosis just before the Diagonal branch and is then occluded in the mid segment. The LIMA is patent to the LAD. 3. The native Circumflex is occluded in the mid segment. The first and second OM branches are supplied by the patent vein graft. The vein graft has severe proximal, mid and distal body stenosis. 4. The native RCA is a small to moderate caliber vessel with severe proximal stenosis before a RV marginal branch and then total chronic mid occlusion. The vein graft to the distal RCA branches is patent. The mid  body of the vein graft has a patent stent with mild to moderate restenosis. Recommendations: She  currently has flow down all of her grafts. The vein graft to the OM system has three areas of stenosis. Will plan to admit to the ICU tonight. She was loaded with Brilinta 180 mg po x 1 in the cath lab. Will continue Brilinta 90 mg po BID. She is intolerant of ASA. The lesions in her vein graft to the obtuse marginal branches will need to be treated. Will also consider PCI of the native LAD which supplies the moderate caliber Diagonal branch. Will start a heparin drip 8 hours post sheath pull. Echo tomorrow. NTG drip. Plan PCI of the SVG to OM branches and possibly the proximal LAD on Monday. Continue beta blocker. She is statin intolerant. During the case, we had a STEMI activation for another patient. Since there is no acutely occluded vessel, her PCI will be delayed. She will be loaded with Brilinta today with PCI on Monday.   CT ANGIO CHEST AORTA W/CM & OR WO/CM  Result Date: 11/03/2019 CLINICAL DATA:  Chest pain, bundle branch block EXAM: CT ANGIOGRAPHY CHEST WITH CONTRAST TECHNIQUE: Multidetector CT imaging of the chest was performed using the standard protocol during bolus administration of intravenous contrast. Multiplanar CT image reconstructions and MIPs were obtained to evaluate the vascular anatomy. CONTRAST:  147mL OMNIPAQUE IOHEXOL 350 MG/ML SOLN COMPARISON:  None. FINDINGS: Cardiovascular: Borderline cardiomegaly. No pericardial effusion. Borderline dilated central pulmonary arteries. Satisfactory opacification of pulmonary arteries noted, and there is no evidence of pulmonary emboli. 3-vessel coronary calcifications. Previous CABG. Calcified atheromatous plaque throughout the descending thoracic aorta which is ectatic up to 3.1 cm diameter proximally. No dissection or stenosis. Classic 3 vessel brachiocephalic arterial origin anatomy without stenosis. Mediastinum/Nodes: No hilar or mediastinal adenopathy. Lungs/Pleura: No pleural effusion. No pneumothorax. Minimal dependent atelectasis/scarring  posteriorly in both lungs. Upper Abdomen: Cholecystectomy clips. No acute findings. Musculoskeletal: Sternotomy wires. No fracture or worrisome bone lesion. Review of the MIP images confirms the above findings. IMPRESSION: Negative for acute PE or thoracic aortic dissection. Aortic Atherosclerosis (ICD10-I70.0) with 3.1 cm fusiform dilatation of the proximal descending thoracic segment. Electronically Signed   By: Lucrezia Europe M.D.   On: 11/03/2019 14:18   ECHOCARDIOGRAM COMPLETE  Result Date: 11/03/2019    ECHOCARDIOGRAM REPORT   Patient Name:   FELICITI GENTRY Date of Exam: 11/03/2019 Medical Rec #:  [id number]      Height:       63.0 in Accession #:    [id number]     Weight:       147.0 lb Date of Birth:  August 10, 1945      BSA:          1.696 m Patient Age:    35 years       BP:           146/70 mmHg Patient Gender: F              HR:           73 bpm. Exam Location:  Inpatient Procedure: 2D Echo Indications:    CAD Native Vessel 414.01 / I25.10  History:        Patient has prior history of Echocardiogram examinations. CAD                 and non-STEMI with severe three-vessel disease, Prior CABG,  Arrythmias:LBBB; Risk Factors:Hypertension, Diabetes and                 Dyslipidemia. GERD.  Sonographer:    Darlina Sicilian RDCS Referring Phys: [place]  1. Left ventricular ejection fraction, by estimation, is 65 to 70%. The left ventricle has normal function. The left ventricle has no regional wall motion abnormalities. There is mild asymmetric left ventricular hypertrophy. Left ventricular diastolic parameters are consistent with Grade I diastolic dysfunction (impaired relaxation).  2. Right ventricular systolic function is normal. The right ventricular size is normal. There is normal pulmonary artery systolic pressure.  3. Left atrial size was mildly dilated.  4. The mitral valve is normal in structure. No evidence of mitral valve regurgitation. No evidence of mitral  stenosis.  5. The aortic valve is normal in structure. Aortic valve regurgitation is mild. No aortic stenosis is present. FINDINGS  Left Ventricle: Left ventricular ejection fraction, by estimation, is 65 to 70%. The left ventricle has normal function. The left ventricle has no regional wall motion abnormalities. The left ventricular internal cavity size was normal in size. There is  mild asymmetric left ventricular hypertrophy. Left ventricular diastolic parameters are consistent with Grade I diastolic dysfunction (impaired relaxation). Right Ventricle: The right ventricular size is normal. No increase in right ventricular wall thickness. Right ventricular systolic function is normal. There is normal pulmonary artery systolic pressure. The tricuspid regurgitant velocity is 2.52 m/s, and  with an assumed right atrial pressure of 3 mmHg, the estimated right ventricular systolic pressure is [id number] mmHg. Left Atrium: Left atrial size was mildly dilated. Right Atrium: Right atrial size was normal in size. Pericardium: There is no evidence of pericardial effusion. Mitral Valve: The mitral valve is normal in structure. No evidence of mitral valve regurgitation. No evidence of mitral valve stenosis. Tricuspid Valve: The tricuspid valve is normal in structure. Tricuspid valve regurgitation is not demonstrated. No evidence of tricuspid stenosis. Aortic Valve: The aortic valve is normal in structure. Aortic valve regurgitation is mild. Aortic regurgitation PHT measures 463 msec. No aortic stenosis is present. Pulmonic Valve: The pulmonic valve was normal in structure. Pulmonic valve regurgitation is not visualized. No evidence of pulmonic stenosis. Aorta: The aortic root and ascending aorta are structurally normal, with no evidence of dilitation. IAS/Shunts: The atrial septum is grossly normal.  LEFT VENTRICLE PLAX 2D LVIDd:         4.00 cm      Diastology LVIDs:         2.40 cm      LV e' lateral:   4.91 cm/s LV PW:          1.00 cm      LV E/e' lateral: 13.8 LV IVS:        1.30 cm      LV e' medial:    3.24 cm/s LVOT diam:     1.90 cm      LV E/e' medial:  20.9 LV SV:         48 LV SV Index:   28 LVOT Area:     2.84 cm  LV Volumes (MOD) LV vol d, MOD A2C: 131.0 ml LV vol d, MOD A4C: 111.0 ml LV vol s, MOD A2C: 52.7 ml LV vol s, MOD A4C: 56.4 ml LV SV MOD A2C:     78.3 ml LV SV MOD A4C:     111.0 ml LV SV MOD BP:      66.2 ml RIGHT  VENTRICLE RV S prime:     9.81 cm/s TAPSE (M-mode): 1.7 cm LEFT ATRIUM             Index LA diam:        3.70 cm 2.18 cm/m LA Vol (A2C):   56.5 ml 33.30 ml/m LA Vol (A4C):   63.4 ml 37.37 ml/m LA Biplane Vol: 61.0 ml 35.96 ml/m  AORTIC VALVE LVOT Vmax:   83.60 cm/s LVOT Vmean:  58.600 cm/s LVOT VTI:    0.170 m AI PHT:      463 msec  AORTA Ao Root diam: 2.80 cm MITRAL VALVE                TRICUSPID VALVE MV Area (PHT): 4.21 cm     TR Peak grad:   25.4 mmHg MV Decel Time: 180 msec     TR Vmax:        252.00 cm/s MV E velocity: 67.70 cm/s MV A velocity: 117.00 cm/s  SHUNTS MV E/A ratio:  0.58         Systemic VTI:  0.17 m                             Systemic Diam: 1.90 cm Mertie Moores MD Electronically signed by Mertie Moores MD Signature Date/Time: 11/03/2019/3:56:18 PM    Final    Disposition   Pt is being discharged home today in good condition.  Follow-up Plans & Appointments    Follow-up Information    Deberah Pelton, NP. Go on 11/19/2019.   Specialty: Cardiology Why: @11 :15am for hosptial follow up  Contact information: [address] [place] [address] [phone]          Discharge Instructions    AMB Referral to Advanced Lipid Disorders Clinic   Complete by: As directed    Tried on statins with elevated LFTs in the past.   Reason for referral: Other   Internal Lipid Clinic Referral Scheduling  Internal lipid clinic referrals are providers within [facility], who wish to refer established patients for routine management (help in starting PCSK9 inhibitor  therapy) or advanced therapies.  Internal MD referral criteria:              1. All patients with LDL>190 mg/dL  2. All patients with Triglycerides >500 mg/dL  3. Patients with suspected or confirmed heterozygous familial hyperlipidemia (HeFH) or homozygous familial hyperlipidemia (HoFH)  4. Patients with family history of suspicious for genetic dyslipidemia desiring genetic testing  5. Patients refractory to standard guideline based therapy  6. Patients with statin intolerance (failed 2 statins, one of which must be a high potency statin)  7. Patients who the provider desires to be seen by MD   Internal PharmD referral criteria:   1. Follow-up patients for medication management  2. Follow-up for compliance monitoring  3. Patients for drug education  4. Patients with statin intolerance  5. PCSK9 inhibitor education and prior authorization approvals  6. Patients with triglycerides <500 mg/dL  External Lipid Clinic Referral  External lipid clinic referrals are for providers outside of [facility], considered new clinic patients - automatically routed to MD schedule   Amb Referral to Cardiac Rehabilitation   Complete by: As directed    Diagnosis:  Coronary Stents NSTEMI     After initial evaluation and assessments completed: Virtual Based Care may be provided alone or in conjunction with Phase 2 Cardiac Rehab based on patient  barriers.: Yes       Discharge Medications     Medication List    STOP taking these medications   famotidine 20 MG tablet Commonly known as: Pepcid     TAKE these medications   amLODipine 10 MG tablet Commonly known as: NORVASC Take 10 mg by mouth daily with lunch. What changed: Another medication with the same name was removed. Continue taking this medication, and follow the directions you see here.   Artificial Tears 1.4 % ophthalmic solution Generic drug: polyvinyl alcohol Place 1 drop into both eyes daily as needed for dry eyes.     aspirin 81 MG chewable tablet Chew 1 tablet (81 mg total) by mouth daily.   atenolol 50 MG tablet Commonly known as: TENORMIN Take 75 mg by mouth daily with lunch.   cloNIDine 0.2 MG tablet Commonly known as: CATAPRES Take 1 tablet (0.2 mg total) by mouth at bedtime. What changed: when to take this   ezetimibe 10 MG tablet Commonly known as: ZETIA Take 1 tablet (10 mg total) by mouth daily.   furosemide 40 MG tablet Commonly known as: LASIX Take 40 mg by mouth daily.   gemfibrozil 600 MG tablet Commonly known as: LOPID Take 600 mg by mouth daily.   glipiZIDE 10 MG tablet Commonly known as: GLUCOTROL Take 20 mg by mouth daily with lunch.   HYDROcodone-acetaminophen 5-325 MG tablet Commonly known as: NORCO/VICODIN Take 1 tablet by mouth daily as needed (pain).   isosorbide mononitrate 30 MG 24 hr tablet Commonly known as: IMDUR Take 1 tablet (30 mg total) by mouth daily.   LAXATIVE PO Take 1 tablet by mouth 2 (two) times a week.   linagliptin 5 MG Tabs tablet Commonly known as: TRADJENTA Take 5 mg by mouth daily with lunch.   lisinopril 10 MG tablet Commonly known as: ZESTRIL Take 10 mg by mouth daily.   MAGNESIUM PO Take 0.5 tablets by mouth daily with lunch.   nitroGLYCERIN 0.4 MG SL tablet Commonly known as: NITROSTAT Place 1 tablet (0.4 mg total) under the tongue every 5 (five) minutes as needed (up to 3 doses). For chest pain What changed:   reasons to take this  additional instructions   omeprazole 20 MG capsule Commonly known as: PRILOSEC Take 20 mg by mouth daily.   PARoxetine 40 MG tablet Commonly known as: PAXIL Take 40 mg by mouth every morning.   ticagrelor 90 MG Tabs tablet Commonly known as: BRILINTA Take 1 tablet (90 mg total) by mouth 2 (two) times daily.   zolpidem 10 MG tablet Commonly known as: AMBIEN Take 5 mg by mouth at bedtime.        Yes                               AHA/ACC Clinical Performance & Quality  Measures: 1. Aspirin prescribed? - Yes 2. ADP Receptor Inhibitor (Plavix/Clopidogrel, Brilinta/Ticagrelor or Effient/Prasugrel) prescribed (includes medically managed patients)? - Yes 3. Beta Blocker prescribed? - Yes 4. High Intensity Statin (Lipitor 40-80mg  or Crestor 20-40mg ) prescribed? - No - statin intolerant 5. EF assessed during THIS hospitalization? - Yes 6. For EF <40%, was ACEI/ARB prescribed? - Yes 7. For EF <40%, Aldosterone Antagonist (Spironolactone or Eplerenone) prescribed? - Not Applicable (EF >/= [id number]) 8. Cardiac Rehab Phase II ordered (Included Medically managed Patients)? - Yes      Outstanding Labs/Studies   Outpatient lipid clinic referral  Duration of  Discharge Encounter   Greater than 30 minutes including physician time.  Signed, Reino Bellis NP-C 11/07/2019, 8:19 AM  Patient seen and examined. Agree with assessment and plan. Feels well; no recurrent chest pain.S/P PCI to SVG to circumflex with 2 stents.  Initial medical therapy for LAD potentially jeopardizing diagonal branch. Tolerating addition of nitrates.  Groin site stable. OK for DC today.   Troy Sine, MD, [facility] 11/07/2019 10:53 AM

## 2019-11-07 NOTE — Progress Notes (Signed)
CARDIAC REHAB PHASE I   PRE:  Rate/Rhythm: 79 SR  BP:  Supine: 120/69  Sitting:   Standing:    SaO2: 98%RA  MODE:  Ambulation: 470 ft   POST:  Rate/Rhythm: 89 SR  BP:  Supine:   Sitting: 158/71  Standing:    SaO2: 99%RA [id number] Pt walked 470 ft on RA with hand held asst with steady gait. No complaints. Tolerated well. Stated she is going home today.    Graylon Good, RN BSN  11/07/2019 8:33 AM

## 2019-11-07 NOTE — Telephone Encounter (Signed)
Patient is still admitted.  Will try again once discharged.

## 2019-11-09 NOTE — Progress Notes (Signed)
This RN is returning 5mg  of Oxycodone Hydrochloride to the [facility]. The one tablet was pulled under this patient, but never administered d/t patient request.   Lot# [id number] Exp: 02/2021  Medication verified by: Everlene Other, RN ([place])

## 2019-11-09 NOTE — Telephone Encounter (Signed)
Patient contacted regarding discharge from [facility] on 11/07/19.  Patient understands to follow up with provider Coletta Memos, NP on 11/19/19 at 11:15 AM at [facility]. Patient understands discharge instructions? Yes Patient understands medications and regiment? Yes Patient understands to bring all medications to this visit? Yes

## 2019-11-18 NOTE — Progress Notes (Signed)
Cardiology Clinic Note   Patient Name: [name] Date of Encounter: 11/19/2019  Primary Care Provider:  Curlene Labrum, MD Primary Cardiologist:  Kirk Ruths, MD  Patient Profile    [name] 74 year old female presents today for a follow-up evaluation s/p NSTEMI.  Past Medical History    Past Medical History:  Diagnosis Date  . Acute myocardial infarction, unspecified site, episode of care unspecified   . Coronary artery disease status post CABG 2005    a. s/p CABG 2005. b. 2011 - DES of SVG to PDA. c. Admitted with CP 03/2012 with stable anatomy by cath, felt noncardiac pain.  . Depression   . Diabetes mellitus   . Elevated LFTs   . Elevated transaminase level    Most recently October 2012 most recent  . Esophageal reflux   . GERD (gastroesophageal reflux disease)   . Hernia   . History of cholecystectomy   . Hyperlipidemia    a. Not on statin due to elevated liver enzymes.  . Hypertension   . Kidney cysts    left   Past Surgical History:  Procedure Laterality Date  . ABDOMINAL HYSTERECTOMY    . BYPASS GRAFT  08/2003   Quadruple   . CHOLECYSTECTOMY    . CORONARY STENT INTERVENTION N/A 11/05/2019   Procedure: CORONARY STENT INTERVENTION;  Surgeon: Jettie Booze, MD;  Location: [place] [facility];  Service: Cardiovascular;  Laterality: N/A;  . HERNIA REPAIR Right 01/2011  . INTRAVASCULAR ULTRASOUND/IVUS N/A 11/05/2019   Procedure: Intravascular Ultrasound/IVUS;  Surgeon: Jettie Booze, MD;  Location: [place] [facility];  Service: Cardiovascular;  Laterality: N/A;  . LEFT HEART CATH AND CORS/GRAFTS ANGIOGRAPHY N/A 11/02/2019   Procedure: LEFT HEART CATH AND CORS/GRAFTS ANGIOGRAPHY;  Surgeon: Burnell Blanks, MD;  Location: [place] [facility];  Service: Cardiovascular;  Laterality: N/A;  . LEFT HEART CATHETERIZATION WITH CORONARY/GRAFT ANGIOGRAM N/A 04/05/2012   Procedure: LEFT HEART CATHETERIZATION WITH Beatrix Fetters;   Surgeon: Sherren Mocha, MD;  Location: [facility];  Service: Cardiovascular;  Laterality: N/A;    Allergies  Allergies  Allergen Reactions  . Aspirin Nausea Only    History of Present Illness    Ms. [name] has a PMH of essential hypertension, coronary atherosclerosis, NSTEMI, GERD, diabetes, hyperlipidemia, and hypokalemia.  She underwent CABG four-vessel in 2005.  She underwent prior PCI to saphenous vein graft to the RCA.  Cardiac catheterization 8/13 revealed occlusion of all 3 native arteries.  The SVG to first and second marginal patent, SVG to PDA was patent.  The LIMA to the LAD was patent.  Her ejection fraction at that time was 55 to 65%.  She was last seen by Dr. Stanford Breed on 9/17.  During that time she was doing well.  Denied dyspnea with exertion, orthopnea, PND, lower extremity edema, palpitations, syncope, and chest pain.  She was lost to follow-up and presented to the emergency department on 11/02/2019 after having taken medication for her heartburn and not experiencing relief.  Her pain continued and began to radiate through her back.  Her EKG showed sinus rhythm with new left bundle branch block.  Her troponin was 1701.  She underwent cardiac catheterization on 11/02/2019.  It was noted that she had severe triple-vessel coronary artery disease status post four-vessel CABG with 4/4 patent bypass grafts.  However her graft to the OM had 3 areas of stenosis.  No intervention was done due to fear of possible PE and good flow  through all grafts.  PE was ruled out.  She returned to the Cath Lab on 11/05/2019 and received a DES x2 to her second marginal graft.  Her dual antiplatelet therapy was continued.  She was discharged from the hospital on 11/07/2019.  She presents the clinic today for follow-up and states she feels well.  She has had one episode of chest discomfort about 5 days ago.  She described chest as a dull ache 3/10 while she was at rest.  She took a nitroglycerin which relieved  the pain.  She has had no other episodes of chest discomfort.  She has increased her physical activity and is walking 15 to 20 minutes daily with ease.  She is following a low-sodium diet.  I will give her the salty 6 information sheet, I have encouraged her to increase her physical activity as tolerated with a goal of 30 minutes 5 times per week, and will have her follow-up with Dr. Stanford Breed in 3 months.  Today she denies chest pain, shortness of breath, lower extremity edema, fatigue, palpitations, melena, hematuria, hemoptysis, diaphoresis, weakness, presyncope, syncope, orthopnea, and PND.   Home Medications    Prior to Admission medications   Medication Sig Start Date End Date Taking? Authorizing Provider  amLODipine (NORVASC) 10 MG tablet Take 10 mg by mouth daily with lunch. 10/06/19   [provider]  aspirin 81 MG chewable tablet Chew 1 tablet (81 mg total) by mouth daily. 11/07/19   Cheryln Manly, NP  atenolol (TENORMIN) 50 MG tablet Take 75 mg by mouth daily with lunch.     [provider]  Bisacodyl (LAXATIVE PO) Take 1 tablet by mouth 2 (two) times a week.    [provider]  cloNIDine (CATAPRES) 0.2 MG tablet Take 1 tablet (0.2 mg total) by mouth at bedtime. Patient taking differently: Take 0.2 mg by mouth daily.  10/04/12   Lelon Perla, MD  ezetimibe (ZETIA) 10 MG tablet Take 1 tablet (10 mg total) by mouth daily. 11/07/19   Cheryln Manly, NP  furosemide (LASIX) 40 MG tablet Take 40 mg by mouth daily.      [provider]  gemfibrozil (LOPID) 600 MG tablet Take 600 mg by mouth daily.  07/23/14   [provider]  glipiZIDE (GLUCOTROL) 10 MG tablet Take 20 mg by mouth daily with lunch.  07/01/14   [provider]  HYDROcodone-acetaminophen (NORCO/VICODIN) 5-325 MG per tablet Take 1 tablet by mouth daily as needed (pain).     [provider]  isosorbide mononitrate (IMDUR) 30 MG 24 hr tablet Take 1 tablet (30  mg total) by mouth daily. 11/07/19   Cheryln Manly, NP  linagliptin (TRADJENTA) 5 MG TABS tablet Take 5 mg by mouth daily with lunch.    [provider]  lisinopril (ZESTRIL) 10 MG tablet Take 10 mg by mouth daily. 10/29/19   [provider]  MAGNESIUM PO Take 0.5 tablets by mouth daily with lunch.    [provider]  nitroGLYCERIN (NITROSTAT) 0.4 MG SL tablet Place 1 tablet (0.4 mg total) under the tongue every 5 (five) minutes as needed (up to 3 doses). For chest pain Patient taking differently: Place 0.4 mg under the tongue every 5 (five) minutes as needed for chest pain (up to 3 doses).  04/05/12   Dunn, Nedra Hai, PA-C  omeprazole (PRILOSEC) 20 MG capsule Take 20 mg by mouth daily. 10/19/19   [provider]  PARoxetine (PAXIL) 40 MG  tablet Take 40 mg by mouth every morning. 07/01/14   [provider]  polyvinyl alcohol (ARTIFICIAL TEARS) 1.4 % ophthalmic solution Place 1 drop into both eyes daily as needed for dry eyes.    [provider]  ticagrelor (BRILINTA) 90 MG TABS tablet Take 1 tablet (90 mg total) by mouth 2 (two) times daily. 11/07/19   Cheryln Manly, NP  zolpidem (AMBIEN) 10 MG tablet Take 5 mg by mouth at bedtime.  07/11/14   [provider]    Family History    Family History  Problem Relation Age of Onset  . Cancer Mother        stomach  . Diabetes Mother   . Heart attack Brother   . Addison's disease Sister   . Lung cancer Daughter        smoker  . Colon cancer Neg Hx    She indicated that the status of her mother is unknown. She indicated that the status of her sister is unknown. She indicated that the status of her brother is unknown. She indicated that the status of her daughter is unknown. She indicated that the status of her neg hx is unknown.  Social History    Social History   Socioeconomic History  . Marital status: Married    Spouse name: Glendell Docker  . Number of children: 2  . Years of  education: Not on file  . Highest education level: Not on file  Occupational History  . Occupation: Part-Time hair-dresser  Tobacco Use  . Smoking status: Never Smoker  . Smokeless tobacco: Never Used  Substance and Sexual Activity  . Alcohol use: No  . Drug use: No  . Sexual activity: Not on file  Other Topics Concern  . Not on file  Social History Narrative  . Not on file   Social Determinants of Health   Financial Resource Strain:   . Difficulty of Paying Living Expenses:   Food Insecurity:   . Worried About Charity fundraiser in the Last Year:   . Arboriculturist in the Last Year:   Transportation Needs:   . Film/video editor (Medical):   Marland Kitchen Lack of Transportation (Non-Medical):   Physical Activity:   . Days of Exercise per Week:   . Minutes of Exercise per Session:   Stress:   . Feeling of Stress :   Social Connections:   . Frequency of Communication with Friends and Family:   . Frequency of Social Gatherings with Friends and Family:   . Attends Religious Services:   . Active Member of Clubs or Organizations:   . Attends Archivist Meetings:   Marland Kitchen Marital Status:   Intimate Partner Violence:   . Fear of Current or Ex-Partner:   . Emotionally Abused:   Marland Kitchen Physically Abused:   . Sexually Abused:      Review of Systems    General:  No chills, fever, night sweats or weight changes.  Cardiovascular:  No chest pain, dyspnea on exertion, edema, orthopnea, palpitations, paroxysmal nocturnal dyspnea. Dermatological: No rash, lesions/masses Respiratory: No cough, dyspnea Urologic: No hematuria, dysuria Abdominal:   No nausea, vomiting, diarrhea, bright red blood per rectum, melena, or hematemesis Neurologic:  No visual changes, wkns, changes in mental status. All other systems reviewed and are otherwise negative except as noted above.  Physical Exam    VS:  BP 128/68   Pulse 69   Ht 5\' 3"  (1.6 m)   Wt 144  lb 12.8 oz (65.7 kg)   SpO2 97%   BMI  25.65 kg/m  , BMI Body mass index is 25.65 kg/m. GEN: Well nourished, well developed, in no acute distress. HEENT: normal. Neck: Supple, no JVD, carotid bruits, or masses. Cardiac: RRR, no murmurs, rubs, or gallops. No clubbing, cyanosis, edema.  Radials/DP/PT 2+ and equal bilaterally.  Respiratory:  Respirations regular and unlabored, clear to auscultation bilaterally. GI: Soft, nontender, nondistended, BS + x 4. MS: no deformity or atrophy. Skin: warm and dry, no rash. Neuro:  Strength and sensation are intact. Psych: Normal affect.  Accessory Clinical Findings    ECG personally reviewed by me today-normal sinus rhythm left bundle branch block 65 bpm.  Echocardiogram 11/03/2019 IMPRESSIONS    1. Left ventricular ejection fraction, by estimation, is 65 to 70%. The  left ventricle has normal function. The left ventricle has no regional  wall motion abnormalities. There is mild asymmetric left ventricular  hypertrophy. Left ventricular diastolic  parameters are consistent with Grade I diastolic dysfunction (impaired  relaxation).  2. Right ventricular systolic function is normal. The right ventricular  size is normal. There is normal pulmonary artery systolic pressure.  3. Left atrial size was mildly dilated.  4. The mitral valve is normal in structure. No evidence of mitral valve  regurgitation. No evidence of mitral stenosis.  5. The aortic valve is normal in structure. Aortic valve regurgitation is  mild. No aortic stenosis is present.  Cardiac catheterization 11/02/2019 showed patency and 4/4 bypass grafts.  With 3 areas of stenosis in her OM graft.  No intervention at that time D/T concern for PE.  Cardiac catheterization 11/05/2019   Only imaged SVG to OM.  Origin to Prox Graft lesion before 2nd Mrg is 70% stenosed. A drug-eluting stent was successfully placed using a SYNERGY XD 3.50X28, post dilated to 3.8 and optimized by IVUS.  Post intervention, there is a 0%  residual stenosis.  Mid Graft lesion before 2nd Mrg is 90% stenosed. A drug-eluting stent was successfully placed using a SYNERGY XD 3.50X12, optimized with IVUS.  Post intervention, there is a 0% residual stenosis.  Dist Graft to Insertion lesion before 2nd Mrg is 50% stenosed.   Continue dual antiplatelet therapy and plan for discharge tomorrow.   Diagnostic Dominance: Right  Intervention     Assessment & Plan   1.  NSTEMI-cardiac catheterization x2 11/02/2019, and 11/05/2019.  Received DES x2 on 10/28/2019 to her LM graft.  Echocardiogram 11/03/2019 showed LVEF of 65 to 70%, mild LVH, G1 DD, left atrium mildly dilated, and mild aortic valve regurgitation.   Continue Brilinta Continue aspirin Continue Imdur 30 mg daily Continue amlodipine 10 mg Continue atenolol 75 mg Continue clonidine 0.2 mg at bedtime Continue ezetimibe Continue Lopid 600 mg daily Continue lisinopril 10 mg daily Heart healthy low-sodium diet Increase physical activity as tolerated-goal 30 minutes 5 times per week  Essential hypertension-BP today 128/68.  Well-controlled at home Continue amlodipine 10 mg daily Continue atenolol 75 mg Continue clonidine 0.2 mg Continue lisinopril 10 mg Heart healthy low-sodium diet-salty 6 given Increase physical activity as tolerated Blood pressure log given  Hyperlipidemia goal LDL less than 70-3/28/2021: Cholesterol 196; HDL 35; LDL Cholesterol 83; Triglycerides 392; VLDL 78 Continue ezetimibe 10 mg daily Continue Lopid 600 mg daily Heart healthy low-sodium high-fiber diet Followed by PCP  Diabetes mellitus-A1c 8.1 on 11/03/2019 Continue glipizide Continue Tradjenta Heart healthy low-sodium carb mod diet Increase physical activity as tolerated Followed by PCP  Disposition: Follow-up  with Dr. Stanford Breed in 3 months.   Jossie Ng. [facility] [place] [address] Office [phone] Fax [phone]

## 2019-11-19 ENCOUNTER — Ambulatory Visit (INDEPENDENT_AMBULATORY_CARE_PROVIDER_SITE_OTHER): Payer: Medicare Other | Admitting: General Practice

## 2019-11-19 ENCOUNTER — Encounter: Payer: Self-pay | Admitting: General Practice

## 2019-11-19 ENCOUNTER — Other Ambulatory Visit: Payer: Self-pay

## 2019-11-19 VITALS — BP 128/68 | HR 69 | Ht 63.0 in | Wt 144.8 lb

## 2019-11-19 DIAGNOSIS — I10 Essential (primary) hypertension: Secondary | ICD-10-CM

## 2019-11-19 DIAGNOSIS — E0822 Diabetes mellitus due to underlying condition with diabetic chronic kidney disease: Secondary | ICD-10-CM

## 2019-11-19 DIAGNOSIS — I214 Non-ST elevation (NSTEMI) myocardial infarction: Secondary | ICD-10-CM | POA: Diagnosis not present

## 2019-11-19 DIAGNOSIS — E7800 Pure hypercholesterolemia, unspecified: Secondary | ICD-10-CM | POA: Diagnosis not present

## 2019-11-19 DIAGNOSIS — N181 Chronic kidney disease, stage 1: Secondary | ICD-10-CM | POA: Diagnosis not present

## 2019-11-19 NOTE — Patient Instructions (Signed)
Medication Instructions:  The current medical regimen is effective;  continue present plan and medications as directed. Please refer to the Current Medication list given to you today. *If you need a refill on your cardiac medications before your next appointment, please call your pharmacy*  Special Instructions Take and log you blood pressure daily- 1hr after taking your medications.  PLEASE READ AND FOLLOW SALTY 6-ATTACHED  INCREASE PHYSICAL ACTIVITY AS TOLERATED. GOAL IS 30 MINUTES/DAY -OR- 150 MIN/WEEK  Follow-Up: Your next appointment:  3 month(s) Please call our office 2 months in advance to schedule this appointment In Person with K. Mali Hilty, MD  At [facility], you and your health needs are our priority.  As part of our continuing mission to provide you with exceptional heart care, we have created designated Provider Care Teams.  These Care Teams include your primary Cardiologist (physician) and Advanced Practice Providers (APPs -  Physician Assistants and Nurse Practitioners) who all work together to provide you with the care you need, when you need it.  We recommend signing up for the patient portal called "MyChart".  Sign up information is provided on this After Visit Summary.  MyChart is used to connect with patients for Virtual Visits (Telemedicine).  Patients are able to view lab/test results, encounter notes, upcoming appointments, etc.  Non-urgent messages can be sent to your provider as well.   To learn more about what you can do with MyChart, go to [email or web address].

## 2019-11-20 DIAGNOSIS — E559 Vitamin D deficiency, unspecified: Secondary | ICD-10-CM | POA: Diagnosis not present

## 2019-11-20 DIAGNOSIS — K219 Gastro-esophageal reflux disease without esophagitis: Secondary | ICD-10-CM | POA: Diagnosis not present

## 2019-11-20 DIAGNOSIS — E1165 Type 2 diabetes mellitus with hyperglycemia: Secondary | ICD-10-CM | POA: Diagnosis not present

## 2019-11-20 DIAGNOSIS — E782 Mixed hyperlipidemia: Secondary | ICD-10-CM | POA: Diagnosis not present

## 2019-11-20 DIAGNOSIS — E039 Hypothyroidism, unspecified: Secondary | ICD-10-CM | POA: Diagnosis not present

## 2019-11-20 DIAGNOSIS — R945 Abnormal results of liver function studies: Secondary | ICD-10-CM | POA: Diagnosis not present

## 2019-11-26 DIAGNOSIS — Z951 Presence of aortocoronary bypass graft: Secondary | ICD-10-CM | POA: Diagnosis not present

## 2019-11-26 DIAGNOSIS — I25810 Atherosclerosis of coronary artery bypass graft(s) without angina pectoris: Secondary | ICD-10-CM | POA: Diagnosis not present

## 2019-11-26 DIAGNOSIS — I10 Essential (primary) hypertension: Secondary | ICD-10-CM | POA: Diagnosis not present

## 2019-11-26 DIAGNOSIS — Z6824 Body mass index (BMI) 24.0-24.9, adult: Secondary | ICD-10-CM | POA: Diagnosis not present

## 2019-11-26 DIAGNOSIS — E1165 Type 2 diabetes mellitus with hyperglycemia: Secondary | ICD-10-CM | POA: Diagnosis not present

## 2020-02-20 NOTE — Progress Notes (Signed)
HPI: FU coronary artery disease status post coronary bypassing graft in 2005. She has had prior PCI of the saphenous vein graft to the right coronary artery.  Patient had non-ST elevation myocardial infarction March 2021.  Echocardiogram showed normal LV function, mild LVH, grade 1 diastolic dysfunction, mild left atrial enlargement and mild aortic insufficiency.  She had severe three-vessel coronary artery disease; saphenous vein graft to right coronary artery had a 40% lesion, saphenous vein graft to the second marginal had a small 70% lesion followed by 90% and patent LIMA to the LAD.  Patient had drug-eluting stent to proximal and mid vein graft to OM.  CTA March 2021 showed no pulmonary embolus.  There was 3.1 cm dilatation of the proximal descending thoracic segment.  Since last seen, she has dyspnea with more vigorous activities but not routine activities.  No chest pain, palpitations or syncope.  Occasional mild dizziness.  Current Outpatient Medications  Medication Sig Dispense Refill  . amLODipine (NORVASC) 10 MG tablet Take 10 mg by mouth daily with lunch.    Marland Kitchen aspirin 81 MG chewable tablet Chew 1 tablet (81 mg total) by mouth daily. 90 tablet 1  . atenolol (TENORMIN) 50 MG tablet Take 75 mg by mouth daily with lunch.     . Bisacodyl (LAXATIVE PO) Take 1 tablet by mouth 2 (two) times a week.    . cloNIDine (CATAPRES) 0.2 MG tablet Take 1 tablet (0.2 mg total) by mouth at bedtime. (Patient taking differently: Take 0.2 mg by mouth daily. ) 30 tablet 12  . ezetimibe (ZETIA) 10 MG tablet Take 1 tablet (10 mg total) by mouth daily. 90 tablet 1  . furosemide (LASIX) 40 MG tablet Take 40 mg by mouth daily.      Marland Kitchen gemfibrozil (LOPID) 600 MG tablet Take 600 mg by mouth daily.     Marland Kitchen glipiZIDE (GLUCOTROL) 10 MG tablet Take 20 mg by mouth daily with lunch.     Marland Kitchen HYDROcodone-acetaminophen (NORCO/VICODIN) 5-325 MG per tablet Take 1 tablet by mouth daily as needed (pain).     . isosorbide  mononitrate (IMDUR) 30 MG 24 hr tablet Take 1 tablet (30 mg total) by mouth daily. 90 tablet 1  . linagliptin (TRADJENTA) 5 MG TABS tablet Take 5 mg by mouth daily with lunch.    . lisinopril (ZESTRIL) 10 MG tablet Take 10 mg by mouth daily.    Marland Kitchen MAGNESIUM PO Take 0.5 tablets by mouth daily with lunch.    . nitroGLYCERIN (NITROSTAT) 0.4 MG SL tablet Place 1 tablet (0.4 mg total) under the tongue every 5 (five) minutes as needed (up to 3 doses). For chest pain (Patient taking differently: Place 0.4 mg under the tongue every 5 (five) minutes as needed for chest pain (up to 3 doses). )    . omeprazole (PRILOSEC) 20 MG capsule Take 20 mg by mouth daily.    Marland Kitchen PARoxetine (PAXIL) 40 MG tablet Take 40 mg by mouth every morning.    . polyvinyl alcohol (ARTIFICIAL TEARS) 1.4 % ophthalmic solution Place 1 drop into both eyes daily as needed for dry eyes.    . RYBELSUS 3 MG TABS Take 1 tablet by mouth every morning.    . ticagrelor (BRILINTA) 90 MG TABS tablet Take 1 tablet (90 mg total) by mouth 2 (two) times daily. 180 tablet 2  . zolpidem (AMBIEN) 10 MG tablet Take 5 mg by mouth at bedtime.      No current facility-administered medications  for this visit.     Past Medical History:  Diagnosis Date  . Acute myocardial infarction, unspecified site, episode of care unspecified   . Coronary artery disease status post CABG 2005    a. s/p CABG 2005. b. 2011 - DES of SVG to PDA. c. Admitted with CP 03/2012 with stable anatomy by cath, felt noncardiac pain.  . Depression   . Diabetes mellitus   . Elevated LFTs   . Elevated transaminase level    Most recently October 2012 most recent  . Esophageal reflux   . GERD (gastroesophageal reflux disease)   . Hernia   . History of cholecystectomy   . Hyperlipidemia    a. Not on statin due to elevated liver enzymes.  . Hypertension   . Kidney cysts    left    Past Surgical History:  Procedure Laterality Date  . ABDOMINAL HYSTERECTOMY    . BYPASS GRAFT   08/2003   Quadruple   . CHOLECYSTECTOMY    . CORONARY STENT INTERVENTION N/A 11/05/2019   Procedure: CORONARY STENT INTERVENTION;  Surgeon: Jettie Booze, MD;  Location: [facility];  Service: Cardiovascular;  Laterality: N/A;  . HERNIA REPAIR Right 01/2011  . INTRAVASCULAR ULTRASOUND/IVUS N/A 11/05/2019   Procedure: Intravascular Ultrasound/IVUS;  Surgeon: Jettie Booze, MD;  Location: [facility];  Service: Cardiovascular;  Laterality: N/A;  . LEFT HEART CATH AND CORS/GRAFTS ANGIOGRAPHY N/A 11/02/2019   Procedure: LEFT HEART CATH AND CORS/GRAFTS ANGIOGRAPHY;  Surgeon: Burnell Blanks, MD;  Location: [facility];  Service: Cardiovascular;  Laterality: N/A;  . LEFT HEART CATHETERIZATION WITH CORONARY/GRAFT ANGIOGRAM N/A 04/05/2012   Procedure: LEFT HEART CATHETERIZATION WITH Beatrix Fetters;  Surgeon: Sherren Mocha, MD;  Location: [facility];  Service: Cardiovascular;  Laterality: N/A;    Social History   Socioeconomic History  . Marital status: Married    Spouse name: Glendell Docker  . Number of children: 2  . Years of education: Not on file  . Highest education level: Not on file  Occupational History  . Occupation: Part-Time hair-dresser  Tobacco Use  . Smoking status: Never Smoker  . Smokeless tobacco: Never Used  Substance and Sexual Activity  . Alcohol use: No  . Drug use: No  . Sexual activity: Not on file  Other Topics Concern  . Not on file  Social History Narrative  . Not on file   Social Determinants of Health   Financial Resource Strain:   . Difficulty of Paying Living Expenses:   Food Insecurity:   . Worried About Charity fundraiser in the Last Year:   . Arboriculturist in the Last Year:   Transportation Needs:   . Film/video editor (Medical):   Marland Kitchen Lack of Transportation (Non-Medical):   Physical Activity:   . Days of Exercise per Week:   . Minutes of Exercise per Session:   Stress:   . Feeling of Stress :     Social Connections:   . Frequency of Communication with Friends and Family:   . Frequency of Social Gatherings with Friends and Family:   . Attends Religious Services:   . Active Member of Clubs or Organizations:   . Attends Archivist Meetings:   Marland Kitchen Marital Status:   Intimate Partner Violence:   . Fear of Current or Ex-Partner:   . Emotionally Abused:   Marland Kitchen Physically Abused:   . Sexually Abused:     Family History  Problem  Relation Age of Onset  . Cancer Mother        stomach  . Diabetes Mother   . Heart attack Brother   . Addison's disease Sister   . Lung cancer Daughter        smoker  . Colon cancer Neg Hx     ROS: no fevers or chills, productive cough, hemoptysis, dysphasia, odynophagia, melena, hematochezia, dysuria, hematuria, rash, seizure activity, orthopnea, PND, pedal edema, claudication. Remaining systems are negative.  Physical Exam: Well-developed well-nourished in no acute distress.  Skin is warm and dry.  HEENT is normal.  Neck is supple.  Chest is clear to auscultation with normal expansion.  Cardiovascular exam is regular rate and rhythm.  Abdominal exam nontender or distended. No masses palpated. Extremities show no edema. neuro grossly intact  A/P  1 coronary artery disease-patient denies recurrent chest pain.  Plan to continue aspirin, Brilinta; she has previously declined statins.  2 hyperlipidemia-Continue present medications.  Not on statin as she had increased liver functions in the past with this therapy.  She declines other lipid-lowering medications.  3 hypertension-blood pressure controlled.  Continue present medications and follow.  Kirk Ruths, MD

## 2020-02-26 DIAGNOSIS — I10 Essential (primary) hypertension: Secondary | ICD-10-CM | POA: Diagnosis not present

## 2020-02-26 DIAGNOSIS — E039 Hypothyroidism, unspecified: Secondary | ICD-10-CM | POA: Diagnosis not present

## 2020-02-26 DIAGNOSIS — E1165 Type 2 diabetes mellitus with hyperglycemia: Secondary | ICD-10-CM | POA: Diagnosis not present

## 2020-02-26 DIAGNOSIS — K760 Fatty (change of) liver, not elsewhere classified: Secondary | ICD-10-CM | POA: Diagnosis not present

## 2020-02-26 DIAGNOSIS — E782 Mixed hyperlipidemia: Secondary | ICD-10-CM | POA: Diagnosis not present

## 2020-02-29 DIAGNOSIS — E782 Mixed hyperlipidemia: Secondary | ICD-10-CM | POA: Diagnosis not present

## 2020-02-29 DIAGNOSIS — Z6824 Body mass index (BMI) 24.0-24.9, adult: Secondary | ICD-10-CM | POA: Diagnosis not present

## 2020-02-29 DIAGNOSIS — E1165 Type 2 diabetes mellitus with hyperglycemia: Secondary | ICD-10-CM | POA: Diagnosis not present

## 2020-02-29 DIAGNOSIS — I10 Essential (primary) hypertension: Secondary | ICD-10-CM | POA: Diagnosis not present

## 2020-02-29 DIAGNOSIS — Z0001 Encounter for general adult medical examination with abnormal findings: Secondary | ICD-10-CM | POA: Diagnosis not present

## 2020-03-03 ENCOUNTER — Encounter: Payer: Self-pay | Admitting: Cardiology

## 2020-03-03 ENCOUNTER — Other Ambulatory Visit: Payer: Self-pay

## 2020-03-03 ENCOUNTER — Ambulatory Visit (INDEPENDENT_AMBULATORY_CARE_PROVIDER_SITE_OTHER): Payer: Medicare Other | Admitting: Cardiology

## 2020-03-03 VITALS — BP 126/64 | HR 67 | Ht 63.0 in | Wt 140.2 lb

## 2020-03-03 DIAGNOSIS — I10 Essential (primary) hypertension: Secondary | ICD-10-CM | POA: Diagnosis not present

## 2020-03-03 DIAGNOSIS — K719 Toxic liver disease, unspecified: Secondary | ICD-10-CM

## 2020-03-03 DIAGNOSIS — E7800 Pure hypercholesterolemia, unspecified: Secondary | ICD-10-CM

## 2020-03-03 DIAGNOSIS — T466X5A Adverse effect of antihyperlipidemic and antiarteriosclerotic drugs, initial encounter: Secondary | ICD-10-CM

## 2020-03-03 DIAGNOSIS — I2510 Atherosclerotic heart disease of native coronary artery without angina pectoris: Secondary | ICD-10-CM | POA: Diagnosis not present

## 2020-03-03 NOTE — Patient Instructions (Signed)

## 2020-03-21 DIAGNOSIS — T466X5A Adverse effect of antihyperlipidemic and antiarteriosclerotic drugs, initial encounter: Secondary | ICD-10-CM | POA: Insufficient documentation

## 2020-03-21 DIAGNOSIS — K719 Toxic liver disease, unspecified: Secondary | ICD-10-CM | POA: Insufficient documentation

## 2020-05-29 DIAGNOSIS — E876 Hypokalemia: Secondary | ICD-10-CM | POA: Diagnosis not present

## 2020-05-29 DIAGNOSIS — E1165 Type 2 diabetes mellitus with hyperglycemia: Secondary | ICD-10-CM | POA: Diagnosis not present

## 2020-05-29 DIAGNOSIS — I10 Essential (primary) hypertension: Secondary | ICD-10-CM | POA: Diagnosis not present

## 2020-05-29 DIAGNOSIS — N183 Chronic kidney disease, stage 3 unspecified: Secondary | ICD-10-CM | POA: Diagnosis not present

## 2020-05-29 DIAGNOSIS — E782 Mixed hyperlipidemia: Secondary | ICD-10-CM | POA: Diagnosis not present

## 2020-05-29 DIAGNOSIS — K219 Gastro-esophageal reflux disease without esophagitis: Secondary | ICD-10-CM | POA: Diagnosis not present

## 2020-06-02 DIAGNOSIS — E1165 Type 2 diabetes mellitus with hyperglycemia: Secondary | ICD-10-CM | POA: Diagnosis not present

## 2020-06-02 DIAGNOSIS — Z6823 Body mass index (BMI) 23.0-23.9, adult: Secondary | ICD-10-CM | POA: Diagnosis not present

## 2020-06-02 DIAGNOSIS — E782 Mixed hyperlipidemia: Secondary | ICD-10-CM | POA: Diagnosis not present

## 2020-06-02 DIAGNOSIS — Z23 Encounter for immunization: Secondary | ICD-10-CM | POA: Diagnosis not present

## 2020-06-02 DIAGNOSIS — I25810 Atherosclerosis of coronary artery bypass graft(s) without angina pectoris: Secondary | ICD-10-CM | POA: Diagnosis not present

## 2020-06-02 DIAGNOSIS — I10 Essential (primary) hypertension: Secondary | ICD-10-CM | POA: Diagnosis not present

## 2020-06-07 DIAGNOSIS — N183 Chronic kidney disease, stage 3 unspecified: Secondary | ICD-10-CM | POA: Diagnosis not present

## 2020-06-07 DIAGNOSIS — I5032 Chronic diastolic (congestive) heart failure: Secondary | ICD-10-CM | POA: Diagnosis not present

## 2020-06-07 DIAGNOSIS — I130 Hypertensive heart and chronic kidney disease with heart failure and stage 1 through stage 4 chronic kidney disease, or unspecified chronic kidney disease: Secondary | ICD-10-CM | POA: Diagnosis not present

## 2020-06-07 DIAGNOSIS — E1122 Type 2 diabetes mellitus with diabetic chronic kidney disease: Secondary | ICD-10-CM | POA: Diagnosis not present

## 2020-07-08 DIAGNOSIS — N183 Chronic kidney disease, stage 3 unspecified: Secondary | ICD-10-CM | POA: Diagnosis not present

## 2020-07-08 DIAGNOSIS — I130 Hypertensive heart and chronic kidney disease with heart failure and stage 1 through stage 4 chronic kidney disease, or unspecified chronic kidney disease: Secondary | ICD-10-CM | POA: Diagnosis not present

## 2020-07-08 DIAGNOSIS — E1122 Type 2 diabetes mellitus with diabetic chronic kidney disease: Secondary | ICD-10-CM | POA: Diagnosis not present

## 2020-07-08 DIAGNOSIS — I5032 Chronic diastolic (congestive) heart failure: Secondary | ICD-10-CM | POA: Diagnosis not present

## 2020-07-29 DIAGNOSIS — Z6822 Body mass index (BMI) 22.0-22.9, adult: Secondary | ICD-10-CM | POA: Diagnosis not present

## 2020-07-29 DIAGNOSIS — I25810 Atherosclerosis of coronary artery bypass graft(s) without angina pectoris: Secondary | ICD-10-CM | POA: Diagnosis not present

## 2020-07-29 DIAGNOSIS — M545 Low back pain, unspecified: Secondary | ICD-10-CM | POA: Diagnosis not present

## 2020-08-06 DIAGNOSIS — R29898 Other symptoms and signs involving the musculoskeletal system: Secondary | ICD-10-CM | POA: Diagnosis not present

## 2020-08-06 DIAGNOSIS — M2569 Stiffness of other specified joint, not elsewhere classified: Secondary | ICD-10-CM | POA: Diagnosis not present

## 2020-08-06 DIAGNOSIS — M6281 Muscle weakness (generalized): Secondary | ICD-10-CM | POA: Diagnosis not present

## 2020-08-06 DIAGNOSIS — M545 Low back pain, unspecified: Secondary | ICD-10-CM | POA: Diagnosis not present

## 2020-08-08 DIAGNOSIS — I5032 Chronic diastolic (congestive) heart failure: Secondary | ICD-10-CM | POA: Diagnosis not present

## 2020-08-08 DIAGNOSIS — E1122 Type 2 diabetes mellitus with diabetic chronic kidney disease: Secondary | ICD-10-CM | POA: Diagnosis not present

## 2020-08-08 DIAGNOSIS — N183 Chronic kidney disease, stage 3 unspecified: Secondary | ICD-10-CM | POA: Diagnosis not present

## 2020-08-08 DIAGNOSIS — I130 Hypertensive heart and chronic kidney disease with heart failure and stage 1 through stage 4 chronic kidney disease, or unspecified chronic kidney disease: Secondary | ICD-10-CM | POA: Diagnosis not present

## 2020-08-13 DIAGNOSIS — R29898 Other symptoms and signs involving the musculoskeletal system: Secondary | ICD-10-CM | POA: Diagnosis not present

## 2020-08-13 DIAGNOSIS — M2569 Stiffness of other specified joint, not elsewhere classified: Secondary | ICD-10-CM | POA: Diagnosis not present

## 2020-08-13 DIAGNOSIS — M6281 Muscle weakness (generalized): Secondary | ICD-10-CM | POA: Diagnosis not present

## 2020-08-13 DIAGNOSIS — M545 Low back pain, unspecified: Secondary | ICD-10-CM | POA: Diagnosis not present

## 2020-08-20 DIAGNOSIS — M545 Low back pain, unspecified: Secondary | ICD-10-CM | POA: Diagnosis not present

## 2020-08-20 DIAGNOSIS — M6281 Muscle weakness (generalized): Secondary | ICD-10-CM | POA: Diagnosis not present

## 2020-08-20 DIAGNOSIS — M2569 Stiffness of other specified joint, not elsewhere classified: Secondary | ICD-10-CM | POA: Diagnosis not present

## 2020-08-20 DIAGNOSIS — R29898 Other symptoms and signs involving the musculoskeletal system: Secondary | ICD-10-CM | POA: Diagnosis not present

## 2020-08-27 DIAGNOSIS — N183 Chronic kidney disease, stage 3 unspecified: Secondary | ICD-10-CM | POA: Diagnosis not present

## 2020-08-27 DIAGNOSIS — E559 Vitamin D deficiency, unspecified: Secondary | ICD-10-CM | POA: Diagnosis not present

## 2020-08-27 DIAGNOSIS — E7849 Other hyperlipidemia: Secondary | ICD-10-CM | POA: Diagnosis not present

## 2020-08-27 DIAGNOSIS — E1165 Type 2 diabetes mellitus with hyperglycemia: Secondary | ICD-10-CM | POA: Diagnosis not present

## 2020-08-27 DIAGNOSIS — E782 Mixed hyperlipidemia: Secondary | ICD-10-CM | POA: Diagnosis not present

## 2020-08-27 DIAGNOSIS — E876 Hypokalemia: Secondary | ICD-10-CM | POA: Diagnosis not present

## 2020-08-27 DIAGNOSIS — I10 Essential (primary) hypertension: Secondary | ICD-10-CM | POA: Diagnosis not present

## 2020-09-01 DIAGNOSIS — I10 Essential (primary) hypertension: Secondary | ICD-10-CM | POA: Diagnosis not present

## 2020-09-01 DIAGNOSIS — E559 Vitamin D deficiency, unspecified: Secondary | ICD-10-CM | POA: Diagnosis not present

## 2020-09-01 DIAGNOSIS — Z955 Presence of coronary angioplasty implant and graft: Secondary | ICD-10-CM | POA: Diagnosis not present

## 2020-09-01 DIAGNOSIS — E7849 Other hyperlipidemia: Secondary | ICD-10-CM | POA: Diagnosis not present

## 2020-09-01 DIAGNOSIS — E1165 Type 2 diabetes mellitus with hyperglycemia: Secondary | ICD-10-CM | POA: Diagnosis not present

## 2020-09-01 DIAGNOSIS — I25810 Atherosclerosis of coronary artery bypass graft(s) without angina pectoris: Secondary | ICD-10-CM | POA: Diagnosis not present

## 2020-09-01 DIAGNOSIS — E039 Hypothyroidism, unspecified: Secondary | ICD-10-CM | POA: Diagnosis not present

## 2020-09-01 NOTE — Progress Notes (Deleted)
HPI: FUcoronary artery disease status post coronary bypassing graft in 2005. She has had prior PCI of the saphenous vein graft to the right coronary artery.  Patient had non-ST elevation myocardial infarction March 2021.  Echocardiogram showed normal LV function, mild LVH, grade 1 diastolic dysfunction, mild left atrial enlargement and mild aortic insufficiency.  She had severe three-vessel coronary artery disease; saphenous vein graft to right coronary artery had a 40% lesion, saphenous vein graft to the second marginal had a small 70% lesion followed by 90% and patent LIMA to the LAD.  Patient had drug-eluting stent to proximal and mid vein graft to OM.  CTA March 2021 showed no pulmonary embolus.  There was 3.1 cm dilatation of the proximal descending thoracic segment.  Sincelast seen,   Current Outpatient Medications  Medication Sig Dispense Refill  . amLODipine (NORVASC) 10 MG tablet Take 10 mg by mouth daily with lunch.    Marland Kitchen aspirin 81 MG chewable tablet Chew 1 tablet (81 mg total) by mouth daily. 90 tablet 1  . atenolol (TENORMIN) 50 MG tablet Take 75 mg by mouth daily with lunch.     . Bisacodyl (LAXATIVE PO) Take 1 tablet by mouth 2 (two) times a week.    . cloNIDine (CATAPRES) 0.2 MG tablet Take 1 tablet (0.2 mg total) by mouth at bedtime. (Patient taking differently: Take 0.2 mg by mouth daily. ) 30 tablet 12  . ezetimibe (ZETIA) 10 MG tablet Take 1 tablet (10 mg total) by mouth daily. 90 tablet 1  . furosemide (LASIX) 40 MG tablet Take 40 mg by mouth daily.      Marland Kitchen gemfibrozil (LOPID) 600 MG tablet Take 600 mg by mouth daily.     Marland Kitchen glipiZIDE (GLUCOTROL) 10 MG tablet Take 20 mg by mouth daily with lunch.     Marland Kitchen HYDROcodone-acetaminophen (NORCO/VICODIN) 5-325 MG per tablet Take 1 tablet by mouth daily as needed (pain).     . isosorbide mononitrate (IMDUR) 30 MG 24 hr tablet Take 1 tablet (30 mg total) by mouth daily. 90 tablet 1  . linagliptin (TRADJENTA) 5 MG TABS tablet Take 5 mg  by mouth daily with lunch.    . lisinopril (ZESTRIL) 10 MG tablet Take 10 mg by mouth daily.    Marland Kitchen MAGNESIUM PO Take 0.5 tablets by mouth daily with lunch.    . nitroGLYCERIN (NITROSTAT) 0.4 MG SL tablet Place 1 tablet (0.4 mg total) under the tongue every 5 (five) minutes as needed (up to 3 doses). For chest pain (Patient taking differently: Place 0.4 mg under the tongue every 5 (five) minutes as needed for chest pain (up to 3 doses). )    . omeprazole (PRILOSEC) 20 MG capsule Take 20 mg by mouth daily.    Marland Kitchen PARoxetine (PAXIL) 40 MG tablet Take 40 mg by mouth every morning.    . polyvinyl alcohol (ARTIFICIAL TEARS) 1.4 % ophthalmic solution Place 1 drop into both eyes daily as needed for dry eyes.    . RYBELSUS 3 MG TABS Take 1 tablet by mouth every morning.    . ticagrelor (BRILINTA) 90 MG TABS tablet Take 1 tablet (90 mg total) by mouth 2 (two) times daily. 180 tablet 2  . zolpidem (AMBIEN) 10 MG tablet Take 5 mg by mouth at bedtime.      No current facility-administered medications for this visit.     Past Medical History:  Diagnosis Date  . Acute myocardial infarction, unspecified site, episode of care  unspecified   . Coronary artery disease status post CABG 2005    a. s/p CABG 2005. b. 2011 - DES of SVG to PDA. c. Admitted with CP 03/2012 with stable anatomy by cath, felt noncardiac pain.  . Depression   . Diabetes mellitus   . Elevated LFTs   . Elevated transaminase level    Most recently October 2012 most recent  . Esophageal reflux   . GERD (gastroesophageal reflux disease)   . Hernia   . History of cholecystectomy   . Hyperlipidemia    a. Not on statin due to elevated liver enzymes.  . Hypertension   . Kidney cysts    left    Past Surgical History:  Procedure Laterality Date  . ABDOMINAL HYSTERECTOMY    . BYPASS GRAFT  08/2003   Quadruple   . CHOLECYSTECTOMY    . CORONARY STENT INTERVENTION N/A 11/05/2019   Procedure: CORONARY STENT INTERVENTION;  Surgeon: Jettie Booze, MD;  Location: [facility];  Service: Cardiovascular;  Laterality: N/A;  . HERNIA REPAIR Right 01/2011  . INTRAVASCULAR ULTRASOUND/IVUS N/A 11/05/2019   Procedure: Intravascular Ultrasound/IVUS;  Surgeon: Jettie Booze, MD;  Location: [place];  Service: Cardiovascular;  Laterality: N/A;  . LEFT HEART CATH AND CORS/GRAFTS ANGIOGRAPHY N/A 11/02/2019   Procedure: LEFT HEART CATH AND CORS/GRAFTS ANGIOGRAPHY;  Surgeon: Burnell Blanks, MD;  Location: [place] [facility];  Service: Cardiovascular;  Laterality: N/A;  . LEFT HEART CATHETERIZATION WITH CORONARY/GRAFT ANGIOGRAM N/A 04/05/2012   Procedure: LEFT HEART CATHETERIZATION WITH Beatrix Fetters;  Surgeon: Sherren Mocha, MD;  Location: [facility];  Service: Cardiovascular;  Laterality: N/A;    Social History   Socioeconomic History  . Marital status: Married    Spouse name: Glendell Docker  . Number of children: 2  . Years of education: Not on file  . Highest education level: Not on file  Occupational History  . Occupation: Part-Time hair-dresser  Tobacco Use  . Smoking status: Never Smoker  . Smokeless tobacco: Never Used  Substance and Sexual Activity  . Alcohol use: No  . Drug use: No  . Sexual activity: Not on file  Other Topics Concern  . Not on file  Social History Narrative  . Not on file   Social Determinants of Health   Financial Resource Strain: Not on file  Food Insecurity: Not on file  Transportation Needs: Not on file  Physical Activity: Not on file  Stress: Not on file  Social Connections: Not on file  Intimate Partner Violence: Not on file    Family History  Problem Relation Age of Onset  . Cancer Mother        stomach  . Diabetes Mother   . Heart attack Brother   . Addison's disease Sister   . Lung cancer Daughter        smoker  . Colon cancer Neg Hx     ROS: no fevers or chills, productive cough, hemoptysis, dysphasia, odynophagia, melena, hematochezia,  dysuria, hematuria, rash, seizure activity, orthopnea, PND, pedal edema, claudication. Remaining systems are negative.  Physical Exam: Well-developed well-nourished in no acute distress.  Skin is warm and dry.  HEENT is normal.  Neck is supple.  Chest is clear to auscultation with normal expansion.  Cardiovascular exam is regular rate and rhythm.  Abdominal exam nontender or distended. No masses palpated. Extremities show no edema. neuro grossly intact  ECG- personally reviewed  A/P  1 coronary artery disease status post  coronary artery bypass graft-patient has not had recurrent symptoms.  Plan to continue aspirin and Brilinta.  We will discontinue Brilinta April 2022.  Had increased liver functions with statins previously.  2 hyperlipidemia-patient declines other lipid-lowering medications.  3 hypertension-patient's blood pressure is controlled.  Continue present medical regimen.  Kirk Ruths, MD

## 2020-09-06 DIAGNOSIS — N183 Chronic kidney disease, stage 3 unspecified: Secondary | ICD-10-CM | POA: Diagnosis not present

## 2020-09-06 DIAGNOSIS — I5032 Chronic diastolic (congestive) heart failure: Secondary | ICD-10-CM | POA: Diagnosis not present

## 2020-09-06 DIAGNOSIS — E1122 Type 2 diabetes mellitus with diabetic chronic kidney disease: Secondary | ICD-10-CM | POA: Diagnosis not present

## 2020-09-06 DIAGNOSIS — I130 Hypertensive heart and chronic kidney disease with heart failure and stage 1 through stage 4 chronic kidney disease, or unspecified chronic kidney disease: Secondary | ICD-10-CM | POA: Diagnosis not present

## 2020-09-15 ENCOUNTER — Ambulatory Visit: Payer: Medicare Other | Admitting: Cardiology

## 2020-09-28 NOTE — Progress Notes (Addendum)
Cardiology Office Note:    Date:  09/29/2020   ID:  Whitney, [name] 12-13-1945, MRN [id number]  PCP:  Whitney Labrum, MD  Cardiologist:  Whitney Ruths, MD   Referring MD: Whitney Labrum, MD   Chief Complaint  Patient presents with  . Follow-up    Chest pain    History of Present Illness:    [name] is a 75 y.o. female with a hx of CAD, HTN, DM, and HLD. She had CABG in 2005 and subsequent PCI of SVG-RCA. NSTEMI in 10/2019 led to heart cath that showed severe triple vessel CAD with 4/4 patent grafts. SVG-OM had three areas of stenosis, but good flow. CTA negative for PE. No other cause of chest pain was found. She was taken back to the cath lab for DES x 2 to proximal and mid SVG-OM. She was placed on DAPT. Echo at that time showed normal EF, grade 1 DD, mild LVH, mild AI. She has 3.1 cm dilation of proximal descending thoracic aorta. She was last seen by Dr. Stanford [name] 03/03/20 and was doing well at that time.   She presents today for follow up. She does report epigastric pain consistent with GERD and back pain consistent with her arthritis. She is concerned these pains are related to her heart disease. Epigastric pain is related to meals. But she has had chest pain all morning this morning unrelated to a meal. She has not taken nitro since her last PCI. She has taken hydrocodone which relieves the pain. CP lasts minutes to hours that feels like an aching pain. She is very concerned her symptoms are related to her heart.     Past Medical History:  Diagnosis Date  . Acute myocardial infarction, unspecified site, episode of care unspecified   . Coronary artery disease status post CABG 2005    a. s/p CABG 2005. b. 2011 - DES of SVG to PDA. c. Admitted with CP 03/2012 with stable anatomy by cath, felt noncardiac pain.  . Depression   . Diabetes mellitus   . Elevated LFTs   . Elevated transaminase level    Most recently October 2012 most recent  . Esophageal reflux   . GERD  (gastroesophageal reflux disease)   . Hernia   . History of cholecystectomy   . Hyperlipidemia    a. Not on statin due to elevated liver enzymes.  . Hypertension   . Kidney cysts    left    Past Surgical History:  Procedure Laterality Date  . ABDOMINAL HYSTERECTOMY    . BYPASS GRAFT  08/2003   Quadruple   . CHOLECYSTECTOMY    . CORONARY STENT INTERVENTION N/A 11/05/2019   Procedure: CORONARY STENT INTERVENTION;  Surgeon: Jettie Booze, MD;  Location: [facility];  Service: Cardiovascular;  Laterality: N/A;  . HERNIA REPAIR Right 01/2011  . INTRAVASCULAR ULTRASOUND/IVUS N/A 11/05/2019   Procedure: Intravascular Ultrasound/IVUS;  Surgeon: Jettie Booze, MD;  Location: [facility];  Service: Cardiovascular;  Laterality: N/A;  . LEFT HEART CATH AND CORS/GRAFTS ANGIOGRAPHY N/A 11/02/2019   Procedure: LEFT HEART CATH AND CORS/GRAFTS ANGIOGRAPHY;  Surgeon: Burnell Blanks, MD;  Location: [facility];  Service: Cardiovascular;  Laterality: N/A;  . LEFT HEART CATHETERIZATION WITH CORONARY/GRAFT ANGIOGRAM N/A 04/05/2012   Procedure: LEFT HEART CATHETERIZATION WITH Beatrix Fetters;  Surgeon: Sherren Mocha, MD;  Location: [facility];  Service: Cardiovascular;  Laterality: N/A;    Current Medications: Current Meds  Medication Sig  . amLODipine (NORVASC) 10 MG tablet Take 10 mg by mouth daily with lunch.  Marland Kitchen aspirin 81 MG chewable tablet Chew 1 tablet (81 mg total) by mouth daily.  Marland Kitchen atenolol (TENORMIN) 50 MG tablet Take 75 mg by mouth daily with lunch.  . Bisacodyl (LAXATIVE PO) Take 1 tablet by mouth 2 (two) times a week.  . cloNIDine (CATAPRES) 0.2 MG tablet Take 1 tablet (0.2 mg total) by mouth at bedtime. (Patient taking differently: Take 0.2 mg by mouth daily.)  . ezetimibe (ZETIA) 10 MG tablet Take 1 tablet (10 mg total) by mouth daily.  . furosemide (LASIX) 40 MG tablet Take 40 mg by mouth daily.  Marland Kitchen gemfibrozil (LOPID) 600 MG tablet Take  600 mg by mouth daily.   Marland Kitchen glipiZIDE (GLUCOTROL) 10 MG tablet Take 20 mg by mouth daily with lunch.   Marland Kitchen HYDROcodone-acetaminophen (NORCO/VICODIN) 5-325 MG per tablet Take 1 tablet by mouth daily as needed (pain).   . isosorbide mononitrate (IMDUR) 30 MG 24 hr tablet Take 1 tablet (30 mg total) by mouth daily.  Marland Kitchen linagliptin (TRADJENTA) 5 MG TABS tablet Take 5 mg by mouth daily with lunch.  . lisinopril (ZESTRIL) 10 MG tablet Take 10 mg by mouth daily.  Marland Kitchen MAGNESIUM PO Take 0.5 tablets by mouth daily with lunch.  . nitroGLYCERIN (NITROSTAT) 0.4 MG SL tablet Place 1 tablet (0.4 mg total) under the tongue every 5 (five) minutes as needed for chest pain.  Marland Kitchen omeprazole (PRILOSEC) 20 MG capsule Take 20 mg by mouth daily.  Marland Kitchen PARoxetine (PAXIL) 40 MG tablet Take 40 mg by mouth every morning.  . polyvinyl alcohol (LIQUIFILM TEARS) 1.4 % ophthalmic solution Place 1 drop into both eyes daily as needed for dry eyes.  . RYBELSUS 3 MG TABS Take 1 tablet by mouth every morning.  . ticagrelor (BRILINTA) 90 MG TABS tablet Take 1 tablet (90 mg total) by mouth 2 (two) times daily.  Marland Kitchen zolpidem (AMBIEN) 10 MG tablet Take 5 mg by mouth at bedtime.   . [DISCONTINUED] nitroGLYCERIN (NITROSTAT) 0.4 MG SL tablet Place 1 tablet (0.4 mg total) under the tongue every 5 (five) minutes as needed (up to 3 doses). For chest pain (Patient taking differently: Place 0.4 mg under the tongue every 5 (five) minutes as needed for chest pain (up to 3 doses).)     Allergies:   Aspirin   Social History   Socioeconomic History  . Marital status: Married    Spouse name: Glendell Docker  . Number of children: 2  . Years of education: Not on file  . Highest education level: Not on file  Occupational History  . Occupation: Part-Time hair-dresser  Tobacco Use  . Smoking status: Never Smoker  . Smokeless tobacco: Never Used  Substance and Sexual Activity  . Alcohol use: No  . Drug use: No  . Sexual activity: Not on file  Other Topics Concern   . Not on file  Social History Narrative  . Not on file   Social Determinants of Health   Financial Resource Strain: Not on file  Food Insecurity: Not on file  Transportation Needs: Not on file  Physical Activity: Not on file  Stress: Not on file  Social Connections: Not on file     Family History: The patient's family history includes Addison's disease in her sister; Cancer in her mother; Diabetes in her mother; Heart attack in her brother; Lung cancer in her daughter. There is no history of Colon cancer.  ROS:   Please see the history of present illness.     All other systems reviewed and are negative.  EKGs/Labs/Other Studies Reviewed:    The following studies were reviewed today:  Coronary stent intervention 11/05/19:  Only imaged SVG to OM.  Origin to Prox Graft lesion before 2nd Mrg is 70% stenosed. A drug-eluting stent was successfully placed using a SYNERGY XD 3.50X28, post dilated to 3.8 and optimized by IVUS.  Post intervention, there is a 0% residual stenosis.  Mid Graft lesion before 2nd Mrg is 90% stenosed. A drug-eluting stent was successfully placed using a SYNERGY XD 3.50X12, optimized with IVUS.  Post intervention, there is a 0% residual stenosis.  Dist Graft to Insertion lesion before 2nd Mrg is 50% stenosed.   Continue dual antiplatelet therapy and plan for discharge tomorrow.    EKG:  EKG is  ordered today.  The ekg ordered today demonstrates sinus rhythm HR 78, TWI lateral leads  Recent Labs: 11/06/2019: Hemoglobin 12.7; Platelets 249 11/07/2019: ALT 42; BUN 15; Creatinine, Ser 0.68; Potassium 3.7; Sodium 131  Recent Lipid Panel    Component Value Date/Time   CHOL 196 11/04/2019 0130   TRIG 392 (H) 11/04/2019 0130   HDL 35 (L) 11/04/2019 0130   CHOLHDL 5.6 11/04/2019 0130   VLDL 78 (H) 11/04/2019 0130   LDLCALC 83 11/04/2019 0130   LDLDIRECT 130.6 06/07/2011 0842    Physical Exam:    VS:  BP 132/70   Pulse 85   Ht 5\' 3"  (1.6 m)   Wt  127 lb 9.6 oz (57.9 kg)   SpO2 96%   BMI 22.60 kg/m     Wt Readings from Last 3 Encounters:  09/29/20 127 lb 9.6 oz (57.9 kg)  03/03/20 140 lb 3.2 oz (63.6 kg)  11/19/19 144 lb 12.8 oz (65.7 kg)     GEN: Well nourished, well developed in no acute distress HEENT: Normal NECK: No JVD; No carotid bruits LYMPHATICS: No lymphadenopathy CARDIAC: RRR, no murmurs, rubs, gallops RESPIRATORY:  Clear to auscultation without rales, wheezing or rhonchi  ABDOMEN: Soft, non-tender, non-distended MUSCULOSKELETAL:  No edema; No deformity  SKIN: Warm and dry NEUROLOGIC:  Alert and oriented x 3 PSYCHIATRIC:  Normal affect   ASSESSMENT:    1. Chest pain of uncertain etiology   2. Atherosclerosis of native coronary artery of native heart without angina pectoris   3. Hx of CABG   4. Essential hypertension   5. Pure hypercholesterolemia   6. Diabetes mellitus due to underlying condition with stage 1 chronic kidney disease, without long-term current use of insulin (HCC)   7. Chest pain, unspecified type    PLAN:    In order of problems listed above:  Chest pain - pain in epigastric area consistent with GERD - left chest pain worse with palpation - CP also radiates to her back, but she has arthritis - she has current ongoing chest pain and I have asked her to go to the ER - she declines - overall, I'm not convinced this is cardiac chest pain; however, given her disease and ongoing CP I obtained a hs troponin in clinic which resulted as negative - at this point, I think we should obtain a nuclear stress test - I think it would be best to obtain nuclear stress test   CAD s/p CABG x 4, DES x 2 to SVG-OM (11/05/19) - ASA and brilinta - per Dr. Stanford [name], will stop brilinta after 12 months and continue ASA  Hypertension - no medication changes, at goal - somewhat labile pressure on BP log   Hyperlipidemia with LDL goal < 70 - no statin - hx of increased LFTs on statin 11/04/2019:  Cholesterol 196; HDL 35; LDL Cholesterol 83; Triglycerides 392; VLDL 78 - LDL is not at goal - she declines other lipid lowering medications - she takes zetia and lopid - may need PCSK9i - will update fasting lipid profile when she comes for myoview    Medication Adjustments/Labs and Tests Ordered: Current medicines are reviewed at length with the patient today.  Concerns regarding medicines are outlined above.  Orders Placed This Encounter  Procedures  . Troponin T  . MYOCARDIAL PERFUSION IMAGING  . EKG 12-Lead   Meds ordered this encounter  Medications  . nitroGLYCERIN (NITROSTAT) 0.4 MG SL tablet    Sig: Place 1 tablet (0.4 mg total) under the tongue every 5 (five) minutes as needed for chest pain.    Dispense:  25 tablet    Refill:  3    Signed, Ledora Bottcher, [place]  09/29/2020 5:06 PM    [facility]

## 2020-09-29 ENCOUNTER — Telehealth: Payer: Self-pay

## 2020-09-29 ENCOUNTER — Encounter: Payer: Self-pay | Admitting: Physician Assistant

## 2020-09-29 ENCOUNTER — Ambulatory Visit (INDEPENDENT_AMBULATORY_CARE_PROVIDER_SITE_OTHER): Payer: Medicare Other | Admitting: Physician Assistant

## 2020-09-29 ENCOUNTER — Other Ambulatory Visit: Payer: Self-pay

## 2020-09-29 VITALS — BP 132/70 | HR 85 | Ht 63.0 in | Wt 127.6 lb

## 2020-09-29 DIAGNOSIS — N181 Chronic kidney disease, stage 1: Secondary | ICD-10-CM | POA: Diagnosis not present

## 2020-09-29 DIAGNOSIS — I10 Essential (primary) hypertension: Secondary | ICD-10-CM

## 2020-09-29 DIAGNOSIS — Z951 Presence of aortocoronary bypass graft: Secondary | ICD-10-CM

## 2020-09-29 DIAGNOSIS — I2510 Atherosclerotic heart disease of native coronary artery without angina pectoris: Secondary | ICD-10-CM

## 2020-09-29 DIAGNOSIS — R079 Chest pain, unspecified: Secondary | ICD-10-CM | POA: Diagnosis not present

## 2020-09-29 DIAGNOSIS — E7800 Pure hypercholesterolemia, unspecified: Secondary | ICD-10-CM | POA: Diagnosis not present

## 2020-09-29 DIAGNOSIS — E0822 Diabetes mellitus due to underlying condition with diabetic chronic kidney disease: Secondary | ICD-10-CM

## 2020-09-29 LAB — TROPONIN T: Troponin T (Highly Sensitive): 12 ng/L (ref 0–14)

## 2020-09-29 MED ORDER — NITROGLYCERIN 0.4 MG SL SUBL: 0.4000 mg | SUBLINGUAL_TABLET | SUBLINGUAL | 3 refills | Status: AC | PRN

## 2020-09-29 NOTE — Telephone Encounter (Signed)
Spoke with patient to advise blood test Normal.

## 2020-09-29 NOTE — Telephone Encounter (Signed)
-----   Message from Ledora Bottcher, [place] sent at 09/29/2020  4:04 PM EST ----- Your troponin resulted as normal, suggesting your chest pain is likely not cardiac in nature. I think we should continue with plans for a nuclear stress test.

## 2020-09-29 NOTE — Patient Instructions (Addendum)
Medication Instructions:  No Changes *If you need a refill on your cardiac medications before your next appointment, please call your pharmacy*   Lab Work: Troponin  If you have labs (blood work) drawn today and your tests are completely normal, you will receive your results only by: Marland Kitchen MyChart Message (if you have MyChart) OR . A paper copy in the mail If you have any lab test that is abnormal or we need to change your treatment, we will call you to review the results.   Testing/Procedures: Grant, Strang has requested that you have a lexiscan myoview. For further information please visit [email or web address]. Please follow instruction sheet, as given.    Follow-Up: At [facility], you and your health needs are our priority.  As part of our continuing mission to provide you with exceptional heart care, we have created designated Provider Care Teams.  These Care Teams include your primary Cardiologist (physician) and Advanced Practice Providers (APPs -  Physician Assistants and Nurse Practitioners) who all work together to provide you with the care you need, when you need it.  We recommend signing up for the patient portal called "MyChart".  Sign up information is provided on this After Visit Summary.  MyChart is used to connect with patients for Virtual Visits (Telemedicine).  Patients are able to view lab/test results, encounter notes, upcoming appointments, etc.  Non-urgent messages can be sent to your provider as well.   To learn more about what you can do with MyChart, go to [email or web address].    Your next appointment:   March 28,2022 11:45 AM  The format for your next appointment:   In Person  Provider:   Fabian Sharp PA-C

## 2020-09-30 ENCOUNTER — Other Ambulatory Visit: Payer: Self-pay

## 2020-09-30 ENCOUNTER — Telehealth: Payer: Self-pay

## 2020-09-30 MED ORDER — TICAGRELOR 90 MG PO TABS: 90.0000 mg | ORAL_TABLET | Freq: Two times a day (BID) | ORAL | 2 refills | Status: DC

## 2020-09-30 NOTE — Telephone Encounter (Signed)
Spoke with patient . Per A. Duke's instructions. Stop Brilinta on 11/05/2020. And continue ASA.

## 2020-10-01 NOTE — Addendum Note (Signed)
Addended by: Merri Ray A on: 10/01/2020 03:37 PM   Modules accepted: Orders

## 2020-10-01 NOTE — Addendum Note (Signed)
Addended by: Minette Brine on: 10/01/2020 05:20 PM   Modules accepted: Orders

## 2020-10-06 DIAGNOSIS — I5032 Chronic diastolic (congestive) heart failure: Secondary | ICD-10-CM | POA: Diagnosis not present

## 2020-10-06 DIAGNOSIS — I130 Hypertensive heart and chronic kidney disease with heart failure and stage 1 through stage 4 chronic kidney disease, or unspecified chronic kidney disease: Secondary | ICD-10-CM | POA: Diagnosis not present

## 2020-10-06 DIAGNOSIS — N183 Chronic kidney disease, stage 3 unspecified: Secondary | ICD-10-CM | POA: Diagnosis not present

## 2020-10-06 DIAGNOSIS — E1122 Type 2 diabetes mellitus with diabetic chronic kidney disease: Secondary | ICD-10-CM | POA: Diagnosis not present

## 2020-10-09 ENCOUNTER — Telehealth (HOSPITAL_COMMUNITY): Payer: Self-pay | Admitting: *Deleted

## 2020-10-09 NOTE — Telephone Encounter (Signed)
Close encounter 

## 2020-10-10 ENCOUNTER — Ambulatory Visit (HOSPITAL_COMMUNITY)
Admission: RE | Admit: 2020-10-10 | Payer: Medicare Other | Source: Ambulatory Visit | Attending: Physician Assistant | Admitting: Physician Assistant

## 2020-10-15 ENCOUNTER — Encounter (HOSPITAL_COMMUNITY): Payer: Self-pay | Admitting: Emergency Medicine

## 2020-10-15 ENCOUNTER — Other Ambulatory Visit: Payer: Self-pay

## 2020-10-15 ENCOUNTER — Inpatient Hospital Stay (HOSPITAL_COMMUNITY)
Admission: EM | Admit: 2020-10-15 | Discharge: 2020-10-18 | DRG: 343 | Disposition: A | Discharge status: Home / Self Care | Payer: Medicare Other | Attending: General Surgery | Admitting: General Surgery

## 2020-10-15 ENCOUNTER — Emergency Department (HOSPITAL_COMMUNITY): Payer: Medicare Other

## 2020-10-15 DIAGNOSIS — K3580 Unspecified acute appendicitis: Principal | ICD-10-CM | POA: Diagnosis present

## 2020-10-15 DIAGNOSIS — K760 Fatty (change of) liver, not elsewhere classified: Secondary | ICD-10-CM | POA: Diagnosis present

## 2020-10-15 DIAGNOSIS — K219 Gastro-esophageal reflux disease without esophagitis: Secondary | ICD-10-CM | POA: Diagnosis not present

## 2020-10-15 DIAGNOSIS — E785 Hyperlipidemia, unspecified: Secondary | ICD-10-CM | POA: Diagnosis not present

## 2020-10-15 DIAGNOSIS — Z7982 Long term (current) use of aspirin: Secondary | ICD-10-CM

## 2020-10-15 DIAGNOSIS — Z801 Family history of malignant neoplasm of trachea, bronchus and lung: Secondary | ICD-10-CM

## 2020-10-15 DIAGNOSIS — Z20822 Contact with and (suspected) exposure to covid-19: Secondary | ICD-10-CM | POA: Diagnosis present

## 2020-10-15 DIAGNOSIS — Z90710 Acquired absence of both cervix and uterus: Secondary | ICD-10-CM | POA: Diagnosis not present

## 2020-10-15 DIAGNOSIS — Z9049 Acquired absence of other specified parts of digestive tract: Secondary | ICD-10-CM | POA: Diagnosis not present

## 2020-10-15 DIAGNOSIS — R7989 Other specified abnormal findings of blood chemistry: Secondary | ICD-10-CM | POA: Diagnosis present

## 2020-10-15 DIAGNOSIS — I110 Hypertensive heart disease with heart failure: Secondary | ICD-10-CM | POA: Diagnosis not present

## 2020-10-15 DIAGNOSIS — Z8249 Family history of ischemic heart disease and other diseases of the circulatory system: Secondary | ICD-10-CM

## 2020-10-15 DIAGNOSIS — E119 Type 2 diabetes mellitus without complications: Secondary | ICD-10-CM | POA: Diagnosis present

## 2020-10-15 DIAGNOSIS — E1165 Type 2 diabetes mellitus with hyperglycemia: Secondary | ICD-10-CM | POA: Diagnosis not present

## 2020-10-15 DIAGNOSIS — K353 Acute appendicitis with localized peritonitis, without perforation or gangrene: Secondary | ICD-10-CM

## 2020-10-15 DIAGNOSIS — I10 Essential (primary) hypertension: Secondary | ICD-10-CM | POA: Diagnosis not present

## 2020-10-15 DIAGNOSIS — Z7984 Long term (current) use of oral hypoglycemic drugs: Secondary | ICD-10-CM

## 2020-10-15 DIAGNOSIS — Z951 Presence of aortocoronary bypass graft: Secondary | ICD-10-CM

## 2020-10-15 DIAGNOSIS — I252 Old myocardial infarction: Secondary | ICD-10-CM | POA: Diagnosis not present

## 2020-10-15 DIAGNOSIS — Z955 Presence of coronary angioplasty implant and graft: Secondary | ICD-10-CM | POA: Diagnosis not present

## 2020-10-15 DIAGNOSIS — Z833 Family history of diabetes mellitus: Secondary | ICD-10-CM | POA: Diagnosis not present

## 2020-10-15 DIAGNOSIS — Z886 Allergy status to analgesic agent status: Secondary | ICD-10-CM | POA: Diagnosis not present

## 2020-10-15 DIAGNOSIS — I25810 Atherosclerosis of coronary artery bypass graft(s) without angina pectoris: Secondary | ICD-10-CM | POA: Diagnosis not present

## 2020-10-15 DIAGNOSIS — I2510 Atherosclerotic heart disease of native coronary artery without angina pectoris: Secondary | ICD-10-CM | POA: Diagnosis not present

## 2020-10-15 DIAGNOSIS — K449 Diaphragmatic hernia without obstruction or gangrene: Secondary | ICD-10-CM | POA: Diagnosis present

## 2020-10-15 DIAGNOSIS — R1031 Right lower quadrant pain: Secondary | ICD-10-CM | POA: Diagnosis not present

## 2020-10-15 DIAGNOSIS — Z01810 Encounter for preprocedural cardiovascular examination: Secondary | ICD-10-CM | POA: Diagnosis not present

## 2020-10-15 DIAGNOSIS — I509 Heart failure, unspecified: Secondary | ICD-10-CM | POA: Diagnosis not present

## 2020-10-15 DIAGNOSIS — Z79899 Other long term (current) drug therapy: Secondary | ICD-10-CM | POA: Diagnosis not present

## 2020-10-15 DIAGNOSIS — I214 Non-ST elevation (NSTEMI) myocardial infarction: Secondary | ICD-10-CM | POA: Diagnosis not present

## 2020-10-15 LAB — URINALYSIS, ROUTINE W REFLEX MICROSCOPIC
Bilirubin Urine: NEGATIVE
Glucose, UA: NEGATIVE mg/dL
Hgb urine dipstick: NEGATIVE
Ketones, ur: 5 mg/dL — AB
Leukocytes,Ua: NEGATIVE
Nitrite: NEGATIVE
Protein, ur: NEGATIVE mg/dL
Specific Gravity, Urine: 1.042 — ABNORMAL HIGH (ref 1.005–1.030)
pH: 7 (ref 5.0–8.0)

## 2020-10-15 LAB — COMPREHENSIVE METABOLIC PANEL
ALT: 17 U/L (ref 0–44)
AST: 17 U/L (ref 15–41)
Albumin: 4.1 g/dL (ref 3.5–5.0)
Alkaline Phosphatase: 73 U/L (ref 38–126)
Anion gap: 7 (ref 5–15)
BUN: 10 mg/dL (ref 8–23)
CO2: 30 mmol/L (ref 22–32)
Calcium: 9.6 mg/dL (ref 8.9–10.3)
Chloride: 104 mmol/L (ref 98–111)
Creatinine, Ser: 0.95 mg/dL (ref 0.44–1.00)
GFR, Estimated: >60 mL/min (ref >60)
Glucose, Bld: 124 mg/dL — ABNORMAL HIGH (ref 70–99)
Potassium: 3.3 mmol/L — ABNORMAL LOW (ref 3.5–5.1)
Sodium: 141 mmol/L (ref 135–145)
Total Bilirubin: 1.1 mg/dL (ref 0.3–1.2)
Total Protein: 6.9 g/dL (ref 6.5–8.1)

## 2020-10-15 LAB — CBC
HCT: 37.5 % (ref 36.0–46.0)
Hemoglobin: 13.1 g/dL (ref 12.0–15.0)
MCH: 31.5 pg (ref 26.0–34.0)
MCHC: 34.9 g/dL (ref 30.0–36.0)
MCV: 90.1 fL (ref 80.0–100.0)
Platelets: 318 10*3/uL (ref 150–400)
RBC: 4.16 MIL/uL (ref 3.87–5.11)
RDW: 13 % (ref 11.5–15.5)
WBC: 7.3 10*3/uL (ref 4.0–10.5)
nRBC: 0 % (ref 0.0–0.2)

## 2020-10-15 LAB — LIPASE, BLOOD: Lipase: 53 U/L — ABNORMAL HIGH (ref 11–51)

## 2020-10-15 LAB — RESP PANEL BY RT-PCR (FLU A&B, COVID) ARPGX2
Influenza A by PCR: NEGATIVE
Influenza B by PCR: NEGATIVE
SARS Coronavirus 2 by RT PCR: NEGATIVE

## 2020-10-15 MED ORDER — DIPHENHYDRAMINE HCL 50 MG/ML IJ SOLN
12.5000 mg | Freq: Four times a day (QID) | INTRAMUSCULAR | Status: DC | PRN
Start: 2020-10-15 — End: 2020-10-18

## 2020-10-15 MED ORDER — POTASSIUM CHLORIDE IN NACL 20-0.9 MEQ/L-% IV SOLN
INTRAVENOUS | Status: DC
  Filled 2020-10-15 (×5): qty 1000

## 2020-10-15 MED ORDER — FUROSEMIDE 40 MG PO TABS
40.0000 mg | ORAL_TABLET | Freq: Every day | ORAL | Status: DC
  Administered 2020-10-16 – 2020-10-18 (×3): 40 mg via ORAL
  Filled 2020-10-15: qty 2
  Filled 2020-10-15 (×3): qty 1

## 2020-10-15 MED ORDER — METRONIDAZOLE IN NACL 5-0.79 MG/ML-% IV SOLN
500.0000 mg | Freq: Once | INTRAVENOUS | Status: AC
  Administered 2020-10-15: 500 mg via INTRAVENOUS
  Filled 2020-10-15: qty 100

## 2020-10-15 MED ORDER — ACETAMINOPHEN 650 MG RE SUPP: 650.0000 mg | Freq: Four times a day (QID) | RECTAL | Status: DC | PRN

## 2020-10-15 MED ORDER — NITROGLYCERIN 0.4 MG SL SUBL: 0.4000 mg | SUBLINGUAL_TABLET | SUBLINGUAL | Status: DC | PRN

## 2020-10-15 MED ORDER — AMLODIPINE BESYLATE 10 MG PO TABS
10.0000 mg | ORAL_TABLET | Freq: Every day | ORAL | Status: DC
  Administered 2020-10-16 – 2020-10-18 (×3): 10 mg via ORAL
  Filled 2020-10-15 (×3): qty 1

## 2020-10-15 MED ORDER — ZOLPIDEM TARTRATE 5 MG PO TABS
5.0000 mg | ORAL_TABLET | Freq: Every day | ORAL | Status: DC
  Administered 2020-10-15 – 2020-10-17 (×3): 5 mg via ORAL
  Filled 2020-10-15 (×3): qty 1

## 2020-10-15 MED ORDER — ACETAMINOPHEN 325 MG PO TABS: 650.0000 mg | ORAL_TABLET | Freq: Four times a day (QID) | ORAL | Status: DC | PRN

## 2020-10-15 MED ORDER — ONDANSETRON 4 MG PO TBDP: 4.0000 mg | ORAL_TABLET | Freq: Four times a day (QID) | ORAL | Status: DC | PRN

## 2020-10-15 MED ORDER — LISINOPRIL 10 MG PO TABS
10.0000 mg | ORAL_TABLET | Freq: Every day | ORAL | Status: DC
  Administered 2020-10-16 – 2020-10-18 (×3): 10 mg via ORAL
  Filled 2020-10-15 (×3): qty 1

## 2020-10-15 MED ORDER — PIPERACILLIN-TAZOBACTAM 3.375 G IVPB
3.3750 g | Freq: Three times a day (TID) | INTRAVENOUS | Status: DC
  Administered 2020-10-15 – 2020-10-18 (×8): 3.375 g via INTRAVENOUS
  Filled 2020-10-15 (×8): qty 50

## 2020-10-15 MED ORDER — ATENOLOL 25 MG PO TABS
75.0000 mg | ORAL_TABLET | Freq: Every day | ORAL | Status: DC
  Administered 2020-10-16 – 2020-10-18 (×3): 75 mg via ORAL
  Filled 2020-10-15 (×3): qty 3

## 2020-10-15 MED ORDER — CLONIDINE HCL 0.2 MG PO TABS
0.2000 mg | ORAL_TABLET | Freq: Every day | ORAL | Status: DC
  Administered 2020-10-16 – 2020-10-17 (×2): 0.2 mg via ORAL
  Filled 2020-10-15 (×3): qty 1

## 2020-10-15 MED ORDER — METOPROLOL TARTRATE 5 MG/5ML IV SOLN: 5.0000 mg | Freq: Four times a day (QID) | INTRAVENOUS | Status: DC | PRN

## 2020-10-15 MED ORDER — SODIUM CHLORIDE 0.9 % IV SOLN
2.0000 g | Freq: Once | INTRAVENOUS | Status: AC
  Administered 2020-10-15: 2 g via INTRAVENOUS
  Filled 2020-10-15: qty 20

## 2020-10-15 MED ORDER — ISOSORBIDE MONONITRATE ER 30 MG PO TB24
30.0000 mg | ORAL_TABLET | Freq: Every day | ORAL | Status: DC
  Administered 2020-10-16 – 2020-10-18 (×3): 30 mg via ORAL
  Filled 2020-10-15 (×3): qty 1

## 2020-10-15 MED ORDER — FENTANYL CITRATE (PF) 100 MCG/2ML IJ SOLN: 12.5000 ug | INTRAMUSCULAR | Status: DC | PRN

## 2020-10-15 MED ORDER — ONDANSETRON HCL 4 MG/2ML IJ SOLN: 4.0000 mg | Freq: Four times a day (QID) | INTRAMUSCULAR | Status: DC | PRN

## 2020-10-15 MED ORDER — SODIUM CHLORIDE 0.9 % IV BOLUS
1000.0000 mL | Freq: Once | INTRAVENOUS | Status: AC
  Administered 2020-10-15: 1000 mL via INTRAVENOUS

## 2020-10-15 MED ORDER — HYDROCODONE-ACETAMINOPHEN 5-325 MG PO TABS
1.0000 | ORAL_TABLET | ORAL | Status: DC | PRN
  Administered 2020-10-16 – 2020-10-17 (×2): 1 via ORAL
  Administered 2020-10-17 – 2020-10-18 (×2): 2 via ORAL
  Filled 2020-10-15 (×2): qty 1
  Filled 2020-10-15 (×2): qty 2
  Filled 2020-10-15: qty 1

## 2020-10-15 MED ORDER — MELATONIN 3 MG PO TABS
3.0000 mg | ORAL_TABLET | Freq: Every evening | ORAL | Status: DC | PRN
  Filled 2020-10-15: qty 1

## 2020-10-15 MED ORDER — IOHEXOL 300 MG/ML  SOLN
100.0000 mL | Freq: Once | INTRAMUSCULAR | Status: AC | PRN
  Administered 2020-10-15: 100 mL via INTRAVENOUS

## 2020-10-15 MED ORDER — DIPHENHYDRAMINE HCL 12.5 MG/5ML PO ELIX: 12.5000 mg | ORAL_SOLUTION | Freq: Four times a day (QID) | ORAL | Status: DC | PRN

## 2020-10-15 MED ORDER — PAROXETINE HCL 20 MG PO TABS
40.0000 mg | ORAL_TABLET | Freq: Every morning | ORAL | Status: DC
  Administered 2020-10-16 – 2020-10-18 (×3): 40 mg via ORAL
  Filled 2020-10-15 (×3): qty 2

## 2020-10-15 NOTE — H&P (Signed)
[name] is an 75 y.o. female.   Chief Complaint: abdominal pain HPI:  Pt is a 75 yo F who presents to the ED with 4-5 days of abdominal pain.  It hasn't really been worsening, but hasn't gotten any better. It is localized to the RLQ.    She denies any loss of appetite, and hasn't had nausea or vomiting. She also denies fever/chills.  She hasn't had pain like this before.  Of note, she has CAD and around 1 year ago had to get a cath and 2 drug eluting stents.  She had some atypical chest pain and spoke to cardiology PA around 2 weeks ago.  A stress test was set up, but she "didn't feel well, so called and canceled it."  She hasn't had any chest pain recently.    Of note, she has been eating much more healthily and has lost 13 pounds which has also helped her DM.    She is on brilinta, which is supposed to stop at 1 year (march 21?).  Past Medical History:  Diagnosis Date  . Acute myocardial infarction, unspecified site, episode of care unspecified   . Coronary artery disease status post CABG 2005    a. s/p CABG 2005. b. 2011 - DES of SVG to PDA. c. Admitted with CP 03/2012 with stable anatomy by cath, felt noncardiac pain.  . Depression   . Diabetes mellitus   . Elevated LFTs   . Elevated transaminase level    Most recently October 2012 most recent  . Esophageal reflux   . GERD (gastroesophageal reflux disease)   . Hernia   . History of cholecystectomy   . Hyperlipidemia    a. Not on statin due to elevated liver enzymes.  . Hypertension   . Kidney cysts    left    Past Surgical History:  Procedure Laterality Date  . ABDOMINAL HYSTERECTOMY    . BYPASS GRAFT  08/2003   Quadruple   . CHOLECYSTECTOMY    . CORONARY STENT INTERVENTION N/A 11/05/2019   Procedure: CORONARY STENT INTERVENTION;  Surgeon: Jettie Booze, MD;  Location: [facility];  Service: Cardiovascular;  Laterality: N/A;  . HERNIA REPAIR Right 01/2011  . INTRAVASCULAR ULTRASOUND/IVUS N/A 11/05/2019    Procedure: Intravascular Ultrasound/IVUS;  Surgeon: Jettie Booze, MD;  Location: [facility];  Service: Cardiovascular;  Laterality: N/A;  . LEFT HEART CATH AND CORS/GRAFTS ANGIOGRAPHY N/A 11/02/2019   Procedure: LEFT HEART CATH AND CORS/GRAFTS ANGIOGRAPHY;  Surgeon: Burnell Blanks, MD;  Location: [facility];  Service: Cardiovascular;  Laterality: N/A;  . LEFT HEART CATHETERIZATION WITH CORONARY/GRAFT ANGIOGRAM N/A 04/05/2012   Procedure: LEFT HEART CATHETERIZATION WITH Beatrix Fetters;  Surgeon: Sherren Mocha, MD;  Location: [facility];  Service: Cardiovascular;  Laterality: N/A;    Family History  Problem Relation Age of Onset  . Cancer Mother        stomach  . Diabetes Mother   . Heart attack Brother   . Addison's disease Sister   . Lung cancer Daughter        smoker  . Colon cancer Neg Hx    Social History:  reports that she has never smoked. She has never used smokeless tobacco. She reports that she does not drink alcohol and does not use drugs.  Allergies:  Allergies  Allergen Reactions  . Aspirin Nausea Only   amLODipine (NORVASC) 10 MG tablet  aspirin 81 MG chewable tablet  atenolol (TENORMIN)  50 MG tablet  Bisacodyl (LAXATIVE PO)  cloNIDine (CATAPRES) 0.2 MG tablet  ezetimibe (ZETIA) 10 MG tablet  furosemide (LASIX) 40 MG tablet  gemfibrozil (LOPID) 600 MG tablet  glipiZIDE (GLUCOTROL) 10 MG tablet  HYDROcodone-acetaminophen (NORCO/VICODIN) 5-325 MG per tablet  isosorbide mononitrate (IMDUR) 30 MG 24 hr tablet  linagliptin (TRADJENTA) 5 MG TABS tablet  lisinopril (ZESTRIL) 10 MG tablet  MAGNESIUM PO  nitroGLYCERIN (NITROSTAT) 0.4 MG SL tablet  omeprazole (PRILOSEC) 20 MG capsule  PARoxetine (PAXIL) 40 MG tablet  polyvinyl alcohol (LIQUIFILM TEARS) 1.4 % ophthalmic solution  RYBELSUS 3 MG TABS  ticagrelor (BRILINTA) 90 MG TABS tablet  zolpidem (AMBIEN) 10 MG tablet   Results for orders placed or  performed during the hospital encounter of 10/15/20 (from the past 48 hour(s))  Lipase, blood     Status: Abnormal   Collection Time: 10/15/20  2:49 PM  Result Value Ref Range   Lipase 53 (H) 11 - 51 U/L    Comment: Performed at [facility], [place] [address] [place], [place] [address]  Comprehensive metabolic panel     Status: Abnormal   Collection Time: 10/15/20  2:49 PM  Result Value Ref Range   Sodium 141 135 - 145 mmol/L   Potassium 3.3 (L) 3.5 - 5.1 mmol/L   Chloride 104 98 - 111 mmol/L   CO2 30 22 - 32 mmol/L   Glucose, Bld 124 (H) 70 - 99 mg/dL    Comment: Glucose reference range applies only to samples taken after fasting for at least 8 hours.   BUN 10 8 - 23 mg/dL   Creatinine, Ser 0.95 0.44 - 1.00 mg/dL   Calcium 9.6 8.9 - 10.3 mg/dL   Total Protein 6.9 6.5 - 8.1 g/dL   Albumin 4.1 3.5 - 5.0 g/dL   AST 17 15 - 41 U/L   ALT 17 0 - 44 U/L   Alkaline Phosphatase 73 38 - 126 U/L   Total Bilirubin 1.1 0.3 - 1.2 mg/dL   GFR, Estimated >60 >60 mL/min    Comment: (NOTE) Calculated using the CKD-EPI Creatinine Equation (2021)    Anion gap 7 5 - 15    Comment: Performed at [facility] [address], [place], [place] [address]  CBC     Status: None   Collection Time: 10/15/20  2:49 PM  Result Value Ref Range   WBC 7.3 4.0 - 10.5 K/uL   RBC 4.16 3.87 - 5.11 MIL/uL   Hemoglobin 13.1 12.0 - 15.0 g/dL   HCT 37.5 36.0 - 46.0 %   MCV 90.1 80.0 - 100.0 fL   MCH 31.5 26.0 - 34.0 pg   MCHC 34.9 30.0 - 36.0 g/dL   RDW 13.0 11.5 - 15.5 %   Platelets 318 150 - 400 K/uL   nRBC 0.0 0.0 - 0.2 %    Comment: Performed at [facility], [place] [address] [place], [place] [address]   CT Abdomen Pelvis W Contrast  Result Date: 10/15/2020 CLINICAL DATA:  Right lower quadrant pain. EXAM: CT ABDOMEN AND PELVIS WITH CONTRAST TECHNIQUE: Multidetector CT imaging of the abdomen and pelvis was performed using the standard protocol following bolus administration of intravenous  contrast. CONTRAST:  158mL OMNIPAQUE IOHEXOL 300 MG/ML  SOLN COMPARISON:  CT 01/31/2018 FINDINGS: Lower chest: Similar mild peripheral subpleural reticulation in the lung bases. No acute airspace disease or pleural effusion. Coronary artery calcifications. Hepatobiliary: Mild decreased hepatic density consistent with steatosis. No focal hepatic  lesion. Suggestion of subtle capsular nodularity. Clips in the gallbladder fossa postcholecystectomy. No biliary dilatation. Pancreas: No ductal dilatation or inflammation. Spleen: Normal in size without focal abnormality. Stable cleft in the medial spleen. Adrenals/Urinary Tract: Normal adrenal glands. No hydronephrosis or perinephric edema. Homogeneous renal enhancement with symmetric excretion on delayed phase imaging. Small cyst in the lower left kidney, cortical hypodensity in the upper right kidney is too small to characterize. No evidence of solid lesion. No visualized renal stone. Urinary bladder is physiologically distended without wall thickening. Stomach/Bowel: Small hiatal hernia. Stomach otherwise unremarkable. There is no small bowel obstruction, inflammation, or evident wall thickening. Acute appendicitis as described below. Colonic tortuosity with moderate colonic stool burden. Appendix: Location: Anterior to the cecum. Diameter: 10 mm Appendicolith: Possibly at the base, series 3, image 60. Mucosal hyper-enhancement: Yes Extraluminal gas: No Periappendiceal collection: No. Periappendiceal fat stranding but no extraluminal collection or free air. Vascular/Lymphatic: Advanced aortic atherosclerosis. No aneurysm. Patent portal vein. The splenic vein is tortuous. There is a 6 mm splenic artery aneurysm, unchanged. No acute vascular findings. No abdominopelvic adenopathy. Reproductive: Hysterectomy.  No adnexal mass. Other: Fat stranding in the right pericolic gutter and right lower quadrant related to appendiceal inflammation. No free air or focal fluid  collection. No ascites. Postsurgical change of the upper anterior abdominal wall. Musculoskeletal: There are no acute or suspicious osseous abnormalities. IMPRESSION: 1. Uncomplicated acute appendicitis. 2. Hepatic steatosis. Suggestion of subtle capsular nodularity, raising concern for cirrhosis. Recommend correlation with cirrhosis risk factors. 3. Small hiatal hernia. 4. Unchanged 6 mm splenic artery aneurysm. Aortic Atherosclerosis (ICD10-I70.0). Electronically Signed   By: Keith Rake M.D.   On: 10/15/2020 18:08    Review of Systems  HENT: Negative.   Eyes: Negative.   Respiratory: Negative.   Cardiovascular: Positive for chest pain (atypical).  Gastrointestinal: Positive for abdominal distention (mild) and abdominal pain (RLQ). Negative for anal bleeding, blood in stool, constipation, diarrhea and nausea.  Endocrine: Negative.   Genitourinary: Negative.   Musculoskeletal: Negative.   Skin: Negative.   Allergic/Immunologic: Negative.   Neurological: Negative.   Hematological: Negative.   Psychiatric/Behavioral: Negative.   All other systems reviewed and are negative.   Blood pressure 130/70, pulse 70, temperature 98.6 F (37 C), resp. rate 19, SpO2 100 %. Physical Exam Constitutional:      General: She is not in acute distress.    Appearance: She is well-developed and normal weight. She is not ill-appearing, toxic-appearing or diaphoretic.  HENT:     Head: Normocephalic and atraumatic.  Eyes:     Extraocular Movements: Extraocular movements intact.     Pupils: Pupils are equal, round, and reactive to light.  Cardiovascular:     Rate and Rhythm: Normal rate and regular rhythm.     Heart sounds: Normal heart sounds.  Pulmonary:     Effort: Pulmonary effort is normal.     Breath sounds: Normal breath sounds. No stridor.  Abdominal:     General: Abdomen is flat. Bowel sounds are decreased. There is distension (mild). There is no abdominal bruit. There are no signs of  injury.     Palpations: Abdomen is soft. There is no shifting dullness, fluid wave, hepatomegaly, splenomegaly or mass.     Tenderness: There is abdominal tenderness in the right lower quadrant. There is guarding (voluntary guarding RLQ). There is no rebound. Positive signs include McBurney's sign. Negative signs include Murphy's sign, Rovsing's sign and psoas sign.     Hernia: No hernia is present.  Skin:    General: Skin is warm and dry.     Capillary Refill: Capillary refill takes 2 to 3 seconds.     Coloration: Skin is not cyanotic or jaundiced.  Neurological:     General: No focal deficit present.     Mental Status: She is alert and oriented to person, place, and time.     Cranial Nerves: No cranial nerve deficit.     Motor: No weakness.  Psychiatric:        Mood and Affect: Mood normal. Mood is not anxious or depressed.        Behavior: Behavior normal.      Assessment/Plan Acute appendicitis Coronary artery disease with drug eluting stents on brilinta and recent stress test scheduled but canceled.   DM  Will contact cardiology regarding risk stratification and whether this stress test needs to be done pre op.  If she is deemed high risk , would attempt to treat non operatively.    Pt has quite a bit of tenderness, but is not in acute distress.    For now, will hold brilinta, place on IV antibiotics Clears now and NPO after midnight.    Pt's scan and exam are much better than I would expect for 4-5 days of pain from acute appendicitis.    Stark Klein, MD 10/15/2020, 8:02 PM

## 2020-10-15 NOTE — ED Triage Notes (Signed)
Patient complains of right lower abdominal pain that started Saturday. Denies changes in bowel movements, denies nausea. Patient alert, oriented, ambulatory, and in no apparent distress at this time.

## 2020-10-15 NOTE — ED Provider Notes (Signed)
[facility] EMERGENCY DEPARTMENT Provider Note   CSN: [id number] Arrival date & time: 10/15/20  1343     History Chief Complaint  Patient presents with  . Abdominal Pain    [name] is a 75 y.o. female with past medical history of hypertension, hyperlipidemia, GERD, SP PCI in 2005,2013,2021 presents the emerge department today for right lower abdominal pain that started Saturday.  Patient states that abdominal pain has stayed in this right lower quadrant, is feeling like a dull sensation and has been persistent since Saturday.  Patient states that it started when she was doing the dishes on Saturday night, has persisted up until now.  Patient states that the pain is a 5 out of 10, does not radiate anywhere.  Denies any pelvic pain or vaginal bleeding or discharge.Patient has history of cholecystectomy, hysterectomy, hernia repair no other abdominal surgeries.. Denies any fevers or chills.  Denies any nausea or vomiting.  Denies any diarrhea or bloody stools, states that she does have history of constipation did have a bowel movement this morning.  Denies any back pain chest pain or shortness of breath.  No other complaints at this time.  States that she was in her normal health before this.  No dysuria or hematuria. On Brilanta.   HPI     Past Medical History:  Diagnosis Date  . Acute myocardial infarction, unspecified site, episode of care unspecified   . Coronary artery disease status post CABG 2005    a. s/p CABG 2005. b. 2011 - DES of SVG to PDA. c. Admitted with CP 03/2012 with stable anatomy by cath, felt noncardiac pain.  . Depression   . Diabetes mellitus   . Elevated LFTs   . Elevated transaminase level    Most recently October 2012 most recent  . Esophageal reflux   . GERD (gastroesophageal reflux disease)   . Hernia   . History of cholecystectomy   . Hyperlipidemia    a. Not on statin due to elevated liver enzymes.  . Hypertension   . Kidney cysts     left    Patient Active Problem List   Diagnosis Date Noted  . Hepatotoxicity due to statin drug 03/21/2020  . NSTEMI (non-ST elevated myocardial infarction) ([place]) 11/02/2019  . Non-ST elevation (NSTEMI) myocardial infarction ([place])   . Uncontrolled type 2 diabetes mellitus with hyperglycemia ([place]) 05/22/2017  . Intractable vomiting with nausea 05/21/2017  . Diabetes mellitus without complication ([place]) 78/46/9629  . C. difficile colitis 05/20/2017  . Hypokalemia 05/20/2017  . Nausea alone 09/20/2012  . Nonspecific abnormal results of liver function study 09/20/2012  . Elevated transaminase level   . Dyspepsia and other specified disorders of function of stomach 05/18/2011  . FATTY LIVER DISEASE 01/09/2010  . Hyperlipidemia 01/25/2009  . Depression 01/25/2009  . Essential hypertension 01/25/2009  . Coronary atherosclerosis 01/25/2009  . BACK PAIN 01/25/2009  . HLD (hyperlipidemia) 01/25/2009  . Chest pain 01/25/2009  . GERD 09/03/2008  . DYSPHAGIA 09/03/2008  . DIABETES MELLITUS, BORDERLINE 09/03/2008    Past Surgical History:  Procedure Laterality Date  . ABDOMINAL HYSTERECTOMY    . BYPASS GRAFT  08/2003   Quadruple   . CHOLECYSTECTOMY    . CORONARY STENT INTERVENTION N/A 11/05/2019   Procedure: CORONARY STENT INTERVENTION;  Surgeon: Jettie Booze, MD;  Location: [facility];  Service: Cardiovascular;  Laterality: N/A;  . HERNIA REPAIR Right 01/2011  . INTRAVASCULAR ULTRASOUND/IVUS N/A 11/05/2019   Procedure: Intravascular Ultrasound/IVUS;  Surgeon: Jettie Booze, MD;  Location: [facility];  Service: Cardiovascular;  Laterality: N/A;  . LEFT HEART CATH AND CORS/GRAFTS ANGIOGRAPHY N/A 11/02/2019   Procedure: LEFT HEART CATH AND CORS/GRAFTS ANGIOGRAPHY;  Surgeon: Burnell Blanks, MD;  Location: [facility];  Service: Cardiovascular;  Laterality: N/A;  . LEFT HEART CATHETERIZATION WITH CORONARY/GRAFT ANGIOGRAM N/A 04/05/2012    Procedure: LEFT HEART CATHETERIZATION WITH Beatrix Fetters;  Surgeon: Sherren Mocha, MD;  Location: [facility];  Service: Cardiovascular;  Laterality: N/A;     OB History   No obstetric history on file.     Family History  Problem Relation Age of Onset  . Cancer Mother        stomach  . Diabetes Mother   . Heart attack Brother   . Addison's disease Sister   . Lung cancer Daughter        smoker  . Colon cancer Neg Hx     Social History   Tobacco Use  . Smoking status: Never Smoker  . Smokeless tobacco: Never Used  Substance Use Topics  . Alcohol use: No  . Drug use: No    Home Medications Prior to Admission medications   Medication Sig Start Date End Date Taking? Authorizing Provider  amLODipine (NORVASC) 10 MG tablet Take 10 mg by mouth daily with lunch. 10/06/19   [provider]  aspirin 81 MG chewable tablet Chew 1 tablet (81 mg total) by mouth daily. 11/07/19   Cheryln Manly, NP  atenolol (TENORMIN) 50 MG tablet Take 75 mg by mouth daily with lunch.    [provider]  Bisacodyl (LAXATIVE PO) Take 1 tablet by mouth 2 (two) times a week.    [provider]  cloNIDine (CATAPRES) 0.2 MG tablet Take 1 tablet (0.2 mg total) by mouth at bedtime. Patient taking differently: Take 0.2 mg by mouth daily. 10/04/12   Lelon Perla, MD  ezetimibe (ZETIA) 10 MG tablet Take 1 tablet (10 mg total) by mouth daily. 11/07/19   Cheryln Manly, NP  furosemide (LASIX) 40 MG tablet Take 40 mg by mouth daily.    [provider]  gemfibrozil (LOPID) 600 MG tablet Take 600 mg by mouth daily.  07/23/14   [provider]  glipiZIDE (GLUCOTROL) 10 MG tablet Take 20 mg by mouth daily with lunch.  07/01/14   [provider]  HYDROcodone-acetaminophen (NORCO/VICODIN) 5-325 MG per tablet Take 1 tablet by mouth daily as needed (pain).     [provider]  isosorbide mononitrate (IMDUR) 30 MG 24 hr tablet Take 1 tablet  (30 mg total) by mouth daily. 11/07/19   Cheryln Manly, NP  linagliptin (TRADJENTA) 5 MG TABS tablet Take 5 mg by mouth daily with lunch.    [provider]  lisinopril (ZESTRIL) 10 MG tablet Take 10 mg by mouth daily. 10/29/19   [provider]  MAGNESIUM PO Take 0.5 tablets by mouth daily with lunch.    [provider]  nitroGLYCERIN (NITROSTAT) 0.4 MG SL tablet Place 1 tablet (0.4 mg total) under the tongue every 5 (five) minutes as needed for chest pain. 09/29/20 12/28/20  Ledora Bottcher, PA  omeprazole (PRILOSEC) 20 MG capsule Take 20 mg by mouth daily. 10/19/19   [provider]  PARoxetine (PAXIL) 40 MG tablet Take 40 mg by mouth every morning. 07/01/14   [provider]  polyvinyl alcohol (LIQUIFILM TEARS) 1.4 % ophthalmic solution Place 1 drop into  both eyes daily as needed for dry eyes.    [provider]  RYBELSUS 3 MG TABS Take 1 tablet by mouth every morning. 02/29/20   [provider]  ticagrelor (BRILINTA) 90 MG TABS tablet Take 1 tablet (90 mg total) by mouth 2 (two) times daily. 09/30/20   [facility], Tami Lin, PA  zolpidem (AMBIEN) 10 MG tablet Take 5 mg by mouth at bedtime.  07/11/14   [provider]    Allergies    Aspirin  Review of Systems   Review of Systems  Constitutional: Negative for chills, diaphoresis, fatigue and fever.  HENT: Negative for congestion, sore throat and trouble swallowing.   Eyes: Negative for pain and visual disturbance.  Respiratory: Negative for cough, shortness of breath and wheezing.   Cardiovascular: Negative for chest pain, palpitations and leg swelling.  Gastrointestinal: Positive for abdominal pain. Negative for abdominal distention, diarrhea, nausea and vomiting.  Genitourinary: Negative for difficulty urinating.  Musculoskeletal: Negative for back pain, neck pain and neck stiffness.  Skin: Negative for pallor.  Neurological: Negative for dizziness, speech  difficulty, weakness and headaches.  Psychiatric/Behavioral: Negative for confusion.    Physical Exam Updated Vital Signs BP (!) 112/59   Pulse 74   Temp 98.6 F (37 C)   Resp 19   SpO2 99%   Physical Exam Constitutional:      General: She is not in acute distress.    Appearance: Normal appearance. She is not ill-appearing, toxic-appearing or diaphoretic.  HENT:     Mouth/Throat:     Mouth: Mucous membranes are moist.     Pharynx: Oropharynx is clear.  Eyes:     General: No scleral icterus.    Extraocular Movements: Extraocular movements intact.     Pupils: Pupils are equal, round, and reactive to light.  Cardiovascular:     Rate and Rhythm: Normal rate and regular rhythm.     Pulses: Normal pulses.     Heart sounds: Normal heart sounds.  Pulmonary:     Effort: Pulmonary effort is normal. No respiratory distress.     Breath sounds: Normal breath sounds. No stridor. No wheezing, rhonchi or rales.  Chest:     Chest wall: No tenderness.  Abdominal:     General: Abdomen is flat. There is no distension.     Palpations: Abdomen is soft.     Tenderness: There is abdominal tenderness in the right lower quadrant and suprapubic area. There is guarding. There is no rebound. Positive signs include McBurney's sign. Negative signs include Murphy's sign and Rovsing's sign.       Comments: Patient with tenderness  in right lower quadrant and suprapubic area with guarding.  Musculoskeletal:        General: No swelling or tenderness. Normal range of motion.     Cervical back: Normal range of motion and neck supple. No rigidity.     Right lower leg: No edema.     Left lower leg: No edema.  Skin:    General: Skin is warm and dry.     Capillary Refill: Capillary refill takes less than 2 seconds.     Coloration: Skin is not pale.  Neurological:     General: No focal deficit present.     Mental Status: She is alert and oriented to person, place, and time.  Psychiatric:        Mood and  Affect: Mood normal.        Behavior: Behavior normal.  ED Results / Procedures / Treatments   Labs (all labs ordered are listed, but only abnormal results are displayed) Labs Reviewed  LIPASE, BLOOD - Abnormal; Notable for the following components:      Result Value   Lipase 53 (*)    All other components within normal limits  COMPREHENSIVE METABOLIC PANEL - Abnormal; Notable for the following components:   Potassium 3.3 (*)    Glucose, Bld 124 (*)    All other components within normal limits  RESP PANEL BY RT-PCR (FLU A&B, COVID) ARPGX2  CBC  URINALYSIS, ROUTINE W REFLEX MICROSCOPIC    EKG None  Radiology CT Abdomen Pelvis W Contrast  Result Date: 10/15/2020 CLINICAL DATA:  Right lower quadrant pain. EXAM: CT ABDOMEN AND PELVIS WITH CONTRAST TECHNIQUE: Multidetector CT imaging of the abdomen and pelvis was performed using the standard protocol following bolus administration of intravenous contrast. CONTRAST:  148mL OMNIPAQUE IOHEXOL 300 MG/ML  SOLN COMPARISON:  CT 01/31/2018 FINDINGS: Lower chest: Similar mild peripheral subpleural reticulation in the lung bases. No acute airspace disease or pleural effusion. Coronary artery calcifications. Hepatobiliary: Mild decreased hepatic density consistent with steatosis. No focal hepatic lesion. Suggestion of subtle capsular nodularity. Clips in the gallbladder fossa postcholecystectomy. No biliary dilatation. Pancreas: No ductal dilatation or inflammation. Spleen: Normal in size without focal abnormality. Stable cleft in the medial spleen. Adrenals/Urinary Tract: Normal adrenal glands. No hydronephrosis or perinephric edema. Homogeneous renal enhancement with symmetric excretion on delayed phase imaging. Small cyst in the lower left kidney, cortical hypodensity in the upper right kidney is too small to characterize. No evidence of solid lesion. No visualized renal stone. Urinary bladder is physiologically distended without wall thickening.  Stomach/Bowel: Small hiatal hernia. Stomach otherwise unremarkable. There is no small bowel obstruction, inflammation, or evident wall thickening. Acute appendicitis as described below. Colonic tortuosity with moderate colonic stool burden. Appendix: Location: Anterior to the cecum. Diameter: 10 mm Appendicolith: Possibly at the base, series 3, image 60. Mucosal hyper-enhancement: Yes Extraluminal gas: No Periappendiceal collection: No. Periappendiceal fat stranding but no extraluminal collection or free air. Vascular/Lymphatic: Advanced aortic atherosclerosis. No aneurysm. Patent portal vein. The splenic vein is tortuous. There is a 6 mm splenic artery aneurysm, unchanged. No acute vascular findings. No abdominopelvic adenopathy. Reproductive: Hysterectomy.  No adnexal mass. Other: Fat stranding in the right pericolic gutter and right lower quadrant related to appendiceal inflammation. No free air or focal fluid collection. No ascites. Postsurgical change of the upper anterior abdominal wall. Musculoskeletal: There are no acute or suspicious osseous abnormalities. IMPRESSION: 1. Uncomplicated acute appendicitis. 2. Hepatic steatosis. Suggestion of subtle capsular nodularity, raising concern for cirrhosis. Recommend correlation with cirrhosis risk factors. 3. Small hiatal hernia. 4. Unchanged 6 mm splenic artery aneurysm. Aortic Atherosclerosis (ICD10-I70.0). Electronically Signed   By: Keith Rake M.D.   On: 10/15/2020 18:08    Procedures Procedures   Medications Ordered in ED Medications  cefTRIAXone (ROCEPHIN) 2 g in sodium chloride 0.9 % 100 mL IVPB (2 g Intravenous New Bag/Given (Non-Interop) 10/15/20 1827)    And  metroNIDAZOLE (FLAGYL) IVPB 500 mg (500 mg Intravenous New Bag/Given (Non-Interop) 10/15/20 1825)  iohexol (OMNIPAQUE) 300 MG/ML solution 100 mL (100 mLs Intravenous Contrast Given 10/15/20 1743)  sodium chloride 0.9 % bolus 1,000 mL (1,000 mLs Intravenous New Bag/Given (Non-Interop)  10/15/20 1825)    ED Course  I have reviewed the triage vital signs and the nursing notes.  Pertinent labs & imaging results that were available during my care of the  patient were reviewed by me and considered in my medical decision making (see chart for details).    MDM Rules/Calculators/A&P                          [name] is a 75 y.o. female with past medical history of hypertension, hyperlipidemia, GERD, SP PCI in 2005,2013,2021 presents the emerge department today for right lower abdominal pain that started Saturday.  Patient with guarding in right lower quadrant, suspicious for appendicitis.  Will obtain basic work-up and CT scan at this time, patient states that she does not want anything for pain currently.  CT scan with acute uncomplicated appendicitis.  Upon reevaluation, patient still states that pain is 5 out of 10 does not want anything for pain.  Will speak to general surgery, patient last took her Brilinta at 3:30 PM.  Spoke to Dr. Barry Dienes, general surgery who will admit the patient. The patient appears reasonably stabilized for admission considering the current resources, flow, and capabilities available in the ED at this time, and I doubt any other [facility] requiring further screening and/or treatment in the ED prior to admission.  I discussed this case with my attending physician who cosigned this note including patient's presenting symptoms, physical exam, and planned diagnostics and interventions. Attending physician stated agreement with plan or made changes to plan which were implemented.   Attending physician assessed patient at bedside.   Final Clinical Impression(s) / ED Diagnoses Final diagnoses:  Acute appendicitis with localized peritonitis, without perforation, abscess, or gangrene    Rx / DC Orders ED Discharge Orders    None       Alfredia Client, PA-C 10/15/20 1851    Valarie Merino, MD 10/17/20 1022

## 2020-10-16 ENCOUNTER — Encounter (HOSPITAL_COMMUNITY): Payer: Self-pay

## 2020-10-16 DIAGNOSIS — Z01810 Encounter for preprocedural cardiovascular examination: Secondary | ICD-10-CM

## 2020-10-16 DIAGNOSIS — I25810 Atherosclerosis of coronary artery bypass graft(s) without angina pectoris: Secondary | ICD-10-CM

## 2020-10-16 LAB — MRSA PCR SCREENING: MRSA by PCR: NEGATIVE

## 2020-10-16 LAB — BASIC METABOLIC PANEL
Anion gap: 5 (ref 5–15)
BUN: 9 mg/dL (ref 8–23)
CO2: 28 mmol/L (ref 22–32)
Calcium: 9.2 mg/dL (ref 8.9–10.3)
Chloride: 108 mmol/L (ref 98–111)
Creatinine, Ser: 0.76 mg/dL (ref 0.44–1.00)
GFR, Estimated: >60 mL/min (ref >60)
Glucose, Bld: 122 mg/dL — ABNORMAL HIGH (ref 70–99)
Potassium: 3.4 mmol/L — ABNORMAL LOW (ref 3.5–5.1)
Sodium: 141 mmol/L (ref 135–145)

## 2020-10-16 LAB — GLUCOSE, CAPILLARY
Glucose-Capillary: 103 mg/dL — ABNORMAL HIGH (ref 70–99)
Glucose-Capillary: 118 mg/dL — ABNORMAL HIGH (ref 70–99)
Glucose-Capillary: 125 mg/dL — ABNORMAL HIGH (ref 70–99)
Glucose-Capillary: 134 mg/dL — ABNORMAL HIGH (ref 70–99)
Glucose-Capillary: 96 mg/dL (ref 70–99)

## 2020-10-16 LAB — CBC
HCT: 31.8 % — ABNORMAL LOW (ref 36.0–46.0)
Hemoglobin: 11.4 g/dL — ABNORMAL LOW (ref 12.0–15.0)
MCH: 31.8 pg (ref 26.0–34.0)
MCHC: 35.8 g/dL (ref 30.0–36.0)
MCV: 88.6 fL (ref 80.0–100.0)
Platelets: 216 10*3/uL (ref 150–400)
RBC: 3.59 MIL/uL — ABNORMAL LOW (ref 3.87–5.11)
RDW: 12.7 % (ref 11.5–15.5)
WBC: 5.1 10*3/uL (ref 4.0–10.5)
nRBC: 0 % (ref 0.0–0.2)

## 2020-10-16 LAB — HEMOGLOBIN A1C
Hgb A1c MFr Bld: 6 % — ABNORMAL HIGH (ref 4.8–5.6)
Mean Plasma Glucose: 125.5 mg/dL

## 2020-10-16 MED ORDER — INSULIN ASPART 100 UNIT/ML ~~LOC~~ SOLN
0.0000 [IU] | SUBCUTANEOUS | Status: DC
  Administered 2020-10-16: 2 [IU] via SUBCUTANEOUS
  Administered 2020-10-17: 5 [IU] via SUBCUTANEOUS
  Administered 2020-10-17: 2 [IU] via SUBCUTANEOUS
  Administered 2020-10-17: 5 [IU] via SUBCUTANEOUS
  Administered 2020-10-17 – 2020-10-18 (×2): 3 [IU] via SUBCUTANEOUS
  Administered 2020-10-18: 5 [IU] via SUBCUTANEOUS
  Administered 2020-10-18: 3 [IU] via SUBCUTANEOUS

## 2020-10-16 NOTE — Consult Note (Addendum)
Cardiology Consultation:   Patient ID: [name] MRN: [id number]; DOB: 1945/11/09  Admit date: 10/15/2020 Date of Consult: 10/16/2020  PCP:  Curlene Labrum, MD   [place]  Cardiologist:  Kirk Ruths, MD    Patient Profile:   [name] is a 75 y.o. female with a hx of CAD s/p CABG and subsequent PCI's, hypertension, hyperlipidemia, diabetes mellitus, dilation of descending aorta and GERD who is being seen today for the evaluation of preoperative clearance at the request of Barkley Boards, [place].   [name] is a 75 y.o. female with a hx of CAD, HTN, DM, and HLD. She had CABG in 2005 and subsequent PCI of SVG-RCA. NSTEMI in 10/2019 led to heart cath that showed severe triple vessel CAD with 4/4 patent grafts. SVG-OM had three areas of stenosis, but good flow. CTA negative for PE. No other cause of chest pain was found. She was taken back to the cath lab for DES x 2 to proximal and mid SVG-OM. She was placed on DAPT. Echo at that time showed normal EF, grade 1 DD, mild LVH, mild AI. She has 3.1 cm dilation of proximal descending thoracic aorta.  Patient was seen by Fabian Sharp February 21 for chest pain which felt atypical and consistent with GERD as it was located on epigastric area.  Troponin was negative.  Stress test was ordered however pain resolved after patient started taking omeprazole.  Patient called and canceled stress test.  History of Present Illness:   [name] presented with progressive worsening right lower quadrant abdominal pain since Saturday.  Denies nausea, vomiting, fever, chills or loss of appetite.  CT of abdominal showed uncomplicated acute appendicitis.  Possible concern for cirrhosis.  Patient was admitted by surgery and started on broad-spectrum antibiotic and IV fluids.  Plan to take her to the OR tomorrow.  Cardiology is asked for surgical clearance.  Brilinta on hold.  Patient denies symptoms similar to prior angina.   Patient does not think that her recent chest pain/epigastric episode was due to her heart.  However she was initially concerned.  K 3.3>>3.4 Scr normal HGb 11.4 HgbA1c 6.0     Past Medical History:  Diagnosis Date  . Acute myocardial infarction, unspecified site, episode of care unspecified   . Coronary artery disease status post CABG 2005    a. s/p CABG 2005. b. 2011 - DES of SVG to PDA. c. Admitted with CP 03/2012 with stable anatomy by cath, felt noncardiac pain.  . Depression   . Diabetes mellitus   . Elevated LFTs   . Elevated transaminase level    Most recently October 2012 most recent  . Esophageal reflux   . GERD (gastroesophageal reflux disease)   . Hernia   . History of cholecystectomy   . Hyperlipidemia    a. Not on statin due to elevated liver enzymes.  . Hypertension   . Kidney cysts    left    Past Surgical History:  Procedure Laterality Date  . ABDOMINAL HYSTERECTOMY    . BYPASS GRAFT  08/2003   Quadruple   . CHOLECYSTECTOMY    . CORONARY STENT INTERVENTION N/A 11/05/2019   Procedure: CORONARY STENT INTERVENTION;  Surgeon: Jettie Booze, MD;  Location: [facility];  Service: Cardiovascular;  Laterality: N/A;  . HERNIA REPAIR Right 01/2011  . INTRAVASCULAR ULTRASOUND/IVUS N/A 11/05/2019   Procedure: Intravascular Ultrasound/IVUS;  Surgeon: Jettie Booze, MD;  Location: [facility] INVASIVE CV  LAB;  Service: Cardiovascular;  Laterality: N/A;  . LEFT HEART CATH AND CORS/GRAFTS ANGIOGRAPHY N/A 11/02/2019   Procedure: LEFT HEART CATH AND CORS/GRAFTS ANGIOGRAPHY;  Surgeon: Burnell Blanks, MD;  Location: [facility];  Service: Cardiovascular;  Laterality: N/A;  . LEFT HEART CATHETERIZATION WITH CORONARY/GRAFT ANGIOGRAM N/A 04/05/2012   Procedure: LEFT HEART CATHETERIZATION WITH Beatrix Fetters;  Surgeon: Sherren Mocha, MD;  Location: [facility] CATH LAB;  Service: Cardiovascular;  Laterality: N/A;    Inpatient Medications: Scheduled  Meds: . amLODipine  10 mg Oral Q lunch  . atenolol  75 mg Oral Q lunch  . cloNIDine  0.2 mg Oral Daily  . furosemide  40 mg Oral Daily  . insulin aspart  0-15 Units Subcutaneous Q4H  . isosorbide mononitrate  30 mg Oral Daily  . lisinopril  10 mg Oral Daily  . PARoxetine  40 mg Oral q morning  . zolpidem  5 mg Oral QHS   Continuous Infusions: . 0.9 % NaCl with KCl 20 mEq / L 75 mL/hr at 10/16/20 0501  . piperacillin-tazobactam (ZOSYN)  IV 3.375 g (10/16/20 0529)   PRN Meds: acetaminophen **OR** acetaminophen, diphenhydrAMINE **OR** diphenhydrAMINE, fentaNYL (SUBLIMAZE) injection, HYDROcodone-acetaminophen, melatonin, metoprolol tartrate, nitroGLYCERIN, ondansetron **OR** ondansetron (ZOFRAN) IV  Allergies:    Allergies  Allergen Reactions  . Aspirin Nausea Only    Social History:   Social History   Socioeconomic History  . Marital status: Married    Spouse name: Glendell Docker  . Number of children: 2  . Years of education: Not on file  . Highest education level: Not on file  Occupational History  . Occupation: Part-Time hair-dresser  Tobacco Use  . Smoking status: Never Smoker  . Smokeless tobacco: Never Used  Vaping Use  . Vaping Use: Never used  Substance and Sexual Activity  . Alcohol use: No  . Drug use: No  . Sexual activity: Not on file  Other Topics Concern  . Not on file  Social History Narrative  . Not on file   Social Determinants of Health   Financial Resource Strain: Not on file  Food Insecurity: Not on file  Transportation Needs: Not on file  Physical Activity: Not on file  Stress: Not on file  Social Connections: Not on file  Intimate Partner Violence: Not on file    Family History:   Family History  Problem Relation Age of Onset  . Cancer Mother        stomach  . Diabetes Mother   . Heart attack Brother   . Addison's disease Sister   . Lung cancer Daughter        smoker  . Colon cancer Neg Hx      ROS:  Please see the history of present  illness.  All other ROS reviewed and negative.     Physical Exam/Data:   Vitals:   10/16/20 0221 10/16/20 0224 10/16/20 0447 10/16/20 1013  BP: 134/74  126/66 129/70  Pulse: 77  74 72  Resp: 18  16 16   Temp: 98.2 F (36.8 C)  98.6 F (37 C) 98.2 F (36.8 C)  TempSrc:   Oral Oral  SpO2: 100%  99% 99%  Weight:  59 kg    Height:  5\' 3"  (1.6 m)      Intake/Output Summary (Last 24 hours) at 10/16/2020 1441 Last data filed at 10/16/2020 1015 Gross per 24 hour  Intake 1686.5 ml  Output 900 ml  Net 786.5 ml   Last 3 Weights  10/16/2020 09/29/2020 03/03/2020  Weight (lbs) 130 lb 127 lb 9.6 oz 140 lb 3.2 oz  Weight (kg) 58.968 kg 57.879 kg 63.594 kg     Body mass index is 23.03 kg/m.  General:  Well nourished, well developed, in no acute distress HEENT: normal Lymph: no adenopathy Neck: no JVD Endocrine:  No thryomegaly Vascular: No carotid bruits; FA pulses 2+ bilaterally without bruits  Cardiac:  normal S1, S2; RRR; no murmur Lungs:  clear to auscultation bilaterally, no wheezing, rhonchi or rales  Abd: soft, TTP at RLQ, no hepatomegaly  Ext: no edema Musculoskeletal:  No deformities, BUE and BLE strength normal and equal Skin: warm and dry  Neuro:  CNs 2-12 intact, no focal abnormalities noted Psych:  Normal affect   EKG:  The EKG was personally reviewed and demonstrates:  Pending EKG today  Telemetry:  Telemetry was personally reviewed and demonstrates:  SR  Relevant CV Studies:  CORONARY STENT INTERVENTION  11/05/19  Intravascular Ultrasound/IVUS    Conclusion    Only imaged SVG to OM.  Origin to Prox Graft lesion before 2nd Mrg is 70% stenosed. A drug-eluting stent was successfully placed using a SYNERGY XD 3.50X28, post dilated to 3.8 and optimized by IVUS.  Post intervention, there is a 0% residual stenosis.  Mid Graft lesion before 2nd Mrg is 90% stenosed. A drug-eluting stent was successfully placed using a SYNERGY XD 3.50X12, optimized with IVUS.  Post  intervention, there is a 0% residual stenosis.  Dist Graft to Insertion lesion before 2nd Mrg is 50% stenosed.   Continue dual antiplatelet therapy and plan for discharge tomorrow.   Diagnostic Dominance: Right    Intervention     Echo 11/03/19 1. Left ventricular ejection fraction, by estimation, is 65 to 70%. The  left ventricle has normal function. The left ventricle has no regional  wall motion abnormalities. There is mild asymmetric left ventricular  hypertrophy. Left ventricular diastolic  parameters are consistent with Grade I diastolic dysfunction (impaired  relaxation).  2. Right ventricular systolic function is normal. The right ventricular  size is normal. There is normal pulmonary artery systolic pressure.  3. Left atrial size was mildly dilated.  4. The mitral valve is normal in structure. No evidence of mitral valve  regurgitation. No evidence of mitral stenosis.  5. The aortic valve is normal in structure. Aortic valve regurgitation is  mild. No aortic stenosis is present.   Laboratory Data:  High Sensitivity Troponin:  No results for input(s): TROPONINIHS in the last 720 hours.   Chemistry Recent Labs  Lab 10/15/20 1449 10/16/20 0407  NA 141 141  K 3.3* 3.4*  CL 104 108  CO2 30 28  GLUCOSE 124* 122*  BUN 10 9  CREATININE 0.95 0.76  CALCIUM 9.6 9.2  GFRNONAA >60 >60  ANIONGAP 7 5    Recent Labs  Lab 10/15/20 1449  PROT 6.9  ALBUMIN 4.1  AST 17  ALT 17  ALKPHOS 73  BILITOT 1.1   Hematology Recent Labs  Lab 10/15/20 1449 10/16/20 0407  WBC 7.3 5.1  RBC 4.16 3.59*  HGB 13.1 11.4*  HCT 37.5 31.8*  MCV 90.1 88.6  MCH 31.5 31.8  MCHC 34.9 35.8  RDW 13.0 12.7  PLT 318 216   Radiology/Studies:  CT Abdomen Pelvis W Contrast  Result Date: 10/15/2020 CLINICAL DATA:  Right lower quadrant pain. EXAM: CT ABDOMEN AND PELVIS WITH CONTRAST TECHNIQUE: Multidetector CT imaging of the abdomen and pelvis was performed using the standard  protocol following bolus administration of intravenous contrast. CONTRAST:  193mL OMNIPAQUE IOHEXOL 300 MG/ML  SOLN COMPARISON:  CT 01/31/2018 FINDINGS: Lower chest: Similar mild peripheral subpleural reticulation in the lung bases. No acute airspace disease or pleural effusion. Coronary artery calcifications. Hepatobiliary: Mild decreased hepatic density consistent with steatosis. No focal hepatic lesion. Suggestion of subtle capsular nodularity. Clips in the gallbladder fossa postcholecystectomy. No biliary dilatation. Pancreas: No ductal dilatation or inflammation. Spleen: Normal in size without focal abnormality. Stable cleft in the medial spleen. Adrenals/Urinary Tract: Normal adrenal glands. No hydronephrosis or perinephric edema. Homogeneous renal enhancement with symmetric excretion on delayed phase imaging. Small cyst in the lower left kidney, cortical hypodensity in the upper right kidney is too small to characterize. No evidence of solid lesion. No visualized renal stone. Urinary bladder is physiologically distended without wall thickening. Stomach/Bowel: Small hiatal hernia. Stomach otherwise unremarkable. There is no small bowel obstruction, inflammation, or evident wall thickening. Acute appendicitis as described below. Colonic tortuosity with moderate colonic stool burden. Appendix: Location: Anterior to the cecum. Diameter: 10 mm Appendicolith: Possibly at the base, series 3, image 60. Mucosal hyper-enhancement: Yes Extraluminal gas: No Periappendiceal collection: No. Periappendiceal fat stranding but no extraluminal collection or free air. Vascular/Lymphatic: Advanced aortic atherosclerosis. No aneurysm. Patent portal vein. The splenic vein is tortuous. There is a 6 mm splenic artery aneurysm, unchanged. No acute vascular findings. No abdominopelvic adenopathy. Reproductive: Hysterectomy.  No adnexal mass. Other: Fat stranding in the right pericolic gutter and right lower quadrant related to  appendiceal inflammation. No free air or focal fluid collection. No ascites. Postsurgical change of the upper anterior abdominal wall. Musculoskeletal: There are no acute or suspicious osseous abnormalities. IMPRESSION: 1. Uncomplicated acute appendicitis. 2. Hepatic steatosis. Suggestion of subtle capsular nodularity, raising concern for cirrhosis. Recommend correlation with cirrhosis risk factors. 3. Small hiatal hernia. 4. Unchanged 6 mm splenic artery aneurysm. Aortic Atherosclerosis (ICD10-I70.0). Electronically Signed   By: Keith Rake M.D.   On: 10/15/2020 18:08     Assessment and Plan:   1. CAD s/p CABG and subsequent PCIs - Most recently underwent drug-eluting stent to proximal and mid vein graft to OM 11/05/19. She is instructed to hold Brilinta at end of this month when she completes 12 months of dual antiplatelet therapy. -Brilinta and ASA held this admission for surgery -Recently seen in clinic for chest pain but felt epigastric pain secondary to GERD which resolved after initiation of omeprazole.  Patient canceled her stress test. -Continue BB and Imdur   2. HLD -History of elevated LFTs on statin -Home Zetia and Lopid on hold - 11/04/2019: Cholesterol 196; HDL 35; LDL Cholesterol 83; Triglycerides 392; VLDL 78  - Due for lipid panel   3. Surgical clearance -Patient is getting at least 4 METS of activity.  Recent stress test canceled secondary to resolution of pain after initiation of omeprazole, consistent with GERD. -  Given past medical history and time since last visit, based on ACC/AHA guidelines, [name] would be at acceptable risk for the planned procedure without further cardiovascular testing.  -Restart aspirin for surgery when okay with surgeon -She was supposed to stop her Brilinta end of this month which can be discontinued  4. HTN - BP stable on current medications   For questions or updates, please contact [place] Please consult [email or web address]  for contact info under    Jarrett Soho, PA  10/16/2020 2:41 PM    I have seen and examined the patient along with  Leanor Kail, PA.  I have reviewed the chart, notes and new data.  I agree with PA/NP's note.  Key new complaints: no angina or dyspnea, relatively mild RLQ pain. Key examination changes: normal CV exam Key new findings / data: NSR, old septal and inferior Q waves, no acute repol changes  PLAN: Low-to-moderate risk of CV complications with planned abdominal surgery. Increased risk of bleeding due to Brilinta antiplatelet effect, which takes 5-7 days to resolve. She was due to stop Brilinta this month anyway, will not resume postop, but plan ASA 81 mg daily going forward after appendectomy. Her beta blocker should not be discontinued in the periop period. Echo and coronary angio March 2021 reviewed.  Sanda Klein, MD, [place] [phone] 10/16/2020, 3:28 PM

## 2020-10-16 NOTE — Progress Notes (Signed)
Nutrition Brief Note  Patient identified on the Malnutrition Screening Tool (MST) Report  Wt Readings from Last 15 Encounters:  10/16/20 59 kg  09/29/20 57.9 kg  03/03/20 63.6 kg  11/19/19 65.7 kg  11/07/19 64.8 kg  01/31/18 66.7 kg  05/20/17 65.3 kg  06/18/16 70.3 kg  04/28/16 71.2 kg  12/09/15 68.5 kg  12/08/15 68.6 kg  08/13/14 74.5 kg  07/29/14 72.6 kg  11/07/12 76.6 kg  10/15/12 76.7 kg    Body mass index is 23.03 kg/m. Patient meets criteria for normal based on current BMI.   Current diet order is full liquids. Labs and medications reviewed.   Per MD note, pt has been trying to lose weight and has successfully lost 13 lbs, which has helped control her diabetes. She also reported no change in appetite, nausea, or vomiting during the 4-5 days of abdominal pain PTA.  No nutrition interventions warranted at this time. If nutrition issues arise, please consult RD.   Derrel Nip, RD, LDN Registered Dietitian After Hours/Weekend Pager # in [place]

## 2020-10-16 NOTE — Progress Notes (Signed)
Progress Note     Subjective: Patient reports RLQ pain is about the same as when she came in. Denies nausea or vomiting. Passing flatus and had a BM yesterday AM.   Objective: Vital signs in last 24 hours: Temp:  [98.2 F (36.8 C)-98.6 F (37 C)] 98.6 F (37 C) (03/10 0447) Pulse Rate:  [70-77] 74 (03/10 0447) Resp:  [10-20] 16 (03/10 0447) BP: (102-143)/(59-93) 126/66 (03/10 0447) SpO2:  [97 %-100 %] 99 % (03/10 0447) Weight:  [59 kg] 59 kg (03/10 0224) Last BM Date: 10/15/20  Intake/Output from previous day: 03/09 0701 - 03/10 0700 In: 1466.5 [I.V.:199.4; IV Piggyback:1267.2] Out: 900 [Urine:900] Intake/Output this shift: No intake/output data recorded.  PE: General: pleasant, WD, WN female who is laying in bed in NAD HEENT: head is normocephalic, atraumatic.  Sclera are noninjected.  PERRL.  Ears and nose without any masses or lesions.  Mouth is pink and moist Heart: regular, rate, and rhythm.  Lungs: CTAB, no wheezes, rhonchi, or rales noted.  Respiratory effort nonlabored Abd: soft, ttp in RLQ without peritonitis or guarding, ND, +BS, no masses, hernias, or organomegaly MS: all 4 extremities are symmetrical with no cyanosis, clubbing, or edema. Skin: warm and dry with no masses, lesions, or rashes Neuro: Cranial nerves 2-12 grossly intact, sensation is normal throughout Psych: A&Ox3 with an appropriate affect.    Lab Results:  Recent Labs    10/15/20 1449 10/16/20 0407  WBC 7.3 5.1  HGB 13.1 11.4*  HCT 37.5 31.8*  PLT 318 216   BMET Recent Labs    10/15/20 1449 10/16/20 0407  NA 141 141  K 3.3* 3.4*  CL 104 108  CO2 30 28  GLUCOSE 124* 122*  BUN 10 9  CREATININE 0.95 0.76  CALCIUM 9.6 9.2   PT/INR No results for input(s): LABPROT, INR in the last 72 hours. CMP     Component Value Date/Time   NA 141 10/16/2020 0407   K 3.4 (L) 10/16/2020 0407   CL 108 10/16/2020 0407   CO2 28 10/16/2020 0407   GLUCOSE 122 (H) 10/16/2020 0407   BUN 9  10/16/2020 0407   CREATININE 0.76 10/16/2020 0407   CALCIUM 9.2 10/16/2020 0407   PROT 6.9 10/15/2020 1449   ALBUMIN 4.1 10/15/2020 1449   AST 17 10/15/2020 1449   ALT 17 10/15/2020 1449   ALKPHOS 73 10/15/2020 1449   BILITOT 1.1 10/15/2020 1449   GFRNONAA >60 10/16/2020 0407   GFRAA >60 11/07/2019 0329   Lipase     Component Value Date/Time   LIPASE 53 (H) 10/15/2020 1449       Studies/Results: CT Abdomen Pelvis W Contrast  Result Date: 10/15/2020 CLINICAL DATA:  Right lower quadrant pain. EXAM: CT ABDOMEN AND PELVIS WITH CONTRAST TECHNIQUE: Multidetector CT imaging of the abdomen and pelvis was performed using the standard protocol following bolus administration of intravenous contrast. CONTRAST:  124mL OMNIPAQUE IOHEXOL 300 MG/ML  SOLN COMPARISON:  CT 01/31/2018 FINDINGS: Lower chest: Similar mild peripheral subpleural reticulation in the lung bases. No acute airspace disease or pleural effusion. Coronary artery calcifications. Hepatobiliary: Mild decreased hepatic density consistent with steatosis. No focal hepatic lesion. Suggestion of subtle capsular nodularity. Clips in the gallbladder fossa postcholecystectomy. No biliary dilatation. Pancreas: No ductal dilatation or inflammation. Spleen: Normal in size without focal abnormality. Stable cleft in the medial spleen. Adrenals/Urinary Tract: Normal adrenal glands. No hydronephrosis or perinephric edema. Homogeneous renal enhancement with symmetric excretion on delayed phase imaging. Small cyst in  the lower left kidney, cortical hypodensity in the upper right kidney is too small to characterize. No evidence of solid lesion. No visualized renal stone. Urinary bladder is physiologically distended without wall thickening. Stomach/Bowel: Small hiatal hernia. Stomach otherwise unremarkable. There is no small bowel obstruction, inflammation, or evident wall thickening. Acute appendicitis as described below. Colonic tortuosity with moderate  colonic stool burden. Appendix: Location: Anterior to the cecum. Diameter: 10 mm Appendicolith: Possibly at the base, series 3, image 60. Mucosal hyper-enhancement: Yes Extraluminal gas: No Periappendiceal collection: No. Periappendiceal fat stranding but no extraluminal collection or free air. Vascular/Lymphatic: Advanced aortic atherosclerosis. No aneurysm. Patent portal vein. The splenic vein is tortuous. There is a 6 mm splenic artery aneurysm, unchanged. No acute vascular findings. No abdominopelvic adenopathy. Reproductive: Hysterectomy.  No adnexal mass. Other: Fat stranding in the right pericolic gutter and right lower quadrant related to appendiceal inflammation. No free air or focal fluid collection. No ascites. Postsurgical change of the upper anterior abdominal wall. Musculoskeletal: There are no acute or suspicious osseous abnormalities. IMPRESSION: 1. Uncomplicated acute appendicitis. 2. Hepatic steatosis. Suggestion of subtle capsular nodularity, raising concern for cirrhosis. Recommend correlation with cirrhosis risk factors. 3. Small hiatal hernia. 4. Unchanged 6 mm splenic artery aneurysm. Aortic Atherosclerosis (ICD10-I70.0). Electronically Signed   By: Keith Rake M.D.   On: 10/15/2020 18:08    Anti-infectives: Anti-infectives (From admission, onward)   Start     Dose/Rate Route Frequency Ordered Stop   10/15/20 2200  piperacillin-tazobactam (ZOSYN) IVPB 3.375 g        3.375 g 12.5 mL/hr over 240 Minutes Intravenous Every 8 hours 10/15/20 2146     10/15/20 1830  cefTRIAXone (ROCEPHIN) 2 g in sodium chloride 0.9 % 100 mL IVPB       "And" Linked Group Details   2 g 200 mL/hr over 30 Minutes Intravenous  Once 10/15/20 1815 10/15/20 1939   10/15/20 1830  metroNIDAZOLE (FLAGYL) IVPB 500 mg       "And" Linked Group Details   500 mg 100 mL/hr over 60 Minutes Intravenous  Once 10/15/20 1815 10/15/20 1939       Assessment/Plan CAD s/p DESx2 ~1 yr ago - cards consulted for pre-op  clearance, holding Brilinta (last dose 3:30 PM 3/9) T2DM - SSI GERD HTN HLD Hiatal hernia  Hepatic steatosis   Acute appendicitis  - CT 3/9 with uncomplicated acute appendicitis - WBC 5.1, afebrile - continue IV abx - ttp in RLQ without peritonitis or guarding  - cards to see for clearance, will likely plan lap appendectomy for tomorrow - ok to have FLD today   FEN: FLD, IVF VTE: Brilinta on hold ID: rocephin/flagyl 3/9; Zosyn 3/9>>  LOS: 1 day    Norm Parcel , [facility] Surgery 10/16/2020, 10:01 AM Please see Amion for pager number during day hours 7:00am-4:30pm

## 2020-10-17 ENCOUNTER — Encounter (HOSPITAL_COMMUNITY): Admission: EM | Disposition: A | Discharge status: Home / Self Care | Payer: Self-pay | Source: Home / Self Care

## 2020-10-17 ENCOUNTER — Inpatient Hospital Stay (HOSPITAL_COMMUNITY): Payer: Medicare Other | Admitting: Anesthesiology

## 2020-10-17 ENCOUNTER — Encounter (HOSPITAL_COMMUNITY): Payer: Self-pay

## 2020-10-17 HISTORY — PX: LAPAROSCOPIC APPENDECTOMY: SHX408

## 2020-10-17 LAB — GLUCOSE, CAPILLARY
Glucose-Capillary: 114 mg/dL — ABNORMAL HIGH (ref 70–99)
Glucose-Capillary: 143 mg/dL — ABNORMAL HIGH (ref 70–99)
Glucose-Capillary: 153 mg/dL — ABNORMAL HIGH (ref 70–99)
Glucose-Capillary: 157 mg/dL — ABNORMAL HIGH (ref 70–99)
Glucose-Capillary: 175 mg/dL — ABNORMAL HIGH (ref 70–99)
Glucose-Capillary: 213 mg/dL — ABNORMAL HIGH (ref 70–99)
Glucose-Capillary: 275 mg/dL — ABNORMAL HIGH (ref 70–99)

## 2020-10-17 LAB — TYPE AND SCREEN
ABO/RH(D): B POS
Antibody Screen: NEGATIVE

## 2020-10-17 LAB — ABO/RH: ABO/RH(D): B POS

## 2020-10-17 SURGERY — APPENDECTOMY, LAPAROSCOPIC
Anesthesia: General | Site: Abdomen

## 2020-10-17 MED ORDER — LACTATED RINGERS IV SOLN: INTRAVENOUS | Status: DC | PRN

## 2020-10-17 MED ORDER — MUPIROCIN 2 % EX OINT
1.0000 "application " | TOPICAL_OINTMENT | Freq: Two times a day (BID) | CUTANEOUS | Status: DC
  Administered 2020-10-17 – 2020-10-18 (×3): 1 via NASAL
  Filled 2020-10-17: qty 22

## 2020-10-17 MED ORDER — BUPIVACAINE HCL (PF) 0.25 % IJ SOLN
INTRAMUSCULAR | Status: AC
  Filled 2020-10-17: qty 30

## 2020-10-17 MED ORDER — LIDOCAINE 2% (20 MG/ML) 5 ML SYRINGE
INTRAMUSCULAR | Status: AC
  Filled 2020-10-17: qty 5

## 2020-10-17 MED ORDER — MORPHINE SULFATE (PF) 2 MG/ML IV SOLN: 1.0000 mg | INTRAVENOUS | Status: DC | PRN

## 2020-10-17 MED ORDER — SUFENTANIL CITRATE 50 MCG/ML IV SOLN
INTRAVENOUS | Status: AC
  Filled 2020-10-17: qty 1

## 2020-10-17 MED ORDER — OXYCODONE HCL 5 MG/5ML PO SOLN
5.0000 mg | Freq: Once | ORAL | Status: DC | PRN
Start: 2020-10-17 — End: 2020-10-17

## 2020-10-17 MED ORDER — OXYCODONE HCL 5 MG PO TABS
5.0000 mg | ORAL_TABLET | Freq: Once | ORAL | Status: DC | PRN
Start: 2020-10-17 — End: 2020-10-17

## 2020-10-17 MED ORDER — LIDOCAINE HCL (CARDIAC) PF 100 MG/5ML IV SOSY
PREFILLED_SYRINGE | INTRAVENOUS | Status: DC | PRN
  Administered 2020-10-17: 60 mg via INTRAVENOUS

## 2020-10-17 MED ORDER — DEXAMETHASONE SODIUM PHOSPHATE 10 MG/ML IJ SOLN
INTRAMUSCULAR | Status: AC
  Filled 2020-10-17: qty 1

## 2020-10-17 MED ORDER — ONDANSETRON HCL 4 MG/2ML IJ SOLN
INTRAMUSCULAR | Status: AC
  Filled 2020-10-17: qty 2

## 2020-10-17 MED ORDER — BUPIVACAINE HCL 0.25 % IJ SOLN
INTRAMUSCULAR | Status: DC | PRN
  Administered 2020-10-17: 20 mL

## 2020-10-17 MED ORDER — 0.9 % SODIUM CHLORIDE (POUR BTL) OPTIME
TOPICAL | Status: DC | PRN
  Administered 2020-10-17: 1000 mL

## 2020-10-17 MED ORDER — FENTANYL CITRATE (PF) 100 MCG/2ML IJ SOLN
INTRAMUSCULAR | Status: AC
  Filled 2020-10-17: qty 2

## 2020-10-17 MED ORDER — PROPOFOL 10 MG/ML IV BOLUS
INTRAVENOUS | Status: AC
  Filled 2020-10-17: qty 20

## 2020-10-17 MED ORDER — FENTANYL CITRATE (PF) 100 MCG/2ML IJ SOLN
25.0000 ug | INTRAMUSCULAR | Status: DC | PRN
  Administered 2020-10-17: 25 ug via INTRAVENOUS

## 2020-10-17 MED ORDER — ROCURONIUM BROMIDE 10 MG/ML (PF) SYRINGE
PREFILLED_SYRINGE | INTRAVENOUS | Status: AC
  Filled 2020-10-17: qty 10

## 2020-10-17 MED ORDER — SODIUM CHLORIDE (PF) 0.9 % IJ SOLN
INTRAMUSCULAR | Status: AC
  Filled 2020-10-17: qty 10

## 2020-10-17 MED ORDER — DEXAMETHASONE SODIUM PHOSPHATE 10 MG/ML IJ SOLN
INTRAMUSCULAR | Status: DC | PRN
  Administered 2020-10-17: 10 mg via INTRAVENOUS

## 2020-10-17 MED ORDER — PHENYLEPHRINE HCL (PRESSORS) 10 MG/ML IV SOLN
INTRAVENOUS | Status: DC | PRN
  Administered 2020-10-17 (×2): 80 ug via INTRAVENOUS

## 2020-10-17 MED ORDER — SODIUM CHLORIDE 0.9 % IR SOLN
Status: DC | PRN
  Administered 2020-10-17: 1000 mL

## 2020-10-17 MED ORDER — SUFENTANIL CITRATE 50 MCG/ML IV SOLN
INTRAVENOUS | Status: DC | PRN
  Administered 2020-10-17 (×2): 10 ug via INTRAVENOUS

## 2020-10-17 MED ORDER — PHENYLEPHRINE 40 MCG/ML (10ML) SYRINGE FOR IV PUSH (FOR BLOOD PRESSURE SUPPORT)
PREFILLED_SYRINGE | INTRAVENOUS | Status: AC
  Filled 2020-10-17: qty 10

## 2020-10-17 MED ORDER — PROPOFOL 10 MG/ML IV BOLUS
INTRAVENOUS | Status: DC | PRN
  Administered 2020-10-17: 100 mg via INTRAVENOUS

## 2020-10-17 MED ORDER — ONDANSETRON HCL 4 MG/2ML IJ SOLN
INTRAMUSCULAR | Status: DC | PRN
  Administered 2020-10-17: 4 mg via INTRAVENOUS

## 2020-10-17 MED ORDER — SUGAMMADEX SODIUM 200 MG/2ML IV SOLN
INTRAVENOUS | Status: DC | PRN
  Administered 2020-10-17: 200 mg via INTRAVENOUS

## 2020-10-17 MED ORDER — ROCURONIUM 10MG/ML (10ML) SYRINGE FOR MEDFUSION PUMP - OPTIME
INTRAVENOUS | Status: DC | PRN
  Administered 2020-10-17: 70 mg via INTRAVENOUS

## 2020-10-17 MED ORDER — ONDANSETRON HCL 4 MG/2ML IJ SOLN: 4.0000 mg | Freq: Once | INTRAMUSCULAR | Status: DC | PRN

## 2020-10-17 SURGICAL SUPPLY — 42 items
ADH SKN CLS APL DERMABOND .7 (GAUZE/BANDAGES/DRESSINGS) ×1
ADH SKN CLS LQ APL DERMABOND (GAUZE/BANDAGES/DRESSINGS) ×1
APL PRP STRL LF DISP 70% ISPRP (MISCELLANEOUS) ×1
APPLIER CLIP 5 13 M/L LIGAMAX5 (MISCELLANEOUS) ×2
APR CLP MED LRG 5 ANG JAW (MISCELLANEOUS) ×1
BAG SPEC RTRVL LRG 6X4 10 (ENDOMECHANICALS) ×1
CANISTER SUCT 3000ML PPV (MISCELLANEOUS) ×2 IMPLANT
CHLORAPREP W/TINT 26 (MISCELLANEOUS) ×2 IMPLANT
CLIP APPLIE 5 13 M/L LIGAMAX5 (MISCELLANEOUS) IMPLANT
COVER SURGICAL LIGHT HANDLE (MISCELLANEOUS) ×2 IMPLANT
COVER WAND RF STERILE (DRAPES) ×2 IMPLANT
CUTTER FLEX LINEAR 45M (STAPLE) ×1 IMPLANT
DERMABOND ADHESIVE PROPEN (GAUZE/BANDAGES/DRESSINGS) ×1
DERMABOND ADVANCED (GAUZE/BANDAGES/DRESSINGS) ×1
DERMABOND ADVANCED .7 DNX12 (GAUZE/BANDAGES/DRESSINGS) ×1 IMPLANT
DERMABOND ADVANCED .7 DNX6 (GAUZE/BANDAGES/DRESSINGS) IMPLANT
ELECT REM PT RETURN 9FT ADLT (ELECTROSURGICAL) ×2
ELECTRODE REM PT RTRN 9FT ADLT (ELECTROSURGICAL) ×1 IMPLANT
GLOVE SURG SIGNA 7.5 PF LTX (GLOVE) ×2 IMPLANT
GOWN STRL REUS W/ TWL LRG LVL3 (GOWN DISPOSABLE) ×2 IMPLANT
GOWN STRL REUS W/ TWL XL LVL3 (GOWN DISPOSABLE) ×1 IMPLANT
GOWN STRL REUS W/TWL LRG LVL3 (GOWN DISPOSABLE) ×4
GOWN STRL REUS W/TWL XL LVL3 (GOWN DISPOSABLE) ×2
KIT BASIN OR (CUSTOM PROCEDURE TRAY) ×2 IMPLANT
KIT TURNOVER KIT B (KITS) ×2 IMPLANT
NS IRRIG 1000ML POUR BTL (IV SOLUTION) ×2 IMPLANT
PAD ARMBOARD 7.5X6 YLW CONV (MISCELLANEOUS) ×4 IMPLANT
POUCH SPECIMEN RETRIEVAL 10MM (ENDOMECHANICALS) ×2 IMPLANT
RELOAD STAPLE 45 3.5 BLU ETS (ENDOMECHANICALS) IMPLANT
RELOAD STAPLE TA45 3.5 REG BLU (ENDOMECHANICALS) ×2 IMPLANT
SET IRRIG TUBING LAPAROSCOPIC (IRRIGATION / IRRIGATOR) ×2 IMPLANT
SET TUBE SMOKE EVAC HIGH FLOW (TUBING) ×2 IMPLANT
SHEARS HARMONIC ACE PLUS 36CM (ENDOMECHANICALS) ×2 IMPLANT
SLEEVE ENDOPATH XCEL 5M (ENDOMECHANICALS) ×2 IMPLANT
SPECIMEN JAR SMALL (MISCELLANEOUS) ×2 IMPLANT
SUT MON AB 4-0 PC3 18 (SUTURE) ×2 IMPLANT
TOWEL GREEN STERILE (TOWEL DISPOSABLE) ×2 IMPLANT
TOWEL GREEN STERILE FF (TOWEL DISPOSABLE) ×2 IMPLANT
TRAY LAPAROSCOPIC MC (CUSTOM PROCEDURE TRAY) ×2 IMPLANT
TROCAR XCEL BLUNT TIP 100MML (ENDOMECHANICALS) ×2 IMPLANT
TROCAR XCEL NON-BLD 5MMX100MML (ENDOMECHANICALS) ×2 IMPLANT
WATER STERILE IRR 1000ML POUR (IV SOLUTION) ×2 IMPLANT

## 2020-10-17 NOTE — Anesthesia Postprocedure Evaluation (Signed)
Anesthesia Post Note  Patient: [name]  Procedure(s) Performed: APPENDECTOMY LAPAROSCOPIC (N/A Abdomen)     Patient location during evaluation: PACU Anesthesia Type: General Level of consciousness: awake and alert, oriented and patient cooperative Pain management: pain level controlled Vital Signs Assessment: post-procedure vital signs reviewed and stable Respiratory status: spontaneous breathing, nonlabored ventilation and respiratory function stable Cardiovascular status: blood pressure returned to baseline and stable Postop Assessment: no apparent nausea or vomiting Anesthetic complications: no   No complications documented.  Last Vitals:  Vitals:   10/17/20 1030 10/17/20 1049  BP: (!) 142/76 (!) 152/86  Pulse: 87 86  Resp: 16 15  Temp:  37.1 C  SpO2: 91% 95%    Last Pain:  Vitals:   10/17/20 1049  TempSrc: Oral  PainSc:                  Pervis Hocking

## 2020-10-17 NOTE — Anesthesia Procedure Notes (Signed)

## 2020-10-17 NOTE — Op Note (Signed)
Appendectomy, Lap, Procedure Note  Indications: The patient presented with a history of right-sided abdominal pain. A CT revealed findings consistent with acute appendicitis. Surgery was held for a day for cardiac clearance and to come of anti-platelet meds  Pre-operative Diagnosis: acute appendicitis  Post-operative Diagnosis: Same  Surgeon: Coralie Keens   Assistants: 0  Anesthesia: General endotracheal anesthesia  ASA Class: 2  Procedure Details  The patient was seen again in the Holding Room. The risks, benefits, complications, treatment options, and expected outcomes were discussed with the patient and/or family. The possibilities of reaction to medication, perforation of viscus, bleeding, recurrent infection, finding a normal appendix, the need for additional procedures, failure to diagnose a condition, and creating a complication requiring transfusion or operation were discussed. There was concurrence with the proposed plan and informed consent was obtained. The site of surgery was properly noted. The patient was taken to Operating Room, identified as [name] and the procedure verified as Appendectomy. A Time Out was held and the above information confirmed.  The patient was placed in the supine position and general anesthesia was induced, along with placement of orogastric tube, Venodyne boots, and a Foley catheter. The abdomen was prepped and draped in a sterile fashion. A one centimeter infraumbilical incision was made.  The umbilical stalk was elevated, and the midline fascia was incised with a #11 blade.  A Kelly clamp was used to confirm entrance into the peritoneal cavity.  A pursestring suture was passed around the incision with a 0 Vicryl.  The Hasson was introduced into the abdomen and the tails of the suture were used to hold the Hasson in place.   The pneumoperitoneum was then established to steady pressure of 15 mmHg.  Additional 5 mm cannulas then placed in the  left lower quadrant of the abdomen and the suprapubic region under direct visualization. A careful evaluation of the entire abdomen was carried out. The patient was placed in Trendelenburg and left lateral decubitus position. The small intestines were retracted in the cephalad and left lateral direction away from the pelvis and right lower quadrant. The patient was found to have an enlarged and inflamed appendix that was extending into the pelvis. There was no evidence of perforation.  The appendix was carefully dissected. The appendix was was skeletonized with the harmonic scalpel.   The appendix was divided at its base using an endo-GIA stapler. Minimal appendiceal stump was left in place. There was no evidence of bleeding, leakage, or complication after division of the appendix. Irrigation was also performed and irrigate suctioned from the abdomen as well.  The umbilical port site was closed with the purse string suture. There was no residual palpable fascial defect.  The trocar site skin wounds were closed with 4-0 Monocryl.  Instrument, sponge, and needle counts were correct at the conclusion of the case.   Findings: The appendix was found to be inflamed. There were not signs of necrosis.  There was not perforation. There was not abscess formation.  Estimated Blood Loss:  Minimal        Complications:  None; patient tolerated the procedure well.         Disposition: PACU - hemodynamically stable.         Condition: stable

## 2020-10-17 NOTE — Progress Notes (Signed)
Patient ID: [name], female   DOB: 06/11/1946, 75 y.o.   MRN: [id number]   Pre Procedure note for inpatients:   [name] has been scheduled for Procedure(s) with comments: APPENDECTOMY LAPAROSCOPIC (N/A) - ROOM 1 DOW ROOM STARTING AT 09:00AM FOR 60 MIN today. The various methods of treatment have been discussed with the patient. After consideration of the risks, benefits and treatment options the patient has consented to the planned procedure.   The patient has been seen and labs reviewed. There are no changes in the patient's condition to prevent proceeding with the planned procedure today.  Recent labs:  Lab Results  Component Value Date   WBC 5.1 10/16/2020   HGB 11.4 (L) 10/16/2020   HCT 31.8 (L) 10/16/2020   PLT 216 10/16/2020   GLUCOSE 122 (H) 10/16/2020   CHOL 196 11/04/2019   TRIG 392 (H) 11/04/2019   HDL 35 (L) 11/04/2019   LDLDIRECT 130.6 06/07/2011   LDLCALC 83 11/04/2019   ALT 17 10/15/2020   AST 17 10/15/2020   NA 141 10/16/2020   K 3.4 (L) 10/16/2020   CL 108 10/16/2020   CREATININE 0.76 10/16/2020   BUN 9 10/16/2020   CO2 28 10/16/2020   TSH 1.639 10/15/2012   INR 1.03 04/04/2012   HGBA1C 6.0 (H) 10/16/2020    Coralie Keens, MD 10/17/2020 7:29 AM

## 2020-10-17 NOTE — Discharge Instructions (Signed)
[facility] LAPAROSCOPIC SURGERY: POST OP INSTRUCTIONS Always review your discharge instruction sheet given to you by the facility where your surgery was performed. IF YOU HAVE DISABILITY OR FAMILY LEAVE FORMS, YOU MUST BRING THEM TO THE OFFICE FOR PROCESSING.   DO NOT GIVE THEM TO YOUR DOCTOR.  PAIN CONTROL  1. First take acetaminophen (Tylenol) AND/or ibuprofen (Advil) to control your pain after surgery.  Follow directions on package.  Taking acetaminophen (Tylenol) and/or ibuprofen (Advil) regularly after surgery will help to control your pain and lower the amount of prescription pain medication you may need.  You should not take more than 3,000 mg (3 grams) of acetaminophen (Tylenol) in 24 hours.  You should not take ibuprofen (Advil), aleve, motrin, naprosyn or other NSAIDS if you have a history of stomach ulcers or chronic kidney disease.  2. A prescription for pain medication may be given to you upon discharge.  Take your pain medication as prescribed, if you still have uncontrolled pain after taking acetaminophen (Tylenol) or ibuprofen (Advil). 3. Use ice packs to help control pain. 4. If you need a refill on your pain medication, please contact your pharmacy.  They will contact our office to request authorization. Prescriptions will not be filled after 5pm or on week-ends.  HOME MEDICATIONS 5. Take your usually prescribed medications unless otherwise directed.  DIET 6. You should follow a light diet the first few days after arrival home.  Be sure to include lots of fluids daily. Avoid fatty, fried foods.   CONSTIPATION 7. It is common to experience some constipation after surgery and if you are taking pain medication.  Increasing fluid intake and taking a stool softener (such as Colace) will usually help or prevent this problem from occurring.  A mild laxative (Milk of Magnesia or Miralax) should be taken according to package instructions if there are no bowel  movements after 48 hours.  WOUND/INCISION CARE 8. Most patients will experience some swelling and bruising in the area of the incisions.  Ice packs will help.  Swelling and bruising can take several days to resolve.  9. Unless discharge instructions indicate otherwise, follow guidelines below  a. STERI-STRIPS - you may remove your outer bandages 48 hours after surgery, and you may shower at that time.  You have steri-strips (small skin tapes) in place directly over the incision.  These strips should be left on the skin for 7-10 days.   b. DERMABOND/SKIN GLUE - you may shower in 24 hours.  The glue will flake off over the next 2-3 weeks. 10. Any sutures or staples will be removed at the office during your follow-up visit.  ACTIVITIES 11. You may resume regular (light) daily activities beginning the next day--such as daily self-care, walking, climbing stairs--gradually increasing activities as tolerated.  You may have sexual intercourse when it is comfortable.  Refrain from any heavy lifting or straining until approved by your doctor. a. You may drive when you are no longer taking prescription pain medication, you can comfortably wear a seatbelt, and you can safely maneuver your car and apply brakes.  FOLLOW-UP 12. You should see your doctor in the office for a follow-up appointment approximately 2-3 weeks after your surgery.  You should have been given your post-op/follow-up appointment when your surgery was scheduled.  If you did not receive a post-op/follow-up appointment, make sure that you call for this appointment within a day or two after you arrive home to insure a convenient appointment time.  WHEN   TO CALL YOUR DOCTOR: 1. Fever over 101.0 2. Inability to urinate 3. Continued bleeding from incision. 4. Increased pain, redness, or drainage from the incision. 5. Increasing abdominal pain  The clinic staff is available to answer your questions during regular business hours.  Please don't  hesitate to call and ask to speak to one of the nurses for clinical concerns.  If you have a medical emergency, go to the nearest emergency room or call 911.  A surgeon from [facility] is always on call at the hospital. [address], [address], [place], [place]  [address] ? [address] [address], [place], [place]   [address] [phone] ? [phone] ? FAX [phone] Web site: [email or web address]  

## 2020-10-17 NOTE — Anesthesia Preprocedure Evaluation (Addendum)
Anesthesia Evaluation  Patient identified by MRN, date of birth, ID band Patient awake    Reviewed: Allergy & Precautions, NPO status , Patient's Chart, lab work & pertinent test results, reviewed documented beta blocker date and time   Airway Mallampati: III  TM Distance: >3 FB Neck ROM: Full    Dental no notable dental hx. (+) Edentulous Upper, Edentulous Lower   Pulmonary neg pulmonary ROS,    Pulmonary exam normal breath sounds clear to auscultation       Cardiovascular hypertension, Pt. on medications and Pt. on home beta blockers + CAD, + Past MI, + Cardiac Stents (10/2019, on brillinta/aspirin DAPT (brillinta D/Ced 3/9)), + CABG (2005) and +CHF (grade 1 diastolic dysfunction, normal LVEF one cho 10/2019)  Normal cardiovascular exam Rhythm:Regular Rate:Normal  Echo 10/2019: 1. Left ventricular ejection fraction, by estimation, is 65 to 70%. The  left ventricle has normal function. The left ventricle has no regional  wall motion abnormalities. There is mild asymmetric left ventricular  hypertrophy. Left ventricular diastolic  parameters are consistent with Grade I diastolic dysfunction (impaired  relaxation).  2. Right ventricular systolic function is normal. The right ventricular  size is normal. There is normal pulmonary artery systolic pressure.  3. Left atrial size was mildly dilated.  4. The mitral valve is normal in structure. No evidence of mitral valve  regurgitation. No evidence of mitral stenosis.  5. The aortic valve is normal in structure. Aortic valve regurgitation is  mild. No aortic stenosis is present.   Cath 10/2019:  Only imaged SVG to OM.  Origin to Prox Graft lesion before 2nd Mrg is 70% stenosed. A drug-eluting stent was successfully placed using a SYNERGY XD 3.50X28, post dilated to 3.8 and optimized by IVUS.  Post intervention, there is a 0% residual stenosis.  Mid Graft lesion before 2nd Mrg is  90% stenosed. A drug-eluting stent was successfully placed using a SYNERGY XD 3.50X12, optimized with IVUS.  Post intervention, there is a 0% residual stenosis.  Dist Graft to Insertion lesion before 2nd Mrg is 50% stenosed.   Continue dual antiplatelet therapy and plan for discharge tomorrow.      severe triple vessel CAD with 4/4 patent grafts. SVG-OM had three areas of stenosis, but good flow. CTA negative for PE. No other cause of chest pain was found. She was taken back to the cath lab for DES x 2 to proximal and mid SVG-OM.   Neuro/Psych PSYCHIATRIC DISORDERS Depression negative neurological ROS     GI/Hepatic Neg liver ROS, GERD  Controlled and Medicated,Acute appendicitis   Endo/Other  diabetes, Well Controlled, Type 2, Oral Hypoglycemic Agentsa1c 6.0 FS 143 this morning  Renal/GU negative Renal ROS  negative genitourinary   Musculoskeletal negative musculoskeletal ROS (+)   Abdominal   Peds  Hematology  (+) Blood dyscrasia, anemia , H/H 11.4/31.8   Anesthesia Other Findings   Reproductive/Obstetrics negative OB ROS                          Anesthesia Physical Anesthesia Plan  ASA: III  Anesthesia Plan: General   Post-op Pain Management:    Induction: Intravenous  PONV Risk Score and Plan: 4 or greater and Ondansetron, Dexamethasone and Treatment may vary due to age or medical condition  Airway Management Planned: Oral ETT  Additional Equipment: None  Intra-op Plan:   Post-operative Plan: Extubation in OR  Informed Consent: I have reviewed the patients History and Physical, chart, labs  and discussed the procedure including the risks, benefits and alternatives for the proposed anesthesia with the patient or authorized representative who has indicated his/her understanding and acceptance.     Dental advisory given  Plan Discussed with: CRNA  Anesthesia Plan Comments: (Has only been off brilinta x 24h- will order type and  screen)       Anesthesia Quick Evaluation

## 2020-10-17 NOTE — Progress Notes (Signed)
Pt sitting on chair watching TV.  Pt comfortable, needs denied.

## 2020-10-17 NOTE — Transfer of Care (Signed)
Immediate Anesthesia Transfer of Care Note  Patient: [name]  Procedure(s) Performed: APPENDECTOMY LAPAROSCOPIC (N/A Abdomen)  Patient Location: PACU  Anesthesia Type:General  Level of Consciousness: oriented, drowsy, patient cooperative and responds to stimulation  Airway & Oxygen Therapy: Patient Spontanous Breathing and Patient connected to nasal cannula oxygen  Post-op Assessment: Report given to RN, Post -op Vital signs reviewed and stable and Patient moving all extremities X 4  Post vital signs: Reviewed and stable  Last Vitals:  Vitals Value Taken Time  BP    Temp    Pulse    Resp    SpO2      Last Pain:  Vitals:   10/16/20 2118  TempSrc:   PainSc: 0-No pain         Complications: No complications documented.

## 2020-10-18 ENCOUNTER — Encounter (HOSPITAL_COMMUNITY): Payer: Self-pay | Admitting: Surgery

## 2020-10-18 LAB — GLUCOSE, CAPILLARY
Glucose-Capillary: 152 mg/dL — ABNORMAL HIGH (ref 70–99)
Glucose-Capillary: 204 mg/dL — ABNORMAL HIGH (ref 70–99)

## 2020-10-18 MED ORDER — ACETAMINOPHEN 325 MG PO TABS
325.0000 mg | ORAL_TABLET | Freq: Four times a day (QID) | ORAL | Status: AC | PRN
Start: 2020-10-18 — End: ?

## 2020-10-18 NOTE — Plan of Care (Signed)

## 2020-10-18 NOTE — Progress Notes (Signed)
AVS given and reviewed with pt. Medications discussed. All questions answered to satisfaction. Pt verbalized understanding of information given. Pt escorted off the unit via wheelchair by staff member.

## 2020-10-18 NOTE — Discharge Summary (Signed)
[facility] Discharge Summary   Patient ID: [name] MRN: [id number] DOB/AGE: 16-Sep-1945 75 y.o.  Admit date: 10/15/2020 Discharge date: 10/18/2020  Admitting Diagnosis: Acute appendicitis   Discharge Diagnosis Patient Active Problem List   Diagnosis Date Noted  . Acute appendicitis 10/15/2020  . Hepatotoxicity due to statin drug 03/21/2020  . NSTEMI (non-ST elevated myocardial infarction) ([employer]) 11/02/2019  . Non-ST elevation (NSTEMI) myocardial infarction ([place])   . Uncontrolled type 2 diabetes mellitus with hyperglycemia ([place]) 05/22/2017  . Intractable vomiting with nausea 05/21/2017  . Diabetes mellitus without complication ([place]) 48/54/6270  . C. difficile colitis 05/20/2017  . Hypokalemia 05/20/2017  . Nausea alone 09/20/2012  . Nonspecific abnormal results of liver function study 09/20/2012  . Elevated transaminase level   . Dyspepsia and other specified disorders of function of stomach 05/18/2011  . FATTY LIVER DISEASE 01/09/2010  . Hyperlipidemia 01/25/2009  . Depression 01/25/2009  . Essential hypertension 01/25/2009  . Coronary atherosclerosis 01/25/2009  . BACK PAIN 01/25/2009  . HLD (hyperlipidemia) 01/25/2009  . Chest pain 01/25/2009  . GERD 09/03/2008  . DYSPHAGIA 09/03/2008  . DIABETES MELLITUS, BORDERLINE 09/03/2008    Consultants Cardiology   Imaging: No results found.  Procedures Dr. Coralie Keens (10/17/20) - Laparoscopic Appendectomy  Hospital Course:  Patient is a 75 year old female who [facility] with abdominal [facility].  Workup showed acute appendicitis.  Patient was admitted and underwent procedure listed above once cleared by cardiology.  Tolerated procedure well and was transferred to the floor.  Diet was advanced as tolerated.  On POD#1, the patient was voiding well, tolerating diet, ambulating well, pain well controlled, vital signs stable, incisions c/d/i and felt stable for discharge home.  Patient will follow up in  our office in 3-4 weeks and knows to call with questions or concerns.  She will call to confirm appointment date/time.  Cardiology recommended stopping Brilinta upon discharge as they were planning on stopping at the end of this month.   Physical Exam: General:  Alert, NAD, pleasant, comfortable Abd:  Soft, ND, mild tenderness, incisions C/D/I   I or a member of my team have reviewed this patient in the Controlled Substance Database.   Allergies as of 10/18/2020      Reactions   Aspirin Nausea Only      Medication List    STOP taking these medications   ticagrelor 90 MG Tabs tablet Commonly known as: BRILINTA     TAKE these medications   acetaminophen 325 MG tablet Commonly known as: TYLENOL Take 1 tablet (325 mg total) by mouth every 6 (six) hours as needed for mild pain (or temp > 100).   amLODipine 10 MG tablet Commonly known as: NORVASC Take 10 mg by mouth daily with lunch.   aspirin 81 MG chewable tablet Chew 1 tablet (81 mg total) by mouth daily.   atenolol 50 MG tablet Commonly known as: TENORMIN Take 75 mg by mouth daily with lunch.   cloNIDine 0.2 MG tablet Commonly known as: CATAPRES Take 1 tablet (0.2 mg total) by mouth at bedtime. What changed: when to take this   ezetimibe 10 MG tablet Commonly known as: ZETIA Take 1 tablet (10 mg total) by mouth daily.   furosemide 40 MG tablet Commonly known as: LASIX Take 40 mg by mouth daily.   gemfibrozil 600 MG tablet Commonly known as: LOPID Take 600 mg by mouth daily.   HYDROcodone-acetaminophen 10-325 MG tablet Commonly known as: NORCO Take 1 tablet by  mouth every 8 (eight) hours as needed for pain.   isosorbide mononitrate 30 MG 24 hr tablet Commonly known as: IMDUR Take 1 tablet (30 mg total) by mouth daily.   lisinopril 10 MG tablet Commonly known as: ZESTRIL Take 10 mg by mouth daily.   MAGNESIUM PO Take 0.5 tablets by mouth daily with lunch.   nitroGLYCERIN 0.4 MG SL tablet Commonly known  as: NITROSTAT Place 1 tablet (0.4 mg total) under the tongue every 5 (five) minutes as needed for chest pain.   omeprazole 20 MG capsule Commonly known as: PRILOSEC Take 20 mg by mouth daily.   PARoxetine 40 MG tablet Commonly known as: PAXIL Take 40 mg by mouth every morning.   zolpidem 10 MG tablet Commonly known as: AMBIEN Take 5 mg by mouth at bedtime.         Follow-up Information    [facility], [place]. Go on 11/06/2020.   Specialty: General Surgery Why: 1:45 PM. Please arrive 30 min prior to appointment time. Bring photo ID and insurance information.  Contact information: [address] [place] [address] [phone]        Curlene Labrum, MD. Call.   Specialty: Family Medicine Why: Call as needed  Contact information: [place] [address] [phone]        Lelon Perla, MD. Call.   Specialty: Cardiology Why: Call and schedule follow up  Contact information: [place] [address] [phone]               Signed: Norm Parcel , [facility] 10/18/2020, 8:27 AM Please see Amion for pager number during day hours 7:00am-4:30pm

## 2020-10-20 LAB — SURGICAL PATHOLOGY

## 2020-11-02 NOTE — Progress Notes (Signed)
Cardiology Office Note:    Date:  11/03/2020   ID:  Atl[place], Delong [birth year], MRN [id number]  PCP:  Curlene Labrum, MD  Cardiologist:  Kirk Ruths, MD   Referring MD: Curlene Labrum, MD   Chief Complaint  Patient presents with  . Follow-up    Chest discomfort    History of Present Illness:    [name] is a 75 y.o. female with a hx of CAD, HTN, DM, and HLD. She had CABG in 2005 and subsequent PCI of SVG-RCA. NSTEMI in 10/2019 led to heart cath that showed severe triple vessel CAD with 4/4 patent grafts. SVG-OM had three areas of stenosis, but good flow. CTA negative for PE. No other cause of chest pain was found. She was taken back to the cath lab for DES x 2 to proximal and mid SVG-OM. She was placed on DAPT. Echo at that time showed normal EF, grade 1 DD, mild LVH, mild AI. She has 3.1 cm dilation of proximal descending thoracic aorta. She was last seen by Dr. Stanford Breed 03/03/20 and was doing well at that time. I saw her 09/29/20 in clinic and she complained of ongoing chest pain. I encouraged her to go the ER, but she declined. HS troponin in the office was negative.  I then ordered a nuclear stress test, but patient called and canceled bc CP resolved after she started taking omeprazole.   She presented to [facility] 10/15/20 with abdominal pain. CT imaging showed uncomplicated acute appendicitis and possible cirrhosis. Cardiology was consulted for preop clearance. She underwent appendectomy without cardiac complications.   She returns today for follow up. Her chest/epigastric discomfort was due to appendicitis.  All discomfort has resolved since appednectomy. She is recovering, no new complaints today. We discussed her LDL goal.    Past Medical History:  Diagnosis Date  . Acute myocardial infarction, unspecified site, episode of care unspecified   . Coronary artery disease status post CABG 2005    a. s/p CABG 2005. b. 2011 - DES of SVG to PDA. c. Admitted with CP 03/2012 with  stable anatomy by cath, felt noncardiac pain.  . Depression   . Diabetes mellitus   . Elevated LFTs   . Elevated transaminase level    Most recently October 2012 most recent  . Esophageal reflux   . GERD (gastroesophageal reflux disease)   . Hernia   . History of cholecystectomy   . Hyperlipidemia    a. Not on statin due to elevated liver enzymes.  . Hypertension   . Kidney cysts    left    Past Surgical History:  Procedure Laterality Date  . ABDOMINAL HYSTERECTOMY    . BYPASS GRAFT  08/2003   Quadruple   . CHOLECYSTECTOMY    . CORONARY STENT INTERVENTION N/A 11/05/2019   Procedure: CORONARY STENT INTERVENTION;  Surgeon: Jettie Booze, MD;  Location: [place] [facility];  Service: Cardiovascular;  Laterality: N/A;  . HERNIA REPAIR Right 01/2011  . INTRAVASCULAR ULTRASOUND/IVUS N/A 11/05/2019   Procedure: Intravascular Ultrasound/IVUS;  Surgeon: Jettie Booze, MD;  Location: [place] [facility];  Service: Cardiovascular;  Laterality: N/A;  . LAPAROSCOPIC APPENDECTOMY N/A 10/17/2020   Procedure: APPENDECTOMY LAPAROSCOPIC;  Surgeon: Coralie Keens, MD;  Location: [place];  Service: General;  Laterality: N/A;  . LEFT HEART CATH AND CORS/GRAFTS ANGIOGRAPHY N/A 11/02/2019   Procedure: LEFT HEART CATH AND CORS/GRAFTS ANGIOGRAPHY;  Surgeon: Burnell Blanks, MD;  Location: [place] [facility];  Service: Cardiovascular;  Laterality: N/A;  . LEFT HEART CATHETERIZATION WITH CORONARY/GRAFT ANGIOGRAM N/A 04/05/2012   Procedure: LEFT HEART CATHETERIZATION WITH Beatrix Fetters;  Surgeon: Sherren Mocha, MD;  Location: [facility];  Service: Cardiovascular;  Laterality: N/A;    Current Medications: Current Meds  Medication Sig  . acetaminophen (TYLENOL) 325 MG tablet Take 1 tablet (325 mg total) by mouth every 6 (six) hours as needed for mild pain (or temp > 100).  Marland Kitchen amLODipine (NORVASC) 10 MG tablet Take 10 mg by mouth daily with lunch.  Marland Kitchen aspirin 81 MG chewable tablet  Chew 1 tablet (81 mg total) by mouth daily.  Marland Kitchen atenolol (TENORMIN) 50 MG tablet Take 75 mg by mouth daily with lunch.  . cloNIDine (CATAPRES) 0.2 MG tablet Take 0.2 mg by mouth daily. 1 Tablet Daily  . ezetimibe (ZETIA) 10 MG tablet Take 1 tablet (10 mg total) by mouth daily.  . furosemide (LASIX) 40 MG tablet Take 40 mg by mouth daily.  Marland Kitchen gemfibrozil (LOPID) 600 MG tablet Take 600 mg by mouth daily.   Marland Kitchen HYDROcodone-acetaminophen (NORCO) 10-325 MG tablet Take 1 tablet by mouth every 8 (eight) hours as needed for pain.  . isosorbide mononitrate (IMDUR) 30 MG 24 hr tablet Take 1 tablet (30 mg total) by mouth daily.  Marland Kitchen lisinopril (ZESTRIL) 10 MG tablet Take 10 mg by mouth daily.  Marland Kitchen MAGNESIUM PO Take 0.5 tablets by mouth daily with lunch.  . nitroGLYCERIN (NITROSTAT) 0.4 MG SL tablet Place 1 tablet (0.4 mg total) under the tongue every 5 (five) minutes as needed for chest pain.  Marland Kitchen omeprazole (PRILOSEC) 20 MG capsule Take 20 mg by mouth daily.  Marland Kitchen PARoxetine (PAXIL) 40 MG tablet Take 40 mg by mouth every morning.  . zolpidem (AMBIEN) 10 MG tablet Take 5 mg by mouth at bedtime.      Allergies:   Aspirin   Social History   Socioeconomic History  . Marital status: Married    Spouse name: Glendell Docker  . Number of children: 2  . Years of education: Not on file  . Highest education level: Not on file  Occupational History  . Occupation: Part-Time hair-dresser  Tobacco Use  . Smoking status: Never Smoker  . Smokeless tobacco: Never Used  Vaping Use  . Vaping Use: Never used  Substance and Sexual Activity  . Alcohol use: No  . Drug use: No  . Sexual activity: Not on file  Other Topics Concern  . Not on file  Social History Narrative  . Not on file   Social Determinants of Health   Financial Resource Strain: Not on file  Food Insecurity: Not on file  Transportation Needs: Not on file  Physical Activity: Not on file  Stress: Not on file  Social Connections: Not on file     Family  History: The patient's family history includes Addison's disease in her sister; Cancer in her mother; Diabetes in her mother; Heart attack in her brother; Lung cancer in her daughter. There is no history of Colon cancer.  ROS:   Please see the history of present illness.     All other systems reviewed and are negative.  EKGs/Labs/Other Studies Reviewed:    The following studies were reviewed today:  Coronary stent intervention 11/05/19:  Only imaged SVG to OM.  Origin to Prox Graft lesion before 2nd Mrg is 70% stenosed. A drug-eluting stent was successfully placed using a SYNERGY XD 3.50X28, post dilated to 3.8 and optimized by IVUS.  Post intervention, there is a 0% residual stenosis.  Mid Graft lesion before 2nd Mrg is 90% stenosed. A drug-eluting stent was successfully placed using a SYNERGY XD 3.50X12, optimized with IVUS.  Post intervention, there is a 0% residual stenosis.  Dist Graft to Insertion lesion before 2nd Mrg is 50% stenosed.   Continue dual antiplatelet therapy and plan for discharge tomorrow.    EKG:  EKG is  ordered today.  The ekg ordered today demonstrates sinus rhythm HR 69, poor R wave progression seen on prior tracings  Recent Labs: 10/15/2020: ALT 17 10/16/2020: BUN 9; Creatinine, Ser 0.76; Hemoglobin 11.4; Platelets 216; Potassium 3.4; Sodium 141  Recent Lipid Panel    Component Value Date/Time   CHOL 196 11/04/2019 0130   TRIG 392 (H) 11/04/2019 0130   HDL 35 (L) 11/04/2019 0130   CHOLHDL 5.6 11/04/2019 0130   VLDL 78 (H) 11/04/2019 0130   LDLCALC 83 11/04/2019 0130   LDLDIRECT 130.6 06/07/2011 0842    Physical Exam:    VS:  BP 130/78   Pulse 68   Ht 5\' 3"  (1.6 m)   Wt 131 lb (59.4 kg)   SpO2 96%   BMI 23.21 kg/m     Wt Readings from Last 3 Encounters:  11/03/20 131 lb (59.4 kg)  10/16/20 130 lb (59 kg)  09/29/20 127 lb 9.6 oz (57.9 kg)     GEN:  Well nourished, well developed in no acute distress HEENT: Normal NECK: No JVD; No  carotid bruits LYMPHATICS: No lymphadenopathy CARDIAC: RRR, no murmurs, rubs, gallops RESPIRATORY:  Clear to auscultation without rales, wheezing or rhonchi  ABDOMEN: Soft, non-tender, non-distended MUSCULOSKELETAL:  No edema; No deformity  SKIN: Warm and dry NEUROLOGIC:  Alert and oriented x 3 PSYCHIATRIC:  Normal affect   ASSESSMENT:    1. Chest pain of uncertain etiology   2. Pure hypercholesterolemia   3. Hx of CABG   4. Essential hypertension   5. Mixed hyperlipidemia    PLAN:    In order of problems listed above:  CAD s/p CABG x 4, DES x 2 to SVG-OM (11/05/19) - continue ASA, BB, lipid lowering agents - no angina - appears her epigastric pain was apendicitis  - continue risk factor modification   Hypertension - no medication changes, at goal   Hyperlipidemia with LDL goal < 70 - no statin - hx of increased LFTs on statin 11/04/2019: Cholesterol 196; HDL 35; LDL Cholesterol 83; Triglycerides 392; VLDL 78 - LDL is not at goal - she has declined other lipid lowering medications in the past - we discussed that she is not at goal and she is receptive to possible PCSK9i - will collect fasting lipids - if not at goal, can consider vascepa vs PCSK9i   Follow up in 1 year.    Medication Adjustments/Labs and Tests Ordered: Current medicines are reviewed at length with the patient today.  Concerns regarding medicines are outlined above.  Orders Placed This Encounter  Procedures  . Lipid panel   No orders of the defined types were placed in this encounter.   Signed, Ledora Bottcher, [place]  11/03/2020 12:37 PM    [facility]

## 2020-11-03 ENCOUNTER — Ambulatory Visit (INDEPENDENT_AMBULATORY_CARE_PROVIDER_SITE_OTHER): Payer: Medicare Other | Admitting: Physician Assistant

## 2020-11-03 ENCOUNTER — Other Ambulatory Visit: Payer: Self-pay

## 2020-11-03 ENCOUNTER — Encounter: Payer: Self-pay | Admitting: Physician Assistant

## 2020-11-03 VITALS — BP 130/78 | HR 68 | Ht 63.0 in | Wt 131.0 lb

## 2020-11-03 DIAGNOSIS — I10 Essential (primary) hypertension: Secondary | ICD-10-CM

## 2020-11-03 DIAGNOSIS — Z951 Presence of aortocoronary bypass graft: Secondary | ICD-10-CM | POA: Diagnosis not present

## 2020-11-03 DIAGNOSIS — E7800 Pure hypercholesterolemia, unspecified: Secondary | ICD-10-CM

## 2020-11-03 DIAGNOSIS — E782 Mixed hyperlipidemia: Secondary | ICD-10-CM | POA: Diagnosis not present

## 2020-11-03 DIAGNOSIS — R079 Chest pain, unspecified: Secondary | ICD-10-CM | POA: Diagnosis not present

## 2020-11-03 NOTE — Patient Instructions (Signed)
Medication Instructions:  No changes *If you need a refill on your cardiac medications before your next appointment, please call your pharmacy*   Lab Work: Lipid Panel If you have labs (blood work) drawn today and your tests are completely normal, you will receive your results only by: Marland Kitchen MyChart Message (if you have MyChart) OR . A paper copy in the mail If you have any lab test that is abnormal or we need to change your treatment, we will call you to review the results.   Testing/Procedures: No Testing   Follow-Up: At [facility], you and your health needs are our priority.  As part of our continuing mission to provide you with exceptional heart care, we have created designated Provider Care Teams.  These Care Teams include your primary Cardiologist (physician) and Advanced Practice Providers (APPs -  Physician Assistants and Nurse Practitioners) who all work together to provide you with the care you need, when you need it.    Your next appointment:   1 year(s)  The format for your next appointment:   In Person  Provider:   Kirk Ruths, MD

## 2020-11-05 DIAGNOSIS — I130 Hypertensive heart and chronic kidney disease with heart failure and stage 1 through stage 4 chronic kidney disease, or unspecified chronic kidney disease: Secondary | ICD-10-CM | POA: Diagnosis not present

## 2020-11-05 DIAGNOSIS — T466X5A Adverse effect of antihyperlipidemic and antiarteriosclerotic drugs, initial encounter: Secondary | ICD-10-CM | POA: Diagnosis not present

## 2020-11-05 DIAGNOSIS — I5032 Chronic diastolic (congestive) heart failure: Secondary | ICD-10-CM | POA: Diagnosis not present

## 2020-11-05 DIAGNOSIS — N183 Chronic kidney disease, stage 3 unspecified: Secondary | ICD-10-CM | POA: Diagnosis not present

## 2020-11-05 DIAGNOSIS — K719 Toxic liver disease, unspecified: Secondary | ICD-10-CM | POA: Diagnosis not present

## 2020-11-05 DIAGNOSIS — E1122 Type 2 diabetes mellitus with diabetic chronic kidney disease: Secondary | ICD-10-CM | POA: Diagnosis not present

## 2020-11-12 NOTE — Addendum Note (Signed)
Addended by: Merri Ray A on: 11/12/2020 04:18 PM   Modules accepted: Orders

## 2020-12-06 DIAGNOSIS — E1122 Type 2 diabetes mellitus with diabetic chronic kidney disease: Secondary | ICD-10-CM | POA: Diagnosis not present

## 2020-12-06 DIAGNOSIS — T466X5A Adverse effect of antihyperlipidemic and antiarteriosclerotic drugs, initial encounter: Secondary | ICD-10-CM | POA: Diagnosis not present

## 2020-12-06 DIAGNOSIS — I5032 Chronic diastolic (congestive) heart failure: Secondary | ICD-10-CM | POA: Diagnosis not present

## 2020-12-06 DIAGNOSIS — N183 Chronic kidney disease, stage 3 unspecified: Secondary | ICD-10-CM | POA: Diagnosis not present

## 2020-12-06 DIAGNOSIS — K719 Toxic liver disease, unspecified: Secondary | ICD-10-CM | POA: Diagnosis not present

## 2020-12-06 DIAGNOSIS — I130 Hypertensive heart and chronic kidney disease with heart failure and stage 1 through stage 4 chronic kidney disease, or unspecified chronic kidney disease: Secondary | ICD-10-CM | POA: Diagnosis not present

## 2021-01-05 DIAGNOSIS — I130 Hypertensive heart and chronic kidney disease with heart failure and stage 1 through stage 4 chronic kidney disease, or unspecified chronic kidney disease: Secondary | ICD-10-CM | POA: Diagnosis not present

## 2021-01-05 DIAGNOSIS — N183 Chronic kidney disease, stage 3 unspecified: Secondary | ICD-10-CM | POA: Diagnosis not present

## 2021-01-05 DIAGNOSIS — E1122 Type 2 diabetes mellitus with diabetic chronic kidney disease: Secondary | ICD-10-CM | POA: Diagnosis not present

## 2021-01-05 DIAGNOSIS — T466X5A Adverse effect of antihyperlipidemic and antiarteriosclerotic drugs, initial encounter: Secondary | ICD-10-CM | POA: Diagnosis not present

## 2021-01-05 DIAGNOSIS — K719 Toxic liver disease, unspecified: Secondary | ICD-10-CM | POA: Diagnosis not present

## 2021-01-05 DIAGNOSIS — I5032 Chronic diastolic (congestive) heart failure: Secondary | ICD-10-CM | POA: Diagnosis not present

## 2021-02-05 DIAGNOSIS — E1122 Type 2 diabetes mellitus with diabetic chronic kidney disease: Secondary | ICD-10-CM | POA: Diagnosis not present

## 2021-02-05 DIAGNOSIS — I5032 Chronic diastolic (congestive) heart failure: Secondary | ICD-10-CM | POA: Diagnosis not present

## 2021-02-05 DIAGNOSIS — I130 Hypertensive heart and chronic kidney disease with heart failure and stage 1 through stage 4 chronic kidney disease, or unspecified chronic kidney disease: Secondary | ICD-10-CM | POA: Diagnosis not present

## 2021-02-05 DIAGNOSIS — T466X5A Adverse effect of antihyperlipidemic and antiarteriosclerotic drugs, initial encounter: Secondary | ICD-10-CM | POA: Diagnosis not present

## 2021-02-05 DIAGNOSIS — N183 Chronic kidney disease, stage 3 unspecified: Secondary | ICD-10-CM | POA: Diagnosis not present

## 2021-02-25 DIAGNOSIS — K760 Fatty (change of) liver, not elsewhere classified: Secondary | ICD-10-CM | POA: Diagnosis not present

## 2021-02-25 DIAGNOSIS — E1165 Type 2 diabetes mellitus with hyperglycemia: Secondary | ICD-10-CM | POA: Diagnosis not present

## 2021-02-25 DIAGNOSIS — N183 Chronic kidney disease, stage 3 unspecified: Secondary | ICD-10-CM | POA: Diagnosis not present

## 2021-02-25 DIAGNOSIS — K219 Gastro-esophageal reflux disease without esophagitis: Secondary | ICD-10-CM | POA: Diagnosis not present

## 2021-02-25 DIAGNOSIS — E7849 Other hyperlipidemia: Secondary | ICD-10-CM | POA: Diagnosis not present

## 2021-02-25 DIAGNOSIS — R1013 Epigastric pain: Secondary | ICD-10-CM | POA: Diagnosis not present

## 2021-02-25 DIAGNOSIS — I10 Essential (primary) hypertension: Secondary | ICD-10-CM | POA: Diagnosis not present

## 2021-02-25 DIAGNOSIS — E039 Hypothyroidism, unspecified: Secondary | ICD-10-CM | POA: Diagnosis not present

## 2021-03-02 ENCOUNTER — Encounter: Payer: Self-pay | Admitting: Gastroenterology

## 2021-03-02 DIAGNOSIS — Z955 Presence of coronary angioplasty implant and graft: Secondary | ICD-10-CM | POA: Diagnosis not present

## 2021-03-02 DIAGNOSIS — Z951 Presence of aortocoronary bypass graft: Secondary | ICD-10-CM | POA: Diagnosis not present

## 2021-03-02 DIAGNOSIS — I10 Essential (primary) hypertension: Secondary | ICD-10-CM | POA: Diagnosis not present

## 2021-03-02 DIAGNOSIS — E1165 Type 2 diabetes mellitus with hyperglycemia: Secondary | ICD-10-CM | POA: Diagnosis not present

## 2021-03-02 DIAGNOSIS — I25810 Atherosclerosis of coronary artery bypass graft(s) without angina pectoris: Secondary | ICD-10-CM | POA: Diagnosis not present

## 2021-03-02 DIAGNOSIS — Z0001 Encounter for general adult medical examination with abnormal findings: Secondary | ICD-10-CM | POA: Diagnosis not present

## 2021-03-02 DIAGNOSIS — E7849 Other hyperlipidemia: Secondary | ICD-10-CM | POA: Diagnosis not present

## 2021-03-02 DIAGNOSIS — R1013 Epigastric pain: Secondary | ICD-10-CM | POA: Diagnosis not present

## 2021-03-05 DIAGNOSIS — I700 Atherosclerosis of aorta: Secondary | ICD-10-CM | POA: Diagnosis not present

## 2021-03-05 DIAGNOSIS — Z9049 Acquired absence of other specified parts of digestive tract: Secondary | ICD-10-CM | POA: Diagnosis not present

## 2021-03-05 DIAGNOSIS — R935 Abnormal findings on diagnostic imaging of other abdominal regions, including retroperitoneum: Secondary | ICD-10-CM | POA: Diagnosis not present

## 2021-03-05 DIAGNOSIS — K3189 Other diseases of stomach and duodenum: Secondary | ICD-10-CM | POA: Diagnosis not present

## 2021-03-05 DIAGNOSIS — R933 Abnormal findings on diagnostic imaging of other parts of digestive tract: Secondary | ICD-10-CM | POA: Diagnosis not present

## 2021-03-08 DIAGNOSIS — I130 Hypertensive heart and chronic kidney disease with heart failure and stage 1 through stage 4 chronic kidney disease, or unspecified chronic kidney disease: Secondary | ICD-10-CM | POA: Diagnosis not present

## 2021-03-08 DIAGNOSIS — T466X5A Adverse effect of antihyperlipidemic and antiarteriosclerotic drugs, initial encounter: Secondary | ICD-10-CM | POA: Diagnosis not present

## 2021-03-08 DIAGNOSIS — I5032 Chronic diastolic (congestive) heart failure: Secondary | ICD-10-CM | POA: Diagnosis not present

## 2021-03-08 DIAGNOSIS — N183 Chronic kidney disease, stage 3 unspecified: Secondary | ICD-10-CM | POA: Diagnosis not present

## 2021-03-08 DIAGNOSIS — E1122 Type 2 diabetes mellitus with diabetic chronic kidney disease: Secondary | ICD-10-CM | POA: Diagnosis not present

## 2021-04-06 ENCOUNTER — Encounter: Payer: Self-pay | Admitting: Gastroenterology

## 2021-04-06 ENCOUNTER — Ambulatory Visit (INDEPENDENT_AMBULATORY_CARE_PROVIDER_SITE_OTHER): Payer: Medicare Other | Admitting: Gastroenterology

## 2021-04-06 VITALS — BP 126/62 | HR 80 | Ht 63.0 in | Wt 138.0 lb

## 2021-04-06 DIAGNOSIS — R110 Nausea: Secondary | ICD-10-CM | POA: Diagnosis not present

## 2021-04-06 DIAGNOSIS — K5909 Other constipation: Secondary | ICD-10-CM

## 2021-04-06 DIAGNOSIS — R1013 Epigastric pain: Secondary | ICD-10-CM

## 2021-04-06 DIAGNOSIS — R933 Abnormal findings on diagnostic imaging of other parts of digestive tract: Secondary | ICD-10-CM

## 2021-04-06 MED ORDER — GOLYTELY 236 G PO SOLR: 4000.0000 mL | Freq: Once | ORAL | 0 refills | Status: AC

## 2021-04-06 NOTE — Patient Instructions (Signed)
If you are age 76 or older, your body mass index should be between 23-30. Your Body mass index is 24.45 kg/m. If this is out of the aforementioned range listed, please consider follow up with your Primary Care Provider.  If you are age 25 or younger, your body mass index should be between 19-25. Your Body mass index is 24.45 kg/m. If this is out of the aformentioned range listed, please consider follow up with your Primary Care Provider.   __________________________________________________________  The [facility] [place] [facility] providers would like to encourage you to use [facility] to communicate with providers for non-urgent requests or questions.  Due to long hold times on the telephone, sending your provider a message by [facility] may be a faster and more efficient way to get a response.  Please allow 48 business hours for a response.  Please remember that this is for non-urgent requests.   You have been scheduled for a colonoscopy/endoscopy. Please follow written instructions given to you at your visit today.  Please pick up your prep supplies at the pharmacy within the next 1-3 days. If you use inhalers (even only as needed), please bring them with you on the day of your procedure.  It was a pleasure to see you today!  Thank you for trusting me with your gastrointestinal care!

## 2021-04-06 NOTE — Progress Notes (Signed)
[facility] Consult Note:  History: [name] 04/06/2021  Referring provider: Curlene Labrum, MD  Reason for consult/chief complaint: Gastroesophageal Reflux (Takes omeprazole 20 mg once daily./)   Subjective  HPI: [name] was seen in the office May 2017 for chronic epigastric pain.  EGD normal, clinical suspicion was nonulcer dyspepsia, and she had previously seen Dr. Deatra [name] as well. Last screening colonoscopy with Dr. Deatra [name] July 2011, no polyps seen.  [name] was referred for epigastric pain.  It was difficult to get a clear and consistent history, and she has limited health literacy.  Also, some of her care is outside this health system. She did not initially recall seeing me in the past nor having prior upper endoscopy with me.  So it was difficult for her to say whether the previously described upper abdominal pain ever really improved.  She describes a dull constant epigastric pain for at least the last year.  She has intermittent nausea without vomiting, denies dysphagia or weight loss.  Intermittent pyrosis for which she takes omeprazole once daily.   Chronic constipation requiring Dulcolax. When asked about the bowel habits, she replied that she was given a diagnosis of "colitis" but cannot recall the details and thinks perhaps she had a colonoscopy in [place] about 2 years ago.  ROS:  Review of Systems  Constitutional:  Positive for fatigue. Negative for appetite change and unexpected weight change.  HENT:  Negative for mouth sores and voice change.   Eyes:  Negative for pain and redness.  Respiratory:  Negative for cough and shortness of breath.   Cardiovascular:  Negative for chest pain and palpitations.  Genitourinary:  Positive for frequency. Negative for dysuria and hematuria.  Musculoskeletal:  Positive for arthralgias, back pain and myalgias.  Skin:  Negative for pallor and rash.  Neurological:  Negative for weakness and headaches.   Hematological:  Negative for adenopathy.  Psychiatric/Behavioral:         Depression, which she reports is stable    Past Medical History: Past Medical History:  Diagnosis Date   Acute myocardial infarction, unspecified site, episode of care unspecified    Coronary artery disease status post CABG 2005    a. s/p CABG 2005. b. 2011 - DES of SVG to PDA. c. Admitted with CP 03/2012 with stable anatomy by cath, felt noncardiac pain.   Depression    Diabetes mellitus    Elevated LFTs    Elevated transaminase level    Most recently October 2012 most recent   Esophageal reflux    GERD (gastroesophageal reflux disease)    Hernia    History of cholecystectomy    Hyperlipidemia    a. Not on statin due to elevated liver enzymes.   Hypertension    Kidney cysts    left   From most recent cardiology office note 11/03/2020 [name], [place]): "[name] is a 75 y.o. female with a hx of CAD, HTN, DM, and HLD. She had CABG in 2005 and subsequent PCI of SVG-RCA. NSTEMI in 10/2019 led to heart cath that showed severe triple vessel CAD with 4/4 patent grafts. SVG-OM had three areas of stenosis, but good flow. CTA negative for PE. No other cause of chest pain was found. She was taken back to the cath lab for DES x 2 to proximal and mid SVG-OM. She was placed on DAPT. Echo at that time showed normal EF, grade 1 DD, mild LVH, mild AI. She has 3.1 cm dilation of  proximal descending thoracic aorta. She was last seen by Dr. Stanford [name] 03/03/20 and was doing well at that time. I saw her 09/29/20 in clinic and she complained of ongoing chest pain. I encouraged her to go the ER, but she declined. HS troponin in the office was negative.  I then ordered a nuclear stress test, but patient called and canceled bc CP resolved after she started taking omeprazole.    She presented to [facility] 10/15/20 with abdominal pain. CT imaging showed uncomplicated acute appendicitis and possible cirrhosis. Cardiology was consulted for preop  clearance. She underwent appendectomy without cardiac complications.    She returns today for follow up. Her chest/epigastric discomfort was due to appendicitis.  All discomfort has resolved since appednectomy. She is recovering, no new complaints today. We discussed her LDL goal." Patient declined lipid-lowering medication, 1 year follow-up recommended.   Past Surgical History: Past Surgical History:  Procedure Laterality Date   ABDOMINAL HYSTERECTOMY     BYPASS GRAFT  08/2003   Quadruple    CHOLECYSTECTOMY     CORONARY STENT INTERVENTION N/A 11/05/2019   Procedure: CORONARY STENT INTERVENTION;  Surgeon: Whitney Booze, MD;  Location: [place] [facility];  Service: Cardiovascular;  Laterality: N/A;   HERNIA REPAIR Right 01/2011   INTRAVASCULAR ULTRASOUND/IVUS N/A 11/05/2019   Procedure: Intravascular Ultrasound/IVUS;  Surgeon: Whitney Booze, MD;  Location: [place] [facility];  Service: Cardiovascular;  Laterality: N/A;   LAPAROSCOPIC APPENDECTOMY N/A 10/17/2020   Procedure: APPENDECTOMY LAPAROSCOPIC;  Surgeon: Whitney Keens, MD;  Location: [place];  Service: General;  Laterality: N/A;   LEFT HEART CATH AND CORS/GRAFTS ANGIOGRAPHY N/A 11/02/2019   Procedure: LEFT HEART CATH AND CORS/GRAFTS ANGIOGRAPHY;  Surgeon: Whitney Blanks, MD;  Location: [place] [facility];  Service: Cardiovascular;  Laterality: N/A;   LEFT HEART CATHETERIZATION WITH CORONARY/GRAFT ANGIOGRAM N/A 04/05/2012   Procedure: LEFT HEART CATHETERIZATION WITH Beatrix Fetters;  Surgeon: Whitney Mocha, MD;  Location: [facility];  Service: Cardiovascular;  Laterality: N/A;     Family History: Family History  Problem Relation Age of Onset   Stomach cancer Mother    Diabetes Mother    Addison's disease Sister    Heart attack Brother    Lung cancer Daughter        smoker   Colon cancer Neg Hx    Esophageal cancer Neg Hx    Pancreatic cancer Neg Hx     Social History: Social History    Socioeconomic History   Marital status: Married    Spouse name: [name]   Number of children: 2   Years of education: Not on file   Highest education level: Not on file  Occupational History   Occupation: Part-Time hair-dresser  Tobacco Use   Smoking status: Never   Smokeless tobacco: Never  Vaping Use   Vaping Use: Never used  Substance and Sexual Activity   Alcohol use: No   Drug use: No   Sexual activity: Not on file  Other Topics Concern   Not on file  Social History Narrative   Not on file   Social Determinants of Health   Financial Resource Strain: Not on file  Food Insecurity: Not on file  Transportation Needs: Not on file  Physical Activity: Not on file  Stress: Not on file  Social Connections: Not on file    Allergies: Allergies  Allergen Reactions   Aspirin Nausea Only    Outpatient Meds: Current Outpatient Medications  Medication Sig Dispense Refill  acetaminophen (TYLENOL) 325 MG tablet Take 1 tablet (325 mg total) by mouth every 6 (six) hours as needed for mild pain (or temp > 100).     amLODipine (NORVASC) 10 MG tablet Take 10 mg by mouth daily with lunch.     aspirin 81 MG chewable tablet Chew 1 tablet (81 mg total) by mouth daily. 90 tablet 1   atenolol (TENORMIN) 50 MG tablet Take 75 mg by mouth daily with lunch.     cloNIDine (CATAPRES) 0.2 MG tablet Take 0.2 mg by mouth daily. 1 Tablet Daily     ezetimibe (ZETIA) 10 MG tablet Take 1 tablet (10 mg total) by mouth daily. 90 tablet 1   furosemide (LASIX) 40 MG tablet Take 40 mg by mouth daily.     gemfibrozil (LOPID) 600 MG tablet Take 600 mg by mouth daily.      HYDROcodone-acetaminophen (NORCO) 10-325 MG tablet Take 1 tablet by mouth every 8 (eight) hours as needed for pain.     isosorbide mononitrate (IMDUR) 30 MG 24 hr tablet Take 1 tablet (30 mg total) by mouth daily. 90 tablet 1   lisinopril (ZESTRIL) 10 MG tablet Take 10 mg by mouth daily.     MAGNESIUM PO Take 0.5 tablets by mouth daily  with lunch.     omeprazole (PRILOSEC) 20 MG capsule Take 20 mg by mouth daily.     PARoxetine (PAXIL) 40 MG tablet Take 40 mg by mouth every morning.     Semaglutide (RYBELSUS) 7 MG TABS Take by mouth.     zolpidem (AMBIEN) 10 MG tablet Take 5 mg by mouth at bedtime.      nitroGLYCERIN (NITROSTAT) 0.4 MG SL tablet Place 1 tablet (0.4 mg total) under the tongue every 5 (five) minutes as needed for chest pain. 25 tablet 3   No current facility-administered medications for this visit.      ___________________________________________________________________ Objective   Exam:  BP 126/62   Pulse 80   Ht '5\' 3"'$  (1.6 m)   Wt 138 lb (62.6 kg)   BMI 24.45 kg/m  Wt Readings from Last 3 Encounters:  04/06/21 138 lb (62.6 kg)  11/03/20 131 lb (59.4 kg)  10/16/20 130 lb (59 kg)    General: Well-appearing, no muscle wasting Eyes: sclera anicteric, no redness ENT: oral mucosa moist without lesions, no cervical or supraclavicular lymphadenopathy CV: RRR without murmur, S1/S2, no JVD, no peripheral edema Resp: clear to auscultation bilaterally, normal RR and effort noted GI: soft, mild epigastric tenderness, with active bowel sounds. No guarding or palpable organomegaly noted.  No hernia appreciated (patient reports she had mesh placed in this area years ago) Skin; warm and dry, no rash or jaundice noted Neuro: awake, alert and oriented x 3. Normal gross motor function and fluent speech  Labs:  CBC Latest Ref Rng & Units 10/16/2020 10/15/2020 11/06/2019  WBC 4.0 - 10.5 K/uL 5.1 7.3 5.7  Hemoglobin 12.0 - 15.0 g/dL 11.4(L) 13.1 12.7  Hematocrit 36.0 - 46.0 % 31.8(L) 37.5 35.9(L)  Platelets 150 - 400 K/uL 216 318 249   CMP Latest Ref Rng & Units 10/16/2020 10/15/2020 11/07/2019  Glucose 70 - 99 mg/dL 122(H) 124(H) 317(H)  BUN 8 - 23 mg/dL '9 10 15  '$ Creatinine 0.44 - 1.00 mg/dL 0.76 0.95 0.68  Sodium 135 - 145 mmol/L 141 141 131(L)  Potassium 3.5 - 5.1 mmol/L 3.4(L) 3.3(L) 3.7  Chloride 98 - 111  mmol/L 108 104 100  CO2 22 - 32 mmol/L 28  30 22  Calcium 8.9 - 10.3 mg/dL 9.2 9.6 9.0  Total Protein 6.5 - 8.1 g/dL - 6.9 6.6  Total Bilirubin 0.3 - 1.2 mg/dL - 1.1 1.1  Alkaline Phos 38 - 126 U/L - 73 75  AST 15 - 41 U/L - 17 30  ALT 0 - 44 U/L - 17 42     Radiologic Studies:  03/05/2021 CT abdomen and pelvis without oral or IV contrast ordered by PCP and performed at outside [facility] (report accompanied referral)  Aortic atherosclerosis  "Wall thickening of the distal stomach is noted concerning for possible inflammation.  There is no evidence of bowel obstruction.  Wall thickening is seen involving the cecum which may represent infectious or inflammatory colitis.  Stool is noted throughout the colon.  Probable linear foreign body is noted in the proximal sigmoid colon.  "  Status postcholecystectomy and hysterectomy, otherwise unremarkable exam.  Assessment: Encounter Diagnoses  Name Primary?   Epigastric pain Yes   Chronic constipation    Abnormal finding on GI tract imaging    Nausea in adult     Vague upper abdominal discomfort, difficult to characterize with associated nausea.  Chronic constipation for many years. She was concerned about the reported findings on CT scan, which she understood is a "hardening" of part of the stomach. I explained the limitations with lack of IV and oral contrast and the possibility of this being only apparent wall thickening from under distention, either in the stomach or colon or both.  Nevertheless, she has unexplained upper abdominal pain.  In the past was felt to be functional in nature but that work-up was many years ago and we now have a CT scan suggesting abnormality in the stomach  Plan:  Upper endoscopy and colonoscopy.  (To evaluate upper abdominal pain and the reported CT scan abnormalities).  She was agreeable after discussion of procedure and risks.  The benefits and risks of the planned procedure were described in detail  with the patient or (when appropriate) their health care proxy.  Risks were outlined as including, but not limited to, bleeding, infection, perforation, adverse medication reaction leading to cardiac or pulmonary decompensation, pancreatitis (if ERCP).  The limitation of incomplete mucosal visualization was also discussed.  No guarantees or warranties were given.   Thank you for the courtesy of this consult.  Please call me with any questions or concerns.  Nelida Meuse III  CC: Referring provider noted above

## 2021-04-08 DIAGNOSIS — I5032 Chronic diastolic (congestive) heart failure: Secondary | ICD-10-CM | POA: Diagnosis not present

## 2021-04-08 DIAGNOSIS — T466X5A Adverse effect of antihyperlipidemic and antiarteriosclerotic drugs, initial encounter: Secondary | ICD-10-CM | POA: Diagnosis not present

## 2021-04-08 DIAGNOSIS — I130 Hypertensive heart and chronic kidney disease with heart failure and stage 1 through stage 4 chronic kidney disease, or unspecified chronic kidney disease: Secondary | ICD-10-CM | POA: Diagnosis not present

## 2021-04-08 DIAGNOSIS — N183 Chronic kidney disease, stage 3 unspecified: Secondary | ICD-10-CM | POA: Diagnosis not present

## 2021-04-08 DIAGNOSIS — E1122 Type 2 diabetes mellitus with diabetic chronic kidney disease: Secondary | ICD-10-CM | POA: Diagnosis not present

## 2021-04-28 ENCOUNTER — Other Ambulatory Visit: Payer: Self-pay

## 2021-04-28 ENCOUNTER — Ambulatory Visit (AMBULATORY_SURGERY_CENTER): Payer: Medicare Other | Admitting: Gastroenterology

## 2021-04-28 ENCOUNTER — Encounter: Payer: Self-pay | Admitting: Gastroenterology

## 2021-04-28 VITALS — BP 160/75 | HR 66 | Temp 95.7°F | Resp 12 | Ht 63.0 in | Wt 138.0 lb

## 2021-04-28 DIAGNOSIS — R933 Abnormal findings on diagnostic imaging of other parts of digestive tract: Secondary | ICD-10-CM | POA: Diagnosis not present

## 2021-04-28 DIAGNOSIS — K225 Diverticulum of esophagus, acquired: Secondary | ICD-10-CM

## 2021-04-28 DIAGNOSIS — R1013 Epigastric pain: Secondary | ICD-10-CM

## 2021-04-28 DIAGNOSIS — D122 Benign neoplasm of ascending colon: Secondary | ICD-10-CM

## 2021-04-28 DIAGNOSIS — K2950 Unspecified chronic gastritis without bleeding: Secondary | ICD-10-CM | POA: Diagnosis not present

## 2021-04-28 DIAGNOSIS — K2970 Gastritis, unspecified, without bleeding: Secondary | ICD-10-CM

## 2021-04-28 MED ORDER — SODIUM CHLORIDE 0.9 % IV SOLN: 500.0000 mL | Freq: Once | INTRAVENOUS | Status: DC

## 2021-04-28 NOTE — Progress Notes (Signed)
To PACU, VSS. Report to [email or web address] 

## 2021-04-28 NOTE — Progress Notes (Signed)
Called to room to assist during endoscopic procedure.  Patient ID and intended procedure confirmed with present staff. Received instructions for my participation in the procedure from the performing physician.  

## 2021-04-28 NOTE — Patient Instructions (Signed)
YOU HAD AN ENDOSCOPIC PROCEDURE TODAY AT [facility]:   Refer to the procedure report that was given to you for any specific questions about what was found during the examination.  If the procedure report does not answer your questions, please call your gastroenterologist to clarify.  If you requested that your care partner not be given the details of your procedure findings, then the procedure report has been included in a sealed envelope for you to review at your convenience later.  YOU SHOULD EXPECT: Some feelings of bloating in the abdomen. Passage of more gas than usual.  Walking can help get rid of the air that was put into your GI tract during the procedure and reduce the bloating. If you had a lower endoscopy (such as a colonoscopy or flexible sigmoidoscopy) you may notice spotting of blood in your stool or on the toilet paper. If you underwent a bowel prep for your procedure, you may not have a normal bowel movement for a few days.  Please Note:  You might notice some irritation and congestion in your nose or some drainage.  This is from the oxygen used during your procedure.  There is no need for concern and it should clear up in a day or so.  SYMPTOMS TO REPORT IMMEDIATELY:  Following lower endoscopy (colonoscopy or flexible sigmoidoscopy):  Excessive amounts of blood in the stool  Significant tenderness or worsening of abdominal pains  Swelling of the abdomen that is new, acute  Fever of 100F or higher  Following upper endoscopy (EGD)  Vomiting of blood or coffee ground material  New chest pain or pain under the shoulder blades  Painful or persistently difficult swallowing  New shortness of breath  Fever of 100F or higher  Black, tarry-looking stools  For urgent or emergent issues, a gastroenterologist can be reached at any hour by calling [phone]. Do not use MyChart messaging for urgent concerns.    DIET:  We do recommend a small meal at first, but  then you may proceed to your regular diet.  Drink plenty of fluids but you should avoid alcoholic beverages for 24 hours.  ACTIVITY:  You should plan to take it easy for the rest of today and you should NOT DRIVE or use heavy machinery until tomorrow (because of the sedation medicines used during the test).    FOLLOW UP: Our staff will call the number listed on your records 48-72 hours following your procedure to check on you and address any questions or concerns that you may have regarding the information given to you following your procedure. If we do not reach you, we will leave a message.  We will attempt to reach you two times.  During this call, we will ask if you have developed any symptoms of COVID 19. If you develop any symptoms (ie: fever, flu-like symptoms, shortness of breath, cough etc.) before then, please call [phone].  If you test positive for Covid 19 in the 2 weeks post procedure, please call and report this information to [place].    If any biopsies were taken you will be contacted by phone or by letter within the next 1-3 weeks.  Please call us at [phone] if you have not heard about the biopsies in 3 weeks.    SIGNATURES/CONFIDENTIALITY: You and/or your care partner have signed paperwork which will be entered into your electronic medical record.  These signatures attest to the fact that that the information above on your After  Visit Summary has been reviewed and is understood.  Full responsibility of the confidentiality of this discharge information lies with you and/or your care-partner.    Resume medications. Information given on polyps.

## 2021-04-28 NOTE — Progress Notes (Signed)
VS taken by [place] 

## 2021-04-28 NOTE — Op Note (Signed)
[facility] Patient Name: [name] Procedure Date: 04/28/2021 2:12 PM MRN: [id number] Endoscopist: Mallie Mussel L. Loletha Carrow , MD Age: 75 Referring MD:  Date of Birth: [birth year] Gender: Female Account #: [id number] Procedure:                Colonoscopy Indications:              Abnormal CT of the GI tract                           [facility] CTAP suggesting cecal wall thickening Medicines:                Monitored Anesthesia Care Procedure:                Pre-Anesthesia Assessment:                           - Prior to the procedure, a History and Physical                            was performed, and patient medications and                            allergies were reviewed. The patient's tolerance of                            previous anesthesia was also reviewed. The risks                            and benefits of the procedure and the sedation                            options and risks were discussed with the patient.                            All questions were answered, and informed consent                            was obtained. Prior Anticoagulants: The patient has                            taken no previous anticoagulant or antiplatelet                            agents. ASA Grade Assessment: III - A patient with                            severe systemic disease. After reviewing the risks                            and benefits, the patient was deemed in                            satisfactory condition to undergo the procedure.  After obtaining informed consent, the colonoscope                            was passed under direct vision. Throughout the                            procedure, the patient's blood pressure, pulse, and                            oxygen saturations were monitored continuously. The                            PCF-HQ190L Colonoscope was introduced through the                            anus and advanced to the the cecum,  identified by                            appendiceal orifice and ileocecal valve. The                            colonoscopy was somewhat difficult due to a                            redundant colon and significant looping. Successful                            completion of the procedure was aided by using                            manual pressure and straightening and shortening                            the scope to obtain bowel loop reduction. The                            patient tolerated the procedure well. The quality                            of the bowel preparation was fair (lavage                            performed). The ileocecal valve, appendiceal                            orifice, and rectum were photographed. The bowel                            preparation used was Ducolax plus GoLYTELY via                            split dose instruction. Scope In: 2:20:41 PM Scope Out: 2:46:50 PM Scope Withdrawal Time: 0 hours 20 minutes 32 seconds  Total Procedure Duration: 0  hours 26 minutes 9 seconds  Findings:                 The digital rectal exam findings include decreased                            sphincter tone.                           A 30-35 x 10 mm polyp was found in the proximal                            ascending colon. The polyp was flat and draped over                            a fold. Biopsies were taken with a cold forceps for                            histology. Area was tattooed with an injection of                            0.5 mL of Spot (carbon black).                           The exam was otherwise without abnormality on                            direct and retroflexion views. Complications:            No immediate complications. Estimated Blood Loss:     Estimated blood loss was minimal. Impression:               - Preparation of the colon was fair.                           - Decreased sphincter tone found on digital rectal                             exam.                           - One 30-35 mm polyp in the proximal ascending                            colon. Biopsied. Tattooed.                           - The examination was otherwise normal on direct                            and retroflexion views. Recommendation:           - Patient has a contact number available for                            emergencies. The signs and symptoms of potential  delayed complications were discussed with the                            patient. Return to normal activities tomorrow.                            Written discharge instructions were provided to the                            patient.                           - Resume previous diet.                           - Continue present medications.                           - Await pathology results.                           - Repeat colonoscopy date to be determined after                            pending pathology results are reviewed for EMR                            polyp. Consume more water with bowel prep for next                            exam.                           - See the other procedure note for documentation of                            additional recommendations. Haifa Hatton L. Loletha Carrow, MD 04/28/2021 3:06:50 PM This report has been signed electronically.

## 2021-04-28 NOTE — Op Note (Signed)
[facility] Patient Name: [name] Procedure Date: 04/28/2021 2:12 PM MRN: [id number] Endoscopist: Mallie Mussel L. Loletha Carrow , MD Age: 75 Referring MD:  Date of Birth: 1946-07-16 Gender: Female Account #: [id number] Procedure:                Upper GI endoscopy Indications:              Epigastric abdominal pain, Abnormal CT of the GI                            tract ([place] CTAP suggesting thickening distal gastric                            wall)                           Prior w/u for epigastric pain 2017 Medicines:                Monitored Anesthesia Care Procedure:                Pre-Anesthesia Assessment:                           - Prior to the procedure, a History and Physical                            was performed, and patient medications and                            allergies were reviewed. The patient's tolerance of                            previous anesthesia was also reviewed. The risks                            and benefits of the procedure and the sedation                            options and risks were discussed with the patient.                            All questions were answered, and informed consent                            was obtained. Prior Anticoagulants: The patient has                            taken no previous anticoagulant or antiplatelet                            agents. ASA Grade Assessment: III - A patient with                            severe systemic disease. After reviewing the risks  and benefits, the patient was deemed in                            satisfactory condition to undergo the procedure.                           After obtaining informed consent, the endoscope was                            passed under direct vision. Throughout the                            procedure, the patient's blood pressure, pulse, and                            oxygen saturations were monitored continuously. The                             GIF [id number] #[id number] was introduced through the                            mouth, and advanced to the second part of duodenum.                            The upper GI endoscopy was accomplished without                            difficulty. The patient tolerated the procedure                            well. Scope In: Scope Out: Findings:                 The esophagus was normal.                           Patchy atrophic mucosa was found in the gastric                            antrum. Biopsies were taken with a cold forceps for                            histology (antrum and body).                           The exam of the stomach was otherwise normal.                           The cardia and gastric fundus were normal on                            retroflexion.                           A large diverticulum was found in the second  portion of the duodenum.                           The exam of the duodenum was otherwise normal. Complications:            No immediate complications. Estimated Blood Loss:     Estimated blood loss was minimal. Impression:               - Normal esophagus.                           - Gastric mucosal atrophy. Biopsied.                           - Duodenal diverticulum.                           Imaging artifact - no correlative endoscopic                            findings. Recommendation:           - Patient has a contact number available for                            emergencies. The signs and symptoms of potential                            delayed complications were discussed with the                            patient. Return to normal activities tomorrow.                            Written discharge instructions were provided to the                            patient.                           - Resume previous diet.                           - Continue present medications.                           - Await  pathology results.                           - See the other procedure note for documentation of                            additional recommendations. Tyland Klemens L. Loletha Carrow, MD 04/28/2021 3:11:46 PM This report has been signed electronically.

## 2021-04-28 NOTE — Progress Notes (Signed)
No changes to clinical history since GI office visit on 04/06/21.  No chest pain or dyspnea today.  The patient is appropriate for an endoscopic procedure in the ambulatory setting.

## 2021-04-30 ENCOUNTER — Telehealth: Payer: Self-pay

## 2021-04-30 NOTE — Telephone Encounter (Signed)
  Follow up Call-  Call back number 04/28/2021  Post procedure Call Back phone  # [phone]  Permission to leave phone message Yes  Some recent data might be hidden     Patient questions:  Do you have a fever, pain , or abdominal swelling? No. Pain Score  0 *  Have you tolerated food without any problems? Yes.    Have you been able to return to your normal activities? Yes.    Do you have any questions about your discharge instructions: Diet   No. Medications  No. Follow up visit  No.  Do you have questions or concerns about your Care? No.  Actions: * If pain score is 4 or above: No action needed, pain <4.  Have you developed a fever since your procedure? no  2.   Have you had an respiratory symptoms (SOB or cough) since your procedure? no  3.   Have you tested positive for COVID 19 since your procedure no  4.   Have you had any family members/close contacts diagnosed with the COVID 19 since your procedure?  no   If yes to any of these questions please route to Joylene John, RN and Joella Prince, RN

## 2021-05-12 ENCOUNTER — Other Ambulatory Visit: Payer: Self-pay

## 2021-05-12 DIAGNOSIS — R933 Abnormal findings on diagnostic imaging of other parts of digestive tract: Secondary | ICD-10-CM

## 2021-05-12 DIAGNOSIS — D122 Benign neoplasm of ascending colon: Secondary | ICD-10-CM

## 2021-06-11 ENCOUNTER — Other Ambulatory Visit: Payer: Self-pay

## 2021-06-11 ENCOUNTER — Ambulatory Visit (AMBULATORY_SURGERY_CENTER): Payer: Self-pay | Admitting: *Deleted

## 2021-06-11 VITALS — Ht 63.0 in | Wt 139.0 lb

## 2021-06-11 DIAGNOSIS — Z86010 Personal history of colonic polyps: Secondary | ICD-10-CM

## 2021-06-11 MED ORDER — PEG 3350-KCL-NA BICARB-NACL 420 G PO SOLR: 4000.0000 mL | Freq: Once | ORAL | 0 refills | Status: AC

## 2021-06-11 NOTE — Progress Notes (Signed)
Pre visit completed in person.  No egg or soy allergy known to patient  No issues known to pt with past sedation with any surgeries or procedures Patient denies ever being told they had issues or difficulty with intubation  No FH of Malignant Hyperthermia Pt is not on diet pills Pt is not on  home 02  Pt is not on blood thinners  Pt denies issues with constipation  ( mild) patient will take daily dulcolax as needed the days before colonoscopy to remain regular.  No A fib or A flutter  Pt is fully vaccinated  for Covid    Due to the COVID-19 pandemic we are asking patients to follow certain guidelines in [place] and the [place]   Pt aware of COVID protocols and [facility] guidelines

## 2021-06-15 ENCOUNTER — Encounter (HOSPITAL_COMMUNITY): Payer: Self-pay | Admitting: Gastroenterology

## 2021-06-22 ENCOUNTER — Telehealth: Payer: Self-pay

## 2021-06-22 NOTE — Telephone Encounter (Signed)
Called patient to confirm her colonoscopy appt at [facility] on Thursday, 06/25/21 at 7:30 am. Patient is aware that she will need to arrive at [facility] by 6 am with a care partner. Patient states that she does have her instructions available. Patient verbalized understanding of all information and had no concerns at the end of the call.

## 2021-06-24 NOTE — Anesthesia Preprocedure Evaluation (Addendum)
Anesthesia Evaluation  Patient identified by MRN, date of birth, ID band Patient awake    Reviewed: Allergy & Precautions, NPO status , Patient's Chart, lab work & pertinent test results  Airway Mallampati: II  TM Distance: >3 FB Neck ROM: Full    Dental  (+) Edentulous Upper, Edentulous Lower   Pulmonary COPD,    Pulmonary exam normal breath sounds clear to auscultation       Cardiovascular hypertension, + CAD, + Past MI, + CABG and +CHF  Normal cardiovascular exam Rhythm:Regular Rate:Normal  ECHO 10/2019 1. Left ventricular ejection fraction, by estimation, is 65 to 70%. The  left ventricle has normal function. The left ventricle has no regional  wall motion abnormalities. There is mild asymmetric left ventricular  hypertrophy. Left ventricular diastolic  parameters are consistent with Grade I diastolic dysfunction (impaired  relaxation).  2. Right ventricular systolic function is normal. The right ventricular  size is normal. There is normal pulmonary artery systolic pressure.  3. Left atrial size was mildly dilated.  4. The mitral valve is normal in structure. No evidence of mitral valve  regurgitation. No evidence of mitral stenosis.  5. The aortic valve is normal in structure. Aortic valve regurgitation is  mild. No aortic stenosis is present.    Neuro/Psych PSYCHIATRIC DISORDERS Depression negative neurological ROS     GI/Hepatic GERD  ,  Endo/Other  diabetes, Poorly Controlled, Type 2hyperlipidemia  Renal/GU      Musculoskeletal  (+) Arthritis , Osteoarthritis,    Abdominal   Peds  Hematology negative hematology ROS (+)   Anesthesia Other Findings   Reproductive/Obstetrics                            Anesthesia Physical Anesthesia Plan  ASA: 3  Anesthesia Plan: MAC   Post-op Pain Management:    Induction: Intravenous  PONV Risk Score and Plan: 2 and Propofol  infusion and Treatment may vary due to age or medical condition  Airway Management Planned: Natural Airway and Simple Face Mask  Additional Equipment: None  Intra-op Plan:   Post-operative Plan: Extubation in OR  Informed Consent: I have reviewed the patients History and Physical, chart, labs and discussed the procedure including the risks, benefits and alternatives for the proposed anesthesia with the patient or authorized representative who has indicated his/her understanding and acceptance.     Dental advisory given  Plan Discussed with: CRNA, Anesthesiologist and Surgeon  Anesthesia Plan Comments:        Anesthesia Quick Evaluation

## 2021-06-25 ENCOUNTER — Ambulatory Visit (HOSPITAL_COMMUNITY)
Admission: RE | Admit: 2021-06-25 | Discharge: 2021-06-25 | Disposition: A | Discharge status: Home / Self Care | Payer: Medicare Other | Source: Ambulatory Visit | Attending: Gastroenterology | Admitting: Gastroenterology

## 2021-06-25 ENCOUNTER — Ambulatory Visit (HOSPITAL_COMMUNITY): Payer: Medicare Other | Admitting: Anesthesiology

## 2021-06-25 ENCOUNTER — Encounter (HOSPITAL_COMMUNITY)
Admission: RE | Disposition: A | Discharge status: Home / Self Care | Payer: Self-pay | Source: Ambulatory Visit | Attending: Gastroenterology

## 2021-06-25 ENCOUNTER — Encounter (HOSPITAL_COMMUNITY): Payer: Self-pay | Admitting: Gastroenterology

## 2021-06-25 ENCOUNTER — Other Ambulatory Visit: Payer: Self-pay

## 2021-06-25 DIAGNOSIS — K760 Fatty (change of) liver, not elsewhere classified: Secondary | ICD-10-CM | POA: Diagnosis not present

## 2021-06-25 DIAGNOSIS — K635 Polyp of colon: Secondary | ICD-10-CM | POA: Diagnosis not present

## 2021-06-25 DIAGNOSIS — R933 Abnormal findings on diagnostic imaging of other parts of digestive tract: Secondary | ICD-10-CM

## 2021-06-25 DIAGNOSIS — I252 Old myocardial infarction: Secondary | ICD-10-CM | POA: Diagnosis not present

## 2021-06-25 DIAGNOSIS — E1165 Type 2 diabetes mellitus with hyperglycemia: Secondary | ICD-10-CM | POA: Diagnosis not present

## 2021-06-25 DIAGNOSIS — E785 Hyperlipidemia, unspecified: Secondary | ICD-10-CM | POA: Diagnosis not present

## 2021-06-25 DIAGNOSIS — J439 Emphysema, unspecified: Secondary | ICD-10-CM | POA: Diagnosis not present

## 2021-06-25 DIAGNOSIS — Z951 Presence of aortocoronary bypass graft: Secondary | ICD-10-CM | POA: Insufficient documentation

## 2021-06-25 DIAGNOSIS — D122 Benign neoplasm of ascending colon: Secondary | ICD-10-CM | POA: Diagnosis not present

## 2021-06-25 DIAGNOSIS — I2510 Atherosclerotic heart disease of native coronary artery without angina pectoris: Secondary | ICD-10-CM | POA: Diagnosis not present

## 2021-06-25 DIAGNOSIS — I110 Hypertensive heart disease with heart failure: Secondary | ICD-10-CM | POA: Diagnosis not present

## 2021-06-25 DIAGNOSIS — I509 Heart failure, unspecified: Secondary | ICD-10-CM | POA: Insufficient documentation

## 2021-06-25 DIAGNOSIS — J449 Chronic obstructive pulmonary disease, unspecified: Secondary | ICD-10-CM | POA: Diagnosis not present

## 2021-06-25 HISTORY — PX: HEMOSTASIS CLIP PLACEMENT: SHX6857

## 2021-06-25 HISTORY — PX: ENDOSCOPIC MUCOSAL RESECTION: SHX6839

## 2021-06-25 HISTORY — PX: SUBMUCOSAL INJECTION: SHX5543

## 2021-06-25 HISTORY — PX: COLONOSCOPY WITH PROPOFOL: SHX5780

## 2021-06-25 LAB — GLUCOSE, CAPILLARY: Glucose-Capillary: 171 mg/dL — ABNORMAL HIGH (ref 70–99)

## 2021-06-25 SURGERY — COLONOSCOPY WITH PROPOFOL
Anesthesia: Monitor Anesthesia Care

## 2021-06-25 MED ORDER — PROPOFOL 10 MG/ML IV BOLUS
INTRAVENOUS | Status: DC | PRN
  Administered 2021-06-25 (×2): 20 mg via INTRAVENOUS
  Administered 2021-06-25: 40 mg via INTRAVENOUS

## 2021-06-25 MED ORDER — PROPOFOL 500 MG/50ML IV EMUL
INTRAVENOUS | Status: DC | PRN
  Administered 2021-06-25: 100 ug/kg/min via INTRAVENOUS

## 2021-06-25 MED ORDER — LACTATED RINGERS IV SOLN: INTRAVENOUS | Status: DC | PRN

## 2021-06-25 MED ORDER — PROPOFOL 1000 MG/100ML IV EMUL
INTRAVENOUS | Status: AC
  Filled 2021-06-25: qty 100

## 2021-06-25 MED ORDER — PHENYLEPHRINE 40 MCG/ML (10ML) SYRINGE FOR IV PUSH (FOR BLOOD PRESSURE SUPPORT)
PREFILLED_SYRINGE | INTRAVENOUS | Status: DC | PRN
  Administered 2021-06-25 (×3): 80 ug via INTRAVENOUS
  Administered 2021-06-25: 120 ug via INTRAVENOUS
  Administered 2021-06-25: 40 ug via INTRAVENOUS

## 2021-06-25 MED ORDER — SODIUM CHLORIDE 0.9 % IV SOLN: INTRAVENOUS | Status: DC

## 2021-06-25 SURGICAL SUPPLY — 22 items

## 2021-06-25 NOTE — Anesthesia Procedure Notes (Signed)
Procedure Name: MAC Date/Time: 06/25/2021 7:39 AM Performed by: Deliah Boston, CRNA Pre-anesthesia Checklist: Patient identified, Emergency Drugs available, Suction available and Patient being monitored Patient Re-evaluated:Patient Re-evaluated prior to induction Oxygen Delivery Method: Simple face mask Preoxygenation: Pre-oxygenation with 100% oxygen Placement Confirmation: positive ETCO2

## 2021-06-25 NOTE — Interval H&P Note (Signed)
History and Physical Interval Note:  06/25/2021 7:35 AM  [name]  has presented today for surgery, with the diagnosis of 30-86mm precancerous polyp in the proximal ascending colon.  The various methods of treatment have been discussed with the patient and family. After consideration of risks, benefits and other options for treatment, the patient has consented to  Procedure(s): COLONOSCOPY WITH PROPOFOL (N/A) ENDOSCOPIC MUCOSAL RESECTION (N/A) as a surgical intervention.  The patient's history has been reviewed, patient examined, no change in status, stable for surgery.  I have reviewed the patient's chart and labs.  Questions were answered to the patient's satisfaction.     Nelida Meuse III

## 2021-06-25 NOTE — H&P (Signed)
History and Physical:  This patient presents for endoscopic testing for: Adenomatous colon polyp  [name] was seen in the office several months ago for chronic abdominal pain.  She also had abnormal findings on the CT scan with questionable abnormality in the TI or proximal right colon.  Clinical details in that office note. Upper endoscopy and colonoscopy done 04/28/2021.  Large flat proximal ascending colon adenomatous polyp discovered.  Patient is here today for colonoscopy and EMR of this polyp. She continues to have chronic abdominal bloating and crampy abdominal discomfort in the setting of chronic constipation.   ROS: Patient denies chest pain or cough   Past Medical History: Past Medical History:  Diagnosis Date   Acute myocardial infarction, unspecified site, episode of care unspecified    Arthritis    CHF (congestive heart failure) (HCC)    COPD (chronic obstructive pulmonary disease) ([place])    Coronary artery disease status post CABG 2005    a. s/p CABG 2005. b. 2011 - DES of SVG to PDA. c. Admitted with CP 03/2012 with stable anatomy by cath, felt noncardiac pain.   Depression    Diabetes mellitus    Elevated LFTs    Elevated transaminase level    Most recently October 2012 most recent   Emphysema of lung ([place])    Esophageal reflux    GERD (gastroesophageal reflux disease)    Heart murmur    Hernia    History of cholecystectomy    Hyperlipidemia    a. Not on statin due to elevated liver enzymes.   Hypertension    Kidney cysts    left     Past Surgical History: Past Surgical History:  Procedure Laterality Date   ABDOMINAL HYSTERECTOMY     BYPASS GRAFT  08/2003   Quadruple    CHOLECYSTECTOMY     CORONARY STENT INTERVENTION N/A 11/05/2019   Procedure: CORONARY STENT INTERVENTION;  Surgeon: Jettie Booze, MD;  Location: [facility];  Service: Cardiovascular;  Laterality: N/A;   HERNIA REPAIR Right 01/2011   INTRAVASCULAR ULTRASOUND/IVUS N/A 11/05/2019    Procedure: Intravascular Ultrasound/IVUS;  Surgeon: Jettie Booze, MD;  Location: [facility];  Service: Cardiovascular;  Laterality: N/A;   LAPAROSCOPIC APPENDECTOMY N/A 10/17/2020   Procedure: APPENDECTOMY LAPAROSCOPIC;  Surgeon: Coralie Keens, MD;  Location: [facility];  Service: General;  Laterality: N/A;   LEFT HEART CATH AND CORS/GRAFTS ANGIOGRAPHY N/A 11/02/2019   Procedure: LEFT HEART CATH AND CORS/GRAFTS ANGIOGRAPHY;  Surgeon: Burnell Blanks, MD;  Location: [facility];  Service: Cardiovascular;  Laterality: N/A;   LEFT HEART CATHETERIZATION WITH CORONARY/GRAFT ANGIOGRAM N/A 04/05/2012   Procedure: LEFT HEART CATHETERIZATION WITH Beatrix Fetters;  Surgeon: Sherren Mocha, MD;  Location[facility];  Service: Cardiovascular;  Laterality: N/A;    Allergies: Allergies  Allergen Reactions   Aspirin Nausea Only    Outpatient Meds: Current Facility-Administered Medications  Medication Dose Route Frequency Provider Last Rate Last Admin   0.9 %  sodium chloride infusion   Intravenous Continuous Danis, Estill Cotta III, MD          ___________________________________________________________________ Objective   Exam:  BP (!) 161/68   Pulse 75   Temp 98.4 F (36.9 C) (Oral)   Resp 10   Ht 5\' 3"  (1.6 m)   Wt 62.6 kg   SpO2 100%   BMI 24.45 kg/m   CV: RRR without murmur, S1/S2 Resp: clear to auscultation bilaterally, normal RR and effort noted GI: soft,  no tenderness, with active bowel sounds.   Assessment: Adenomatous colon polyp   Plan: Colonoscopy with EMR   The patient is appropriate for an endoscopic procedure in the ambulatory setting.   - Wilfrid Lund, MD

## 2021-06-25 NOTE — Transfer of Care (Signed)
Immediate Anesthesia Transfer of Care Note  Patient: SAPHIA VANDERFORD  Procedure(s) Performed: Procedure(s): COLONOSCOPY WITH PROPOFOL (N/A) ENDOSCOPIC MUCOSAL RESECTION (N/A)  Patient Location: PACU  Anesthesia Type:MAC  Level of Consciousness: Patient easily awoken, sedated, comfortable, cooperative, following commands, responds to stimulation.   Airway & Oxygen Therapy: Patient spontaneously breathing, ventilating well, oxygen via simple oxygen mask.  Post-op Assessment: Report given to PACU RN, vital signs reviewed and stable, moving all extremities.   Post vital signs: Reviewed and stable.  Complications: No apparent anesthesia complications  Last Vitals:  Vitals Value Taken Time  BP 165/70 06/25/21 0840  Temp    Pulse 69 06/25/21 0841  Resp 10 06/25/21 0841  SpO2 100 % 06/25/21 0841  Vitals shown include unvalidated device data.  Last Pain:  Vitals:   06/25/21 0719  TempSrc: Oral  PainSc: 7          Complications: No notable events documented.

## 2021-06-25 NOTE — Op Note (Signed)
[facility] Patient Name: [name] Procedure Date: 06/25/2021 MRN: [id number] Attending MD: Estill Cotta. Loletha Carrow , MD Date of Birth: Dec 18, 1945 CSN: [id number] Age: 75 Admit Type: Outpatient Procedure:                Colonoscopy Indications:              For therapy of adenomatous polyps in the colon                           (large flat tubular adenoma in ascending colon                            discovered Sep 2022 Providers:                Estill Cotta. Loletha Carrow, MD, Particia Nearing, RN, Benetta Spar, Technician Referring MD:              Medicines:                Monitored Anesthesia Care Complications:            No immediate complications. Estimated Blood Loss:     Estimated blood loss was minimal. Procedure:                Pre-Anesthesia Assessment:                           - Prior to the procedure, a History and Physical                            was performed, and patient medications and                            allergies were reviewed. The patient's tolerance of                            previous anesthesia was also reviewed. The risks                            and benefits of the procedure and the sedation                            options and risks were discussed with the patient.                            All questions were answered, and informed consent                            was obtained. Prior Anticoagulants: The patient has                            taken no previous anticoagulant or antiplatelet                            agents.  ASA Grade Assessment: III - A patient with                            severe systemic disease. After reviewing the risks                            and benefits, the patient was deemed in                            satisfactory condition to undergo the procedure.                           After obtaining informed consent, the colonoscope                            was passed under direct  vision. Throughout the                            procedure, the patient's blood pressure, pulse, and                            oxygen saturations were monitored continuously. The                            CF-HQ190L ([id number] Olympus colonoscope was                            introduced through the anus and advanced to the the                            cecum, identified by appendiceal orifice and                            ileocecal valve. The colonoscopy was performed                            without difficulty. The patient tolerated the                            procedure well. The quality of the bowel                            preparation was good. The ileocecal valve,                            appendiceal orifice, and rectum were photographed.                            The bowel preparation used was GoLYTELY. Scope In: 7:45:12 AM Scope Out: 8:33:52 AM Scope Withdrawal Time: 0 hours 43 minutes 59 seconds  Total Procedure Duration: 0 hours 48 minutes 40 seconds  Findings:      The digital rectal exam findings include decreased sphincter tone.      The sigmoid colon was redundant.  A 30 x 15 mm polyp was found in the proximal ascending colon. The polyp       was flat. NBI imaging and Orise injection used to demarcate polyp       borders. Preparations were made for mucosal resection. 8 mL of Orise gel       was injected with adequate lift of the lesion from the muscularis       propria. Piecemeal mucosal resection using a hot and cold snare was       performed. Resection and retrieval were complete. To prevent bleeding       post-intervention, three hemostatic clips were successfully placed (MR       conditional).      The exam was otherwise without abnormality on direct and retroflexion       views. Impression:               - Decreased sphincter tone found on digital rectal                            exam.                           - Redundant colon.                            - One 30 mm polyp in the proximal ascending colon,                            removed with mucosal resection. Resected and                            retrieved. Clips (MR conditional) were placed.                           - The examination was otherwise normal on direct                            and retroflexion views.                           - Mucosal resection was performed. Resection and                            retrieval were complete. Moderate Sedation:      MAC sedation used Recommendation:           - Patient has a contact number available for                            emergencies. The signs and symptoms of potential                            delayed complications were discussed with the                            patient. Return to normal activities tomorrow.  Written discharge instructions were provided to the                            patient.                           - Resume previous diet.                           - Continue present medications.                           - Await pathology results.                           - Repeat colonoscopy is recommended for                            surveillance. The colonoscopy date will be                            determined after pathology results from today's                            exam become available for review. Procedure Code(s):        --- Professional ---                           [id number], Colonoscopy, flexible; with endoscopic                            mucosal resection Diagnosis Code(s):        --- Professional ---                           K62.89, Other specified diseases of anus and rectum                           K63.5, Polyp of colon                           D12.6, Benign neoplasm of colon, unspecified                           Q43.8, Other specified congenital malformations of                            intestine CPT copyright 2019 American Medical Association. All  rights reserved. The codes documented in this report are preliminary and upon coder review may  be revised to meet current compliance requirements. Brailyn Delman L. Loletha Carrow, MD 06/25/2021 8:42:05 AM This report has been signed electronically. Number of Addenda: 0

## 2021-06-25 NOTE — Anesthesia Postprocedure Evaluation (Signed)
Anesthesia Post Note  Patient: [name]  Procedure(s) Performed: COLONOSCOPY WITH PROPOFOL ENDOSCOPIC MUCOSAL RESECTION SUBMUCOSAL INJECTION HEMOSTASIS CLIP PLACEMENT     Patient location during evaluation: PACU Anesthesia Type: MAC Level of consciousness: awake and alert Pain management: pain level controlled Vital Signs Assessment: post-procedure vital signs reviewed and stable Respiratory status: spontaneous breathing and respiratory function stable Cardiovascular status: stable Postop Assessment: no apparent nausea or vomiting Anesthetic complications: no   No notable events documented.  Last Vitals:  Vitals:   06/25/21 0719 06/25/21 0840  BP: (!) 161/68 (!) 165/70  Pulse: 75 69  Resp: 10 12  Temp: 36.9 C 36.8 C  SpO2: 100% 100%    Last Pain:  Vitals:   06/25/21 0840  TempSrc: Oral  PainSc: 0-No pain                 Merlinda Frederick

## 2021-06-25 NOTE — Discharge Instructions (Signed)
YOU HAD AN ENDOSCOPIC PROCEDURE TODAY: Refer to the procedure report and other information in the discharge instructions given to you for any specific questions about what was found during the examination. If this information does not answer your questions, please call [facility] office at [phone] to clarify.  ° °YOU SHOULD EXPECT: Some feelings of bloating in the abdomen. Passage of more gas than usual. Walking can help get rid of the air that was put into your GI tract during the procedure and reduce the bloating. If you had a lower endoscopy (such as a colonoscopy or flexible sigmoidoscopy) you may notice spotting of blood in your stool or on the toilet paper. Some abdominal soreness may be present for a day or two, also. ° °DIET: Your first meal following the procedure should be a light meal and then it is ok to progress to your normal diet. A half-sandwich or bowl of soup is an example of a good first meal. Heavy or fried foods are harder to digest and may make you feel nauseous or bloated. Drink plenty of fluids but you should avoid alcoholic beverages for 24 hours. If you had a esophageal dilation, please see attached instructions for diet.   ° °ACTIVITY: Your care partner should take you home directly after the procedure. You should plan to take it easy, moving slowly for the rest of the day. You can resume normal activity the day after the procedure however YOU SHOULD NOT DRIVE, use power tools, machinery or perform tasks that involve climbing or major physical exertion for 24 hours (because of the sedation medicines used during the test).  ° °SYMPTOMS TO REPORT IMMEDIATELY: °A gastroenterologist can be reached at any hour. Please call [phone]  for any of the following symptoms:  °Following lower endoscopy (colonoscopy, flexible sigmoidoscopy) °Excessive amounts of blood in the stool  °Significant tenderness, worsening of abdominal pains  °Swelling of the abdomen that is new, acute  °Fever of 100° or  higher  °Following upper endoscopy (EGD, EUS, ERCP, esophageal dilation) °Vomiting of blood or coffee ground material  °New, significant abdominal pain  °New, significant chest pain or pain under the shoulder blades  °Painful or persistently difficult swallowing  °New shortness of breath  °Black, tarry-looking or red, bloody stools ° °FOLLOW UP:  °If any biopsies were taken you will be contacted by phone or by letter within the next 1-3 weeks. Call [phone]  if you have not heard about the biopsies in 3 weeks.  °Please also call with any specific questions about appointments or follow up tests. ° °

## 2021-06-26 LAB — SURGICAL PATHOLOGY

## 2021-06-28 ENCOUNTER — Encounter (HOSPITAL_COMMUNITY): Payer: Self-pay | Admitting: Gastroenterology

## 2021-06-30 ENCOUNTER — Encounter: Payer: Self-pay | Admitting: Gastroenterology

## 2021-08-28 DIAGNOSIS — I10 Essential (primary) hypertension: Secondary | ICD-10-CM | POA: Diagnosis not present

## 2021-08-28 DIAGNOSIS — E876 Hypokalemia: Secondary | ICD-10-CM | POA: Diagnosis not present

## 2021-08-28 DIAGNOSIS — E039 Hypothyroidism, unspecified: Secondary | ICD-10-CM | POA: Diagnosis not present

## 2021-08-28 DIAGNOSIS — E782 Mixed hyperlipidemia: Secondary | ICD-10-CM | POA: Diagnosis not present

## 2021-08-28 DIAGNOSIS — E7849 Other hyperlipidemia: Secondary | ICD-10-CM | POA: Diagnosis not present

## 2021-08-28 DIAGNOSIS — E1165 Type 2 diabetes mellitus with hyperglycemia: Secondary | ICD-10-CM | POA: Diagnosis not present

## 2021-08-31 DIAGNOSIS — I25810 Atherosclerosis of coronary artery bypass graft(s) without angina pectoris: Secondary | ICD-10-CM | POA: Diagnosis not present

## 2021-08-31 DIAGNOSIS — D126 Benign neoplasm of colon, unspecified: Secondary | ICD-10-CM | POA: Diagnosis not present

## 2021-08-31 DIAGNOSIS — I10 Essential (primary) hypertension: Secondary | ICD-10-CM | POA: Diagnosis not present

## 2021-08-31 DIAGNOSIS — E1165 Type 2 diabetes mellitus with hyperglycemia: Secondary | ICD-10-CM | POA: Diagnosis not present

## 2021-08-31 DIAGNOSIS — Z955 Presence of coronary angioplasty implant and graft: Secondary | ICD-10-CM | POA: Diagnosis not present

## 2021-08-31 DIAGNOSIS — E7849 Other hyperlipidemia: Secondary | ICD-10-CM | POA: Diagnosis not present

## 2021-08-31 DIAGNOSIS — Z951 Presence of aortocoronary bypass graft: Secondary | ICD-10-CM | POA: Diagnosis not present

## 2021-08-31 DIAGNOSIS — K2950 Unspecified chronic gastritis without bleeding: Secondary | ICD-10-CM | POA: Diagnosis not present

## 2021-10-23 DIAGNOSIS — E1165 Type 2 diabetes mellitus with hyperglycemia: Secondary | ICD-10-CM | POA: Diagnosis not present

## 2021-10-23 DIAGNOSIS — K760 Fatty (change of) liver, not elsewhere classified: Secondary | ICD-10-CM | POA: Diagnosis not present

## 2021-10-23 DIAGNOSIS — I10 Essential (primary) hypertension: Secondary | ICD-10-CM | POA: Diagnosis not present

## 2021-10-23 DIAGNOSIS — N183 Chronic kidney disease, stage 3 unspecified: Secondary | ICD-10-CM | POA: Diagnosis not present

## 2021-10-23 DIAGNOSIS — E7849 Other hyperlipidemia: Secondary | ICD-10-CM | POA: Diagnosis not present

## 2021-10-23 DIAGNOSIS — N189 Chronic kidney disease, unspecified: Secondary | ICD-10-CM | POA: Diagnosis not present

## 2021-10-23 DIAGNOSIS — E782 Mixed hyperlipidemia: Secondary | ICD-10-CM | POA: Diagnosis not present

## 2021-10-23 DIAGNOSIS — K719 Toxic liver disease, unspecified: Secondary | ICD-10-CM | POA: Diagnosis not present

## 2021-11-26 DIAGNOSIS — K2950 Unspecified chronic gastritis without bleeding: Secondary | ICD-10-CM | POA: Diagnosis not present

## 2021-11-26 DIAGNOSIS — E7849 Other hyperlipidemia: Secondary | ICD-10-CM | POA: Diagnosis not present

## 2021-11-26 DIAGNOSIS — Z951 Presence of aortocoronary bypass graft: Secondary | ICD-10-CM | POA: Diagnosis not present

## 2021-11-26 DIAGNOSIS — I25810 Atherosclerosis of coronary artery bypass graft(s) without angina pectoris: Secondary | ICD-10-CM | POA: Diagnosis not present

## 2021-11-26 DIAGNOSIS — I10 Essential (primary) hypertension: Secondary | ICD-10-CM | POA: Diagnosis not present

## 2021-11-26 DIAGNOSIS — E1165 Type 2 diabetes mellitus with hyperglycemia: Secondary | ICD-10-CM | POA: Diagnosis not present

## 2021-11-26 DIAGNOSIS — Z955 Presence of coronary angioplasty implant and graft: Secondary | ICD-10-CM | POA: Diagnosis not present

## 2022-01-31 ENCOUNTER — Encounter: Payer: Self-pay | Admitting: Gastroenterology

## 2022-02-26 DIAGNOSIS — E876 Hypokalemia: Secondary | ICD-10-CM | POA: Diagnosis not present

## 2022-02-26 DIAGNOSIS — E1165 Type 2 diabetes mellitus with hyperglycemia: Secondary | ICD-10-CM | POA: Diagnosis not present

## 2022-02-26 DIAGNOSIS — N183 Chronic kidney disease, stage 3 unspecified: Secondary | ICD-10-CM | POA: Diagnosis not present

## 2022-02-26 DIAGNOSIS — K219 Gastro-esophageal reflux disease without esophagitis: Secondary | ICD-10-CM | POA: Diagnosis not present

## 2022-02-26 DIAGNOSIS — E7849 Other hyperlipidemia: Secondary | ICD-10-CM | POA: Diagnosis not present

## 2022-02-26 DIAGNOSIS — E039 Hypothyroidism, unspecified: Secondary | ICD-10-CM | POA: Diagnosis not present

## 2022-02-26 DIAGNOSIS — I10 Essential (primary) hypertension: Secondary | ICD-10-CM | POA: Diagnosis not present

## 2022-02-26 DIAGNOSIS — K760 Fatty (change of) liver, not elsewhere classified: Secondary | ICD-10-CM | POA: Diagnosis not present

## 2022-03-03 DIAGNOSIS — E7849 Other hyperlipidemia: Secondary | ICD-10-CM | POA: Diagnosis not present

## 2022-03-03 DIAGNOSIS — E039 Hypothyroidism, unspecified: Secondary | ICD-10-CM | POA: Diagnosis not present

## 2022-03-03 DIAGNOSIS — I10 Essential (primary) hypertension: Secondary | ICD-10-CM | POA: Diagnosis not present

## 2022-03-03 DIAGNOSIS — K2950 Unspecified chronic gastritis without bleeding: Secondary | ICD-10-CM | POA: Diagnosis not present

## 2022-03-03 DIAGNOSIS — E1165 Type 2 diabetes mellitus with hyperglycemia: Secondary | ICD-10-CM | POA: Diagnosis not present

## 2022-03-03 DIAGNOSIS — Z955 Presence of coronary angioplasty implant and graft: Secondary | ICD-10-CM | POA: Diagnosis not present

## 2022-03-03 DIAGNOSIS — I25810 Atherosclerosis of coronary artery bypass graft(s) without angina pectoris: Secondary | ICD-10-CM | POA: Diagnosis not present

## 2022-03-03 DIAGNOSIS — D126 Benign neoplasm of colon, unspecified: Secondary | ICD-10-CM | POA: Diagnosis not present

## 2022-03-03 DIAGNOSIS — Z951 Presence of aortocoronary bypass graft: Secondary | ICD-10-CM | POA: Diagnosis not present

## 2022-03-12 IMAGING — CR DG CHEST 2V
2 series · 2 of 2 positions shown · non-contrast
Comparison: 06/18/2016

CLINICAL DATA: Chest pain since this morning, history of diabetes
mellitus, hypertension, coronary artery disease post MI, CABG and
coronary stenting

EXAM:
CHEST - 2 VIEW

[chest pa]
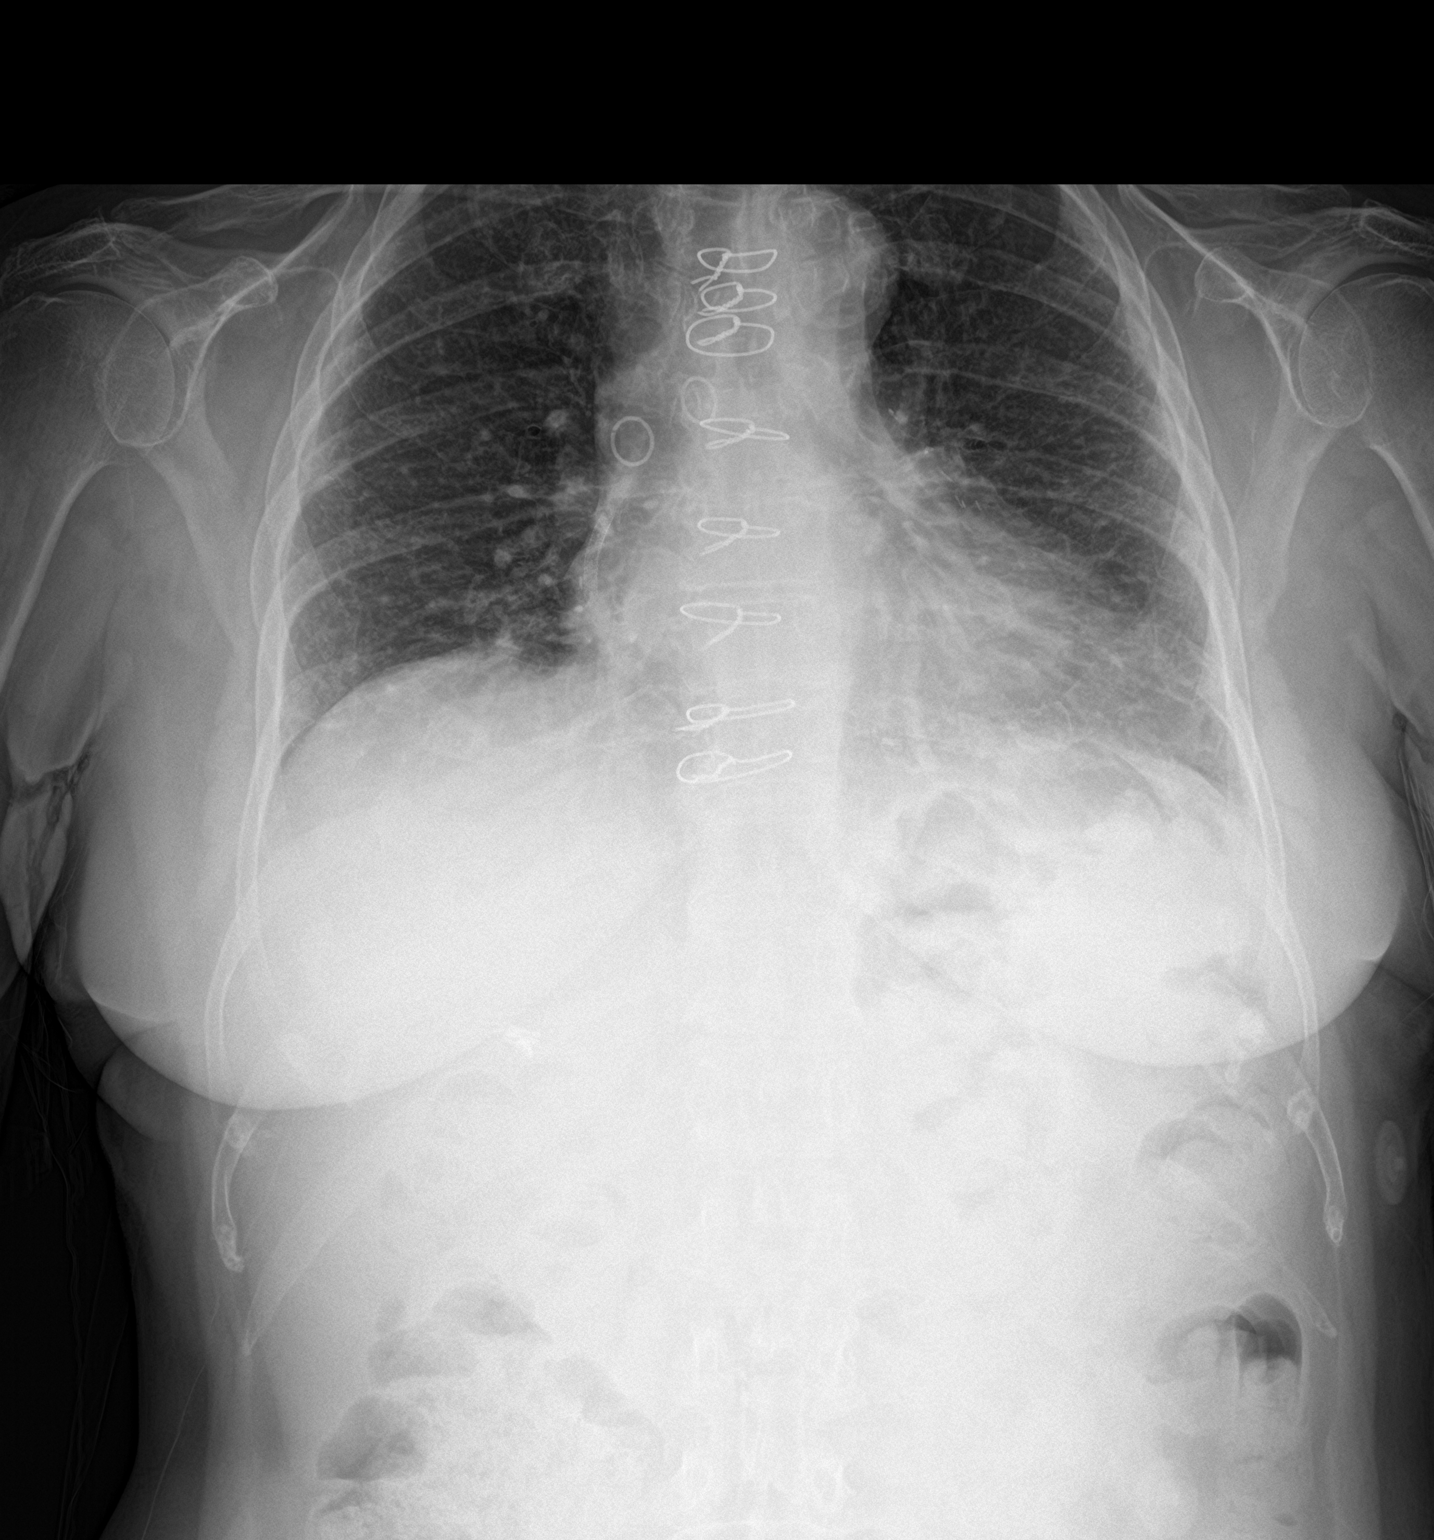

[chest lat]
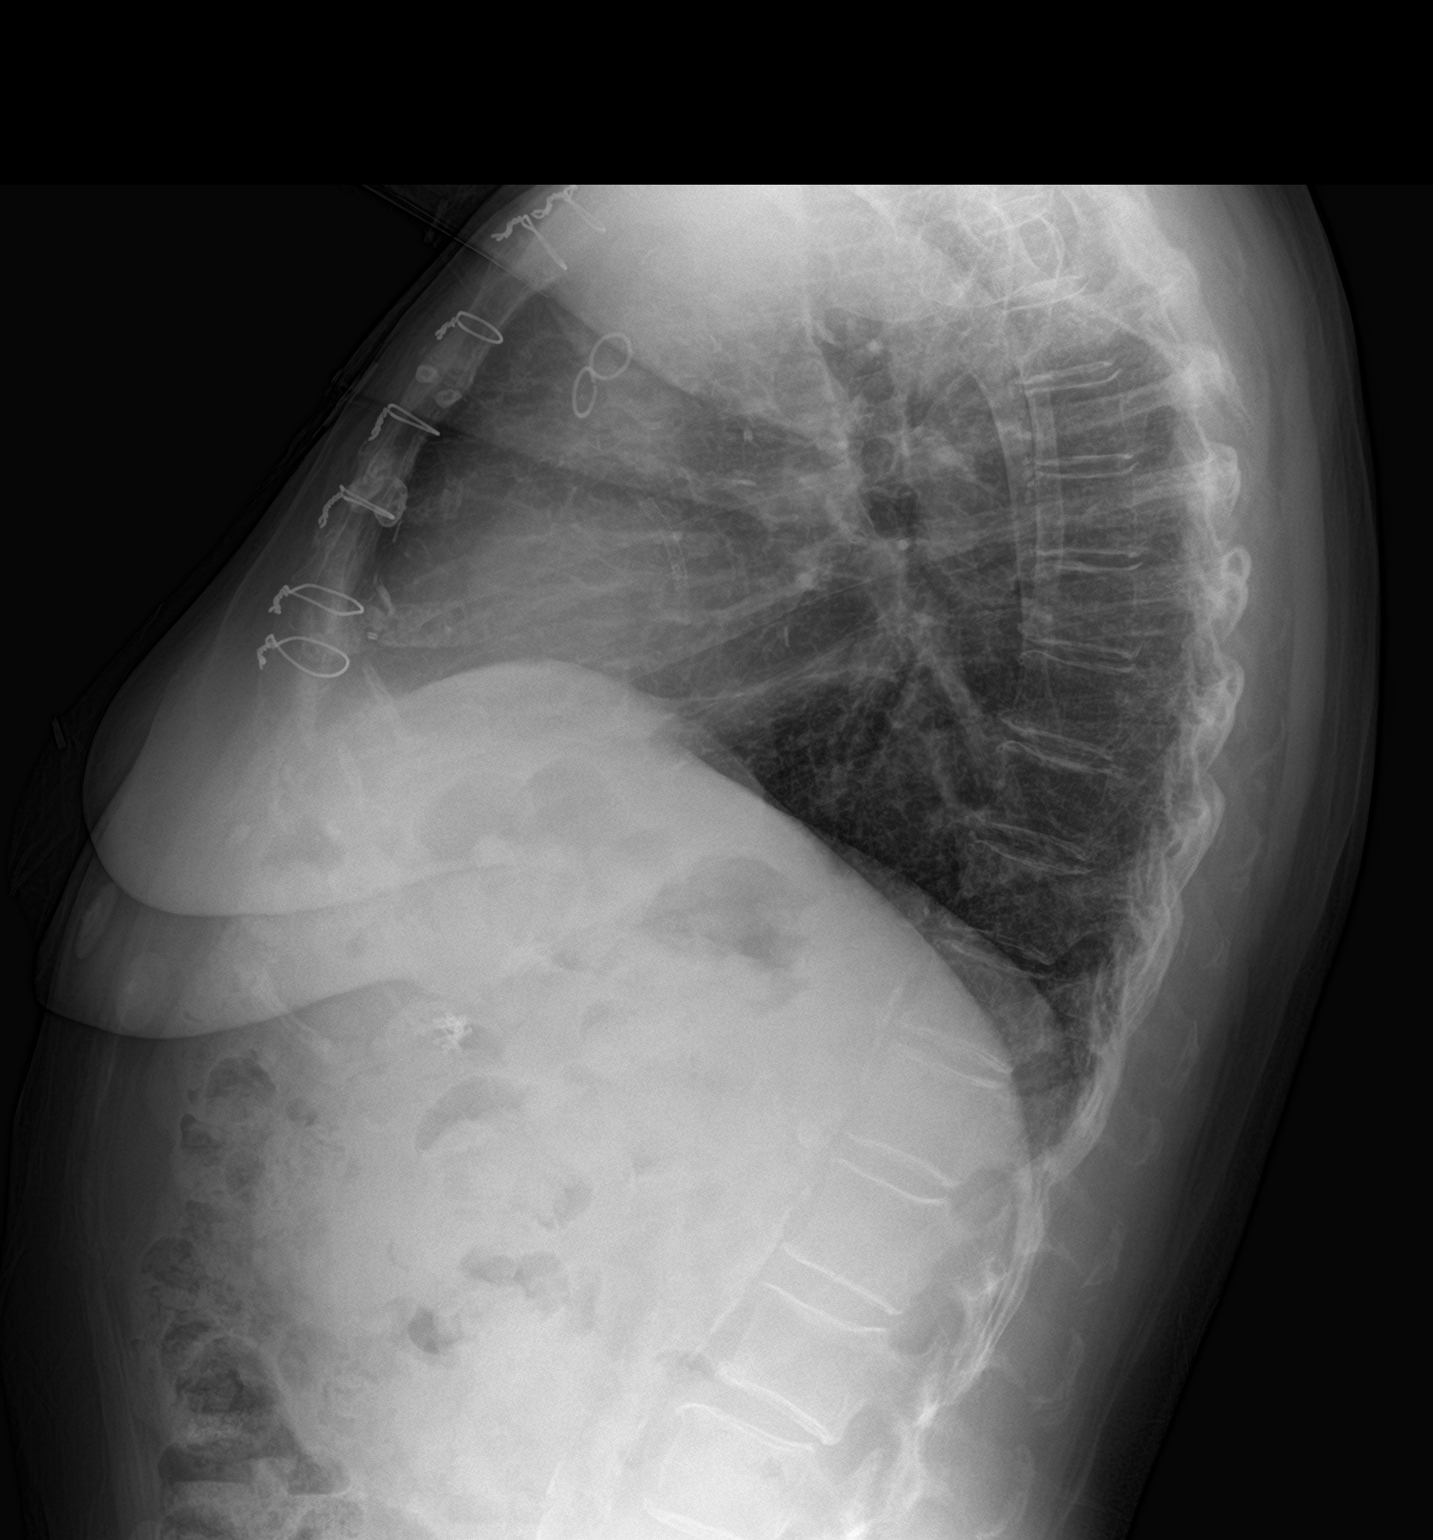

[2 of 2 positions shown; findings below may reference images not displayed]

FINDINGS: Upper normal heart size post CABG.

Mediastinal contours and pulmonary vascularity normal.

Mild atherosclerotic calcification aorta.

Coronary stents identified along RIGHT heart border.

Minimal bibasilar atelectasis.

Lungs otherwise clear.

No pleural effusion or pneumothorax.

Bones demineralized.
IMPRESSION: Post CABG and coronary stenting.

Minimal bibasilar atelectasis.

## 2022-03-13 IMAGING — CT CT ANGIO CHEST
2 of 6 series · 19 of 46 positions shown · IV contrast (omnipaque)
Comparison: None.

CLINICAL DATA: Chest pain, bundle branch block

EXAM:
CT ANGIOGRAPHY CHEST WITH CONTRAST
TECHNIQUE: Multidetector CT imaging of the chest was performed using the
standard protocol during bolus administration of intravenous
contrast. Multiplanar CT image reconstructions and MIPs were
obtained to evaluate the vascular anatomy.
CONTRAST:  100mL OMNIPAQUE IOHEXOL 350 MG/ML SOLN

[Series 5: thoracic cta 2mm · axial · 0.80mm/px · z∈[+1070,+1354]mm · 16 of 162 slices shown]
[im 10/162  lung]
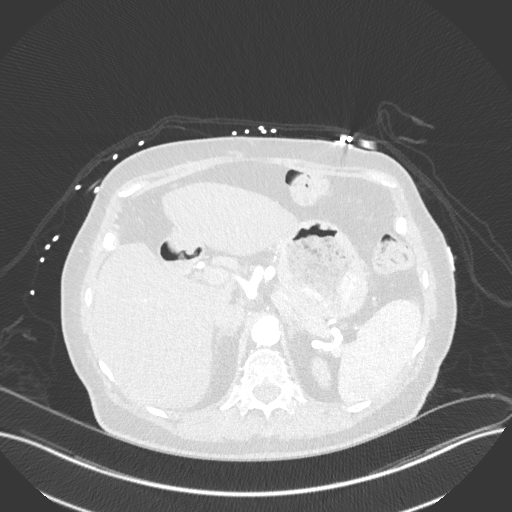
[im 19/162  soft-tissue]
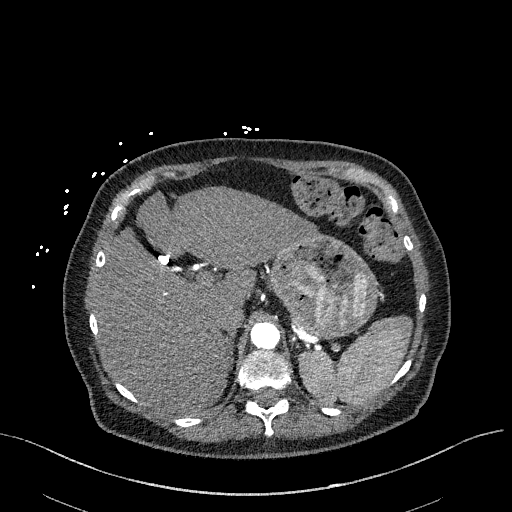
[im 29/162  lung]
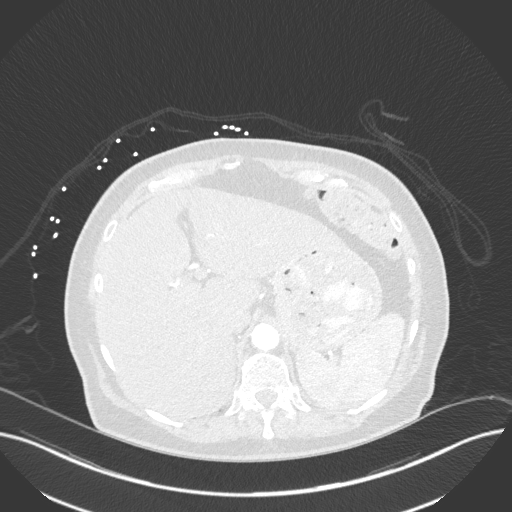
[im 38/162  soft-tissue]
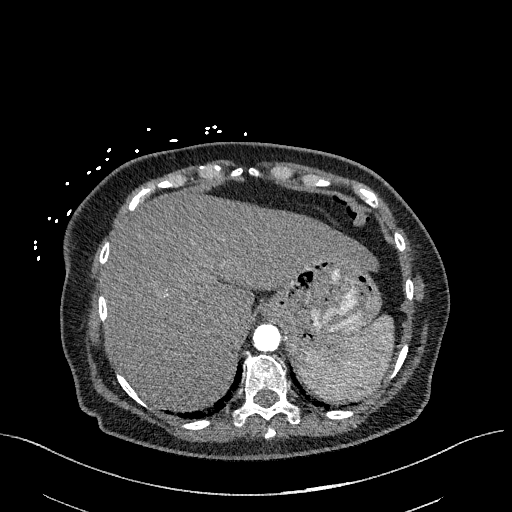
[im 48/162  lung]
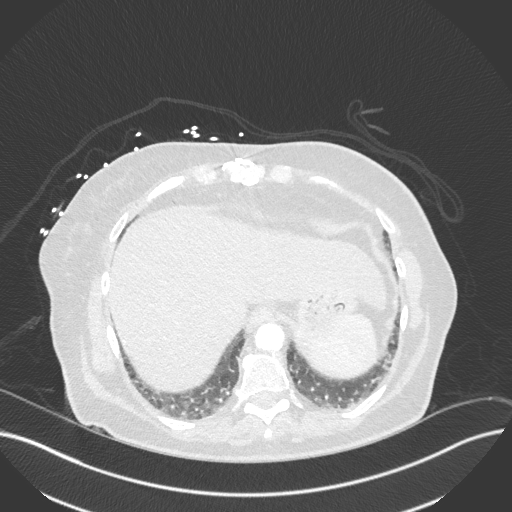
[im 57/162  soft-tissue]
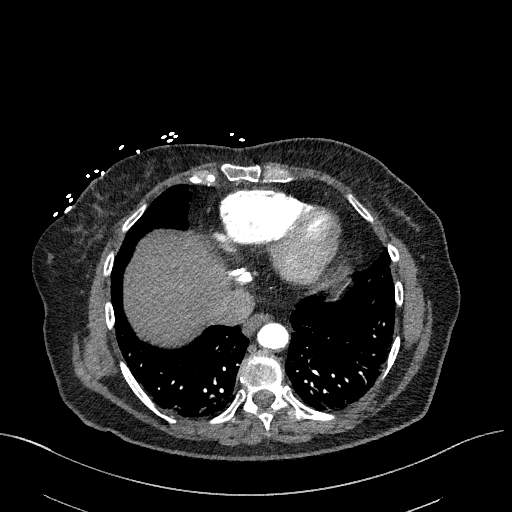
[im 67/162  lung]
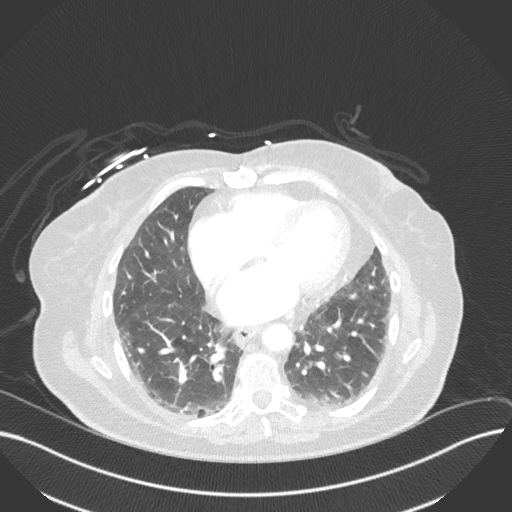
[im 76/162  soft-tissue]
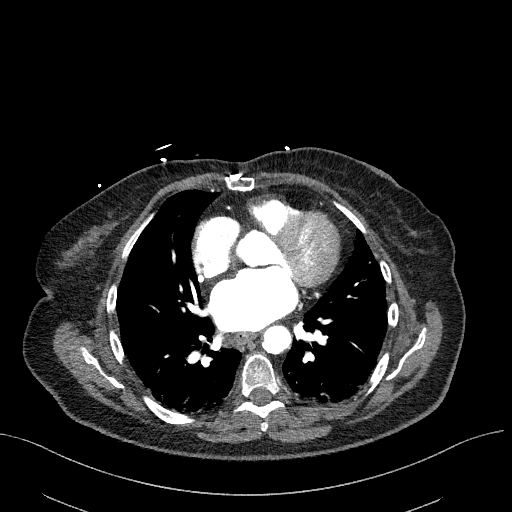
[im 86/162  lung]
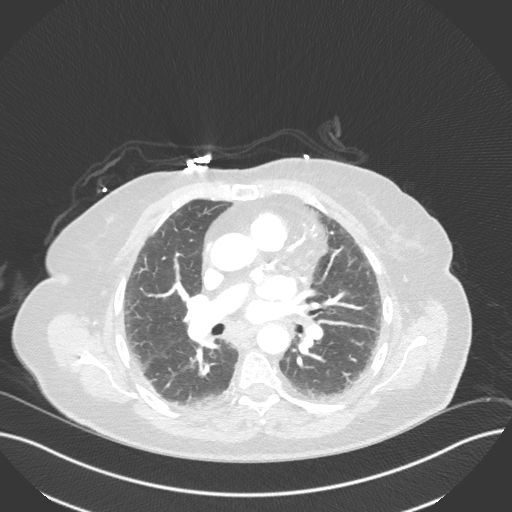
[im 95/162  soft-tissue]
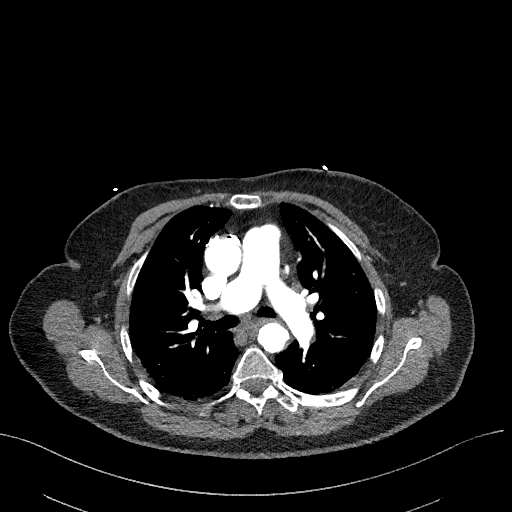
[im 105/162  lung]
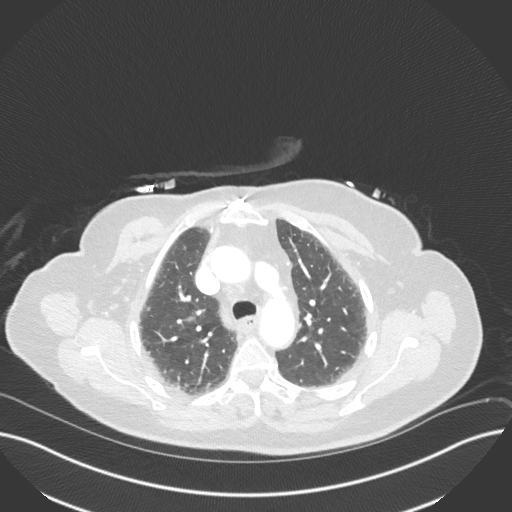
[im 114/162  soft-tissue]
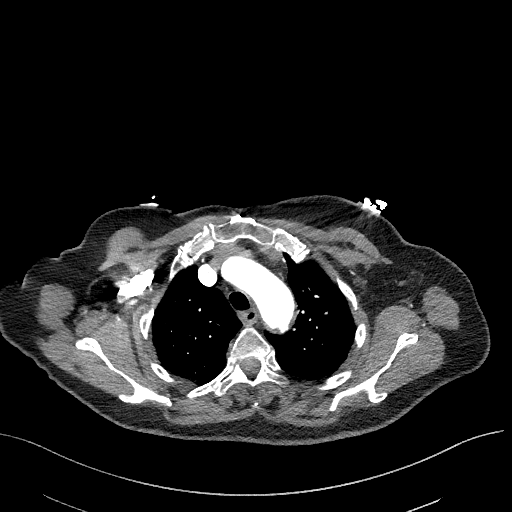
[im 124/162  lung]
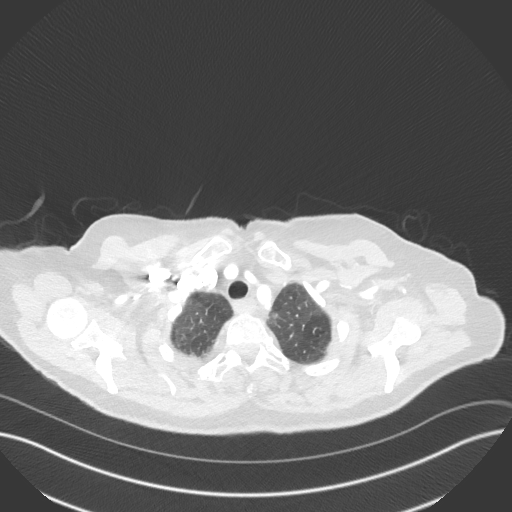
[im 133/162  soft-tissue]
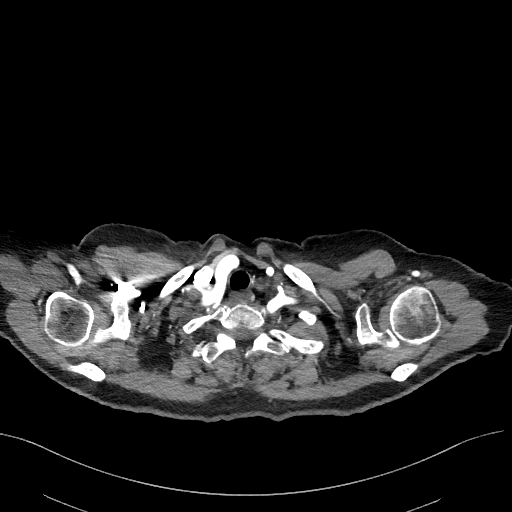
[im 143/162  lung]
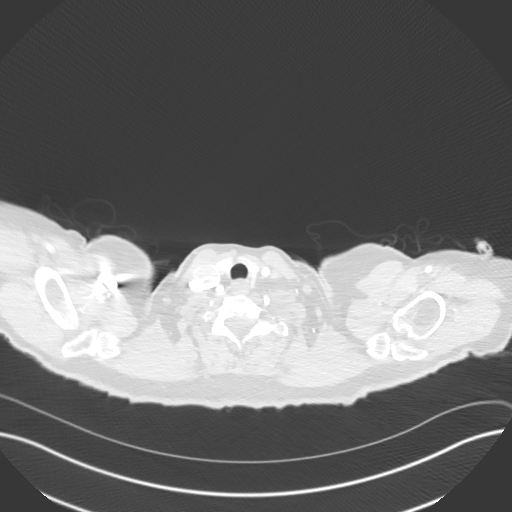
[im 152/162  soft-tissue]
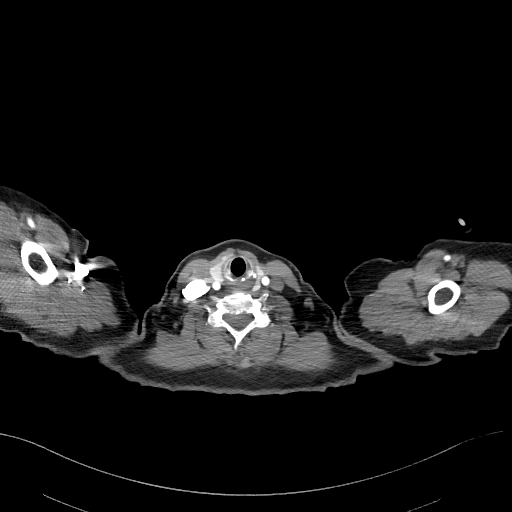

[Series 8: thoracic cta 2mm cor · coronal · 0.67mm/px · 3 of 148 slices shown]
[im 37/148  soft-tissue]
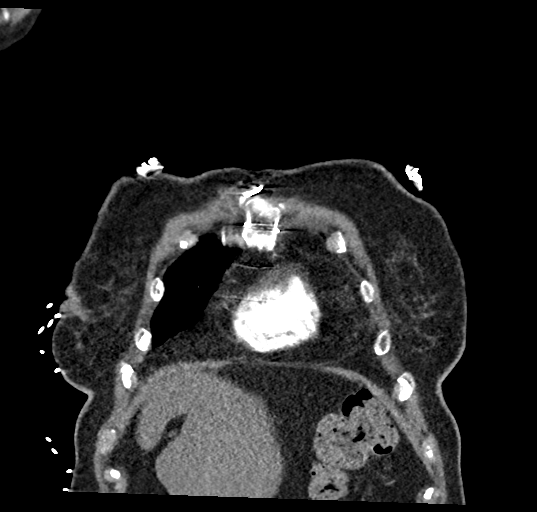
[im 74/148  soft-tissue]
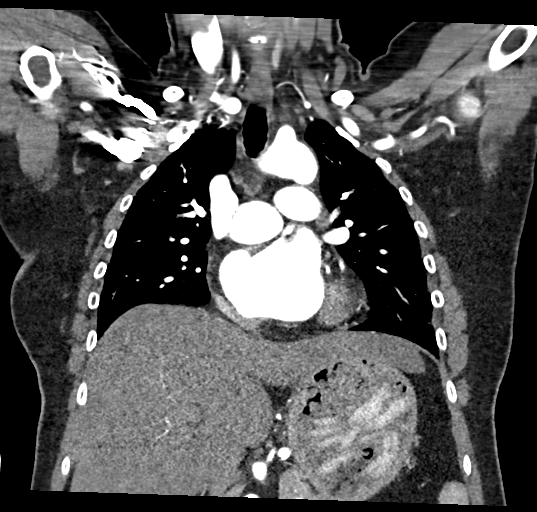
[im 111/148  soft-tissue]
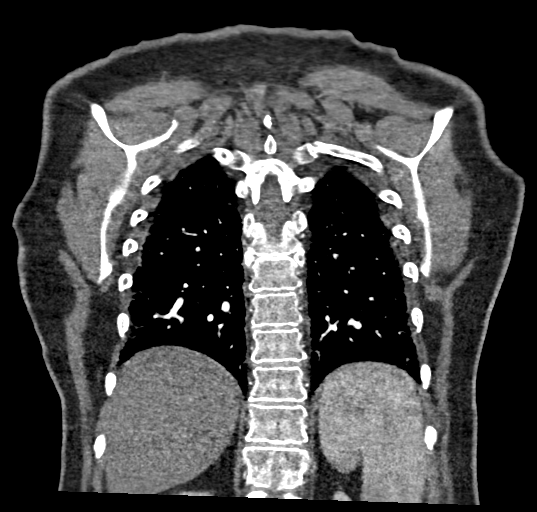

[19 of 46 positions shown; findings below may reference images not displayed]

FINDINGS: Cardiovascular: Borderline cardiomegaly. No pericardial effusion.
Borderline dilated central pulmonary arteries. Satisfactory
opacification of pulmonary arteries noted, and there is no evidence
of pulmonary emboli. 3-vessel coronary calcifications. Previous
CABG. Calcified atheromatous plaque throughout the descending
thoracic aorta which is ectatic up to 3.1 cm diameter proximally. No
dissection or stenosis. Classic 3 vessel brachiocephalic arterial
origin anatomy without stenosis.

Mediastinum/Nodes: No hilar or mediastinal adenopathy.

Lungs/Pleura: No pleural effusion. No pneumothorax. Minimal
dependent atelectasis/scarring posteriorly in both lungs.

Upper Abdomen: Cholecystectomy clips. No acute findings.

Musculoskeletal: Sternotomy wires. No fracture or worrisome bone
lesion.

Review of the MIP images confirms the above findings.
IMPRESSION: Negative for acute PE or thoracic aortic dissection.

Aortic Atherosclerosis ([id number]) with 3.1 cm fusiform dilatation
of the proximal descending thoracic segment.

## 2022-05-31 DIAGNOSIS — N183 Chronic kidney disease, stage 3 unspecified: Secondary | ICD-10-CM | POA: Diagnosis not present

## 2022-05-31 DIAGNOSIS — E7849 Other hyperlipidemia: Secondary | ICD-10-CM | POA: Diagnosis not present

## 2022-05-31 DIAGNOSIS — K219 Gastro-esophageal reflux disease without esophagitis: Secondary | ICD-10-CM | POA: Diagnosis not present

## 2022-05-31 DIAGNOSIS — E782 Mixed hyperlipidemia: Secondary | ICD-10-CM | POA: Diagnosis not present

## 2022-05-31 DIAGNOSIS — E1165 Type 2 diabetes mellitus with hyperglycemia: Secondary | ICD-10-CM | POA: Diagnosis not present

## 2022-06-08 DIAGNOSIS — Z951 Presence of aortocoronary bypass graft: Secondary | ICD-10-CM | POA: Diagnosis not present

## 2022-06-08 DIAGNOSIS — Z23 Encounter for immunization: Secondary | ICD-10-CM | POA: Diagnosis not present

## 2022-06-08 DIAGNOSIS — K2950 Unspecified chronic gastritis without bleeding: Secondary | ICD-10-CM | POA: Diagnosis not present

## 2022-06-08 DIAGNOSIS — E039 Hypothyroidism, unspecified: Secondary | ICD-10-CM | POA: Diagnosis not present

## 2022-06-08 DIAGNOSIS — E1165 Type 2 diabetes mellitus with hyperglycemia: Secondary | ICD-10-CM | POA: Diagnosis not present

## 2022-06-08 DIAGNOSIS — Z955 Presence of coronary angioplasty implant and graft: Secondary | ICD-10-CM | POA: Diagnosis not present

## 2022-06-08 DIAGNOSIS — I10 Essential (primary) hypertension: Secondary | ICD-10-CM | POA: Diagnosis not present

## 2022-06-08 DIAGNOSIS — E7849 Other hyperlipidemia: Secondary | ICD-10-CM | POA: Diagnosis not present

## 2022-06-08 DIAGNOSIS — I25810 Atherosclerosis of coronary artery bypass graft(s) without angina pectoris: Secondary | ICD-10-CM | POA: Diagnosis not present

## 2022-08-05 DIAGNOSIS — R030 Elevated blood-pressure reading, without diagnosis of hypertension: Secondary | ICD-10-CM | POA: Diagnosis not present

## 2022-08-05 DIAGNOSIS — R609 Edema, unspecified: Secondary | ICD-10-CM | POA: Diagnosis not present

## 2022-08-05 DIAGNOSIS — H6121 Impacted cerumen, right ear: Secondary | ICD-10-CM | POA: Diagnosis not present

## 2022-08-05 DIAGNOSIS — I25810 Atherosclerosis of coronary artery bypass graft(s) without angina pectoris: Secondary | ICD-10-CM | POA: Diagnosis not present

## 2022-08-05 DIAGNOSIS — H9190 Unspecified hearing loss, unspecified ear: Secondary | ICD-10-CM | POA: Diagnosis not present

## 2022-08-05 DIAGNOSIS — Z974 Presence of external hearing-aid: Secondary | ICD-10-CM | POA: Diagnosis not present

## 2022-08-05 DIAGNOSIS — E7849 Other hyperlipidemia: Secondary | ICD-10-CM | POA: Diagnosis not present

## 2022-08-05 DIAGNOSIS — Z951 Presence of aortocoronary bypass graft: Secondary | ICD-10-CM | POA: Diagnosis not present

## 2022-08-05 DIAGNOSIS — E039 Hypothyroidism, unspecified: Secondary | ICD-10-CM | POA: Diagnosis not present

## 2022-09-01 DIAGNOSIS — E039 Hypothyroidism, unspecified: Secondary | ICD-10-CM | POA: Diagnosis not present

## 2022-09-01 DIAGNOSIS — E7849 Other hyperlipidemia: Secondary | ICD-10-CM | POA: Diagnosis not present

## 2022-09-01 DIAGNOSIS — I10 Essential (primary) hypertension: Secondary | ICD-10-CM | POA: Diagnosis not present

## 2022-09-01 DIAGNOSIS — N183 Chronic kidney disease, stage 3 unspecified: Secondary | ICD-10-CM | POA: Diagnosis not present

## 2022-09-01 DIAGNOSIS — K219 Gastro-esophageal reflux disease without esophagitis: Secondary | ICD-10-CM | POA: Diagnosis not present

## 2022-09-01 DIAGNOSIS — E559 Vitamin D deficiency, unspecified: Secondary | ICD-10-CM | POA: Diagnosis not present

## 2022-09-01 DIAGNOSIS — E782 Mixed hyperlipidemia: Secondary | ICD-10-CM | POA: Diagnosis not present

## 2022-09-01 DIAGNOSIS — E876 Hypokalemia: Secondary | ICD-10-CM | POA: Diagnosis not present

## 2022-09-01 DIAGNOSIS — E1165 Type 2 diabetes mellitus with hyperglycemia: Secondary | ICD-10-CM | POA: Diagnosis not present

## 2022-09-08 DIAGNOSIS — E7849 Other hyperlipidemia: Secondary | ICD-10-CM | POA: Diagnosis not present

## 2022-09-08 DIAGNOSIS — Z951 Presence of aortocoronary bypass graft: Secondary | ICD-10-CM | POA: Diagnosis not present

## 2022-09-08 DIAGNOSIS — Z955 Presence of coronary angioplasty implant and graft: Secondary | ICD-10-CM | POA: Diagnosis not present

## 2022-09-08 DIAGNOSIS — E039 Hypothyroidism, unspecified: Secondary | ICD-10-CM | POA: Diagnosis not present

## 2022-09-08 DIAGNOSIS — I10 Essential (primary) hypertension: Secondary | ICD-10-CM | POA: Diagnosis not present

## 2022-09-08 DIAGNOSIS — E1169 Type 2 diabetes mellitus with other specified complication: Secondary | ICD-10-CM | POA: Diagnosis not present

## 2022-09-08 DIAGNOSIS — I25810 Atherosclerosis of coronary artery bypass graft(s) without angina pectoris: Secondary | ICD-10-CM | POA: Diagnosis not present

## 2023-03-11 DIAGNOSIS — E039 Hypothyroidism, unspecified: Secondary | ICD-10-CM | POA: Diagnosis not present

## 2023-03-11 DIAGNOSIS — E1165 Type 2 diabetes mellitus with hyperglycemia: Secondary | ICD-10-CM | POA: Diagnosis not present

## 2023-03-11 DIAGNOSIS — E7849 Other hyperlipidemia: Secondary | ICD-10-CM | POA: Diagnosis not present

## 2023-03-11 DIAGNOSIS — E7800 Pure hypercholesterolemia, unspecified: Secondary | ICD-10-CM | POA: Diagnosis not present

## 2023-03-11 DIAGNOSIS — N183 Chronic kidney disease, stage 3 unspecified: Secondary | ICD-10-CM | POA: Diagnosis not present

## 2023-03-11 DIAGNOSIS — E559 Vitamin D deficiency, unspecified: Secondary | ICD-10-CM | POA: Diagnosis not present

## 2023-03-11 DIAGNOSIS — Z0001 Encounter for general adult medical examination with abnormal findings: Secondary | ICD-10-CM | POA: Diagnosis not present

## 2023-03-11 DIAGNOSIS — K719 Toxic liver disease, unspecified: Secondary | ICD-10-CM | POA: Diagnosis not present

## 2023-03-11 DIAGNOSIS — E1169 Type 2 diabetes mellitus with other specified complication: Secondary | ICD-10-CM | POA: Diagnosis not present

## 2023-03-14 ENCOUNTER — Telehealth: Payer: Self-pay | Admitting: *Deleted

## 2023-03-14 DIAGNOSIS — I25810 Atherosclerosis of coronary artery bypass graft(s) without angina pectoris: Secondary | ICD-10-CM | POA: Diagnosis not present

## 2023-03-14 DIAGNOSIS — I10 Essential (primary) hypertension: Secondary | ICD-10-CM | POA: Diagnosis not present

## 2023-03-14 DIAGNOSIS — Z0001 Encounter for general adult medical examination with abnormal findings: Secondary | ICD-10-CM | POA: Diagnosis not present

## 2023-03-14 DIAGNOSIS — E559 Vitamin D deficiency, unspecified: Secondary | ICD-10-CM | POA: Diagnosis not present

## 2023-03-14 DIAGNOSIS — E7849 Other hyperlipidemia: Secondary | ICD-10-CM | POA: Diagnosis not present

## 2023-03-14 DIAGNOSIS — D126 Benign neoplasm of colon, unspecified: Secondary | ICD-10-CM | POA: Diagnosis not present

## 2023-03-14 DIAGNOSIS — Z23 Encounter for immunization: Secondary | ICD-10-CM | POA: Diagnosis not present

## 2023-03-14 DIAGNOSIS — E1169 Type 2 diabetes mellitus with other specified complication: Secondary | ICD-10-CM | POA: Diagnosis not present

## 2023-03-14 DIAGNOSIS — Z951 Presence of aortocoronary bypass graft: Secondary | ICD-10-CM | POA: Diagnosis not present

## 2023-03-14 DIAGNOSIS — Z955 Presence of coronary angioplasty implant and graft: Secondary | ICD-10-CM | POA: Diagnosis not present

## 2023-03-14 NOTE — Patient Outreach (Signed)
  Care Coordination   03/14/2023 Name: [name] MRN: [id number] DOB: [birth year]   Care Coordination Outreach Attempts:  An unsuccessful telephone outreach was attempted today to offer the patient information about available care coordination services.  Follow Up Plan:  Additional outreach attempts will be made to offer the patient care coordination information and services.   Encounter Outcome:  Pt. Request to Call Back. Pt was unavailable. Husband asked to call back.   Care Coordination Interventions:  No, not indicated    Demetrios Loll, BSN, RN-BC RN Care Coordinator [facility]  [facility] Direct Dial: [phone] Main #: [phone]

## 2023-06-23 DIAGNOSIS — Z23 Encounter for immunization: Secondary | ICD-10-CM | POA: Diagnosis not present

## 2023-06-30 DIAGNOSIS — Z23 Encounter for immunization: Secondary | ICD-10-CM | POA: Diagnosis not present

## 2023-09-04 ENCOUNTER — Encounter (HOSPITAL_COMMUNITY): Payer: Self-pay

## 2023-09-04 ENCOUNTER — Emergency Department (HOSPITAL_COMMUNITY): Payer: Medicare Other

## 2023-09-04 ENCOUNTER — Emergency Department (HOSPITAL_COMMUNITY)
Admission: EM | Admit: 2023-09-04 | Discharge: 2023-09-04 | Disposition: A | Discharge status: Home / Self Care | Payer: Medicare Other | Attending: Emergency Medicine | Admitting: Emergency Medicine

## 2023-09-04 ENCOUNTER — Other Ambulatory Visit: Payer: Self-pay

## 2023-09-04 DIAGNOSIS — Z951 Presence of aortocoronary bypass graft: Secondary | ICD-10-CM | POA: Diagnosis not present

## 2023-09-04 DIAGNOSIS — B9789 Other viral agents as the cause of diseases classified elsewhere: Secondary | ICD-10-CM | POA: Diagnosis not present

## 2023-09-04 DIAGNOSIS — E119 Type 2 diabetes mellitus without complications: Secondary | ICD-10-CM | POA: Diagnosis not present

## 2023-09-04 DIAGNOSIS — Z79899 Other long term (current) drug therapy: Secondary | ICD-10-CM | POA: Diagnosis not present

## 2023-09-04 DIAGNOSIS — R7401 Elevation of levels of liver transaminase levels: Secondary | ICD-10-CM | POA: Insufficient documentation

## 2023-09-04 DIAGNOSIS — I509 Heart failure, unspecified: Secondary | ICD-10-CM | POA: Diagnosis not present

## 2023-09-04 DIAGNOSIS — I2510 Atherosclerotic heart disease of native coronary artery without angina pectoris: Secondary | ICD-10-CM | POA: Insufficient documentation

## 2023-09-04 DIAGNOSIS — Z20822 Contact with and (suspected) exposure to covid-19: Secondary | ICD-10-CM | POA: Insufficient documentation

## 2023-09-04 DIAGNOSIS — I110 Hypertensive heart disease with heart failure: Secondary | ICD-10-CM | POA: Insufficient documentation

## 2023-09-04 DIAGNOSIS — J449 Chronic obstructive pulmonary disease, unspecified: Secondary | ICD-10-CM | POA: Diagnosis not present

## 2023-09-04 DIAGNOSIS — Z7982 Long term (current) use of aspirin: Secondary | ICD-10-CM | POA: Insufficient documentation

## 2023-09-04 DIAGNOSIS — R1084 Generalized abdominal pain: Secondary | ICD-10-CM | POA: Insufficient documentation

## 2023-09-04 DIAGNOSIS — J069 Acute upper respiratory infection, unspecified: Secondary | ICD-10-CM | POA: Insufficient documentation

## 2023-09-04 DIAGNOSIS — R059 Cough, unspecified: Secondary | ICD-10-CM | POA: Diagnosis not present

## 2023-09-04 DIAGNOSIS — R0989 Other specified symptoms and signs involving the circulatory and respiratory systems: Secondary | ICD-10-CM | POA: Diagnosis not present

## 2023-09-04 DIAGNOSIS — R109 Unspecified abdominal pain: Secondary | ICD-10-CM | POA: Diagnosis not present

## 2023-09-04 LAB — COMPREHENSIVE METABOLIC PANEL
ALT: 48 U/L — ABNORMAL HIGH (ref 0–44)
AST: 43 U/L — ABNORMAL HIGH (ref 15–41)
Albumin: 4.9 g/dL (ref 3.5–5.0)
Alkaline Phosphatase: 108 U/L (ref 38–126)
Anion gap: 13 (ref 5–15)
BUN: 16 mg/dL (ref 8–23)
CO2: 23 mmol/L (ref 22–32)
Calcium: 10.4 mg/dL — ABNORMAL HIGH (ref 8.9–10.3)
Chloride: 99 mmol/L (ref 98–111)
Creatinine, Ser: 0.81 mg/dL (ref 0.44–1.00)
GFR, Estimated: >60 mL/min (ref >60)
Glucose, Bld: 328 mg/dL — ABNORMAL HIGH (ref 70–99)
Potassium: 3.9 mmol/L (ref 3.5–5.1)
Sodium: 135 mmol/L (ref 135–145)
Total Bilirubin: 1.6 mg/dL — ABNORMAL HIGH (ref 0.0–1.2)
Total Protein: 8.5 g/dL — ABNORMAL HIGH (ref 6.5–8.1)

## 2023-09-04 LAB — CBC WITH DIFFERENTIAL/PLATELET
Abs Immature Granulocytes: 0.01 10*3/uL (ref 0.00–0.07)
Basophils Absolute: 0 10*3/uL (ref 0.0–0.1)
Basophils Relative: 1 %
Eosinophils Absolute: 0.1 10*3/uL (ref 0.0–0.5)
Eosinophils Relative: 1 %
HCT: 44.7 % (ref 36.0–46.0)
Hemoglobin: 16.1 g/dL — ABNORMAL HIGH (ref 12.0–15.0)
Immature Granulocytes: 0 %
Lymphocytes Relative: 24 %
Lymphs Abs: 1.5 10*3/uL (ref 0.7–4.0)
MCH: 31.8 pg (ref 26.0–34.0)
MCHC: 36 g/dL (ref 30.0–36.0)
MCV: 88.3 fL (ref 80.0–100.0)
Monocytes Absolute: 0.4 10*3/uL (ref 0.1–1.0)
Monocytes Relative: 7 %
Neutro Abs: 4.1 10*3/uL (ref 1.7–7.7)
Neutrophils Relative %: 67 %
Platelets: 296 10*3/uL (ref 150–400)
RBC: 5.06 MIL/uL (ref 3.87–5.11)
RDW: 12.7 % (ref 11.5–15.5)
WBC: 6.1 10*3/uL (ref 4.0–10.5)
nRBC: 0 % (ref 0.0–0.2)

## 2023-09-04 LAB — URINALYSIS, ROUTINE W REFLEX MICROSCOPIC
Bacteria, UA: NONE SEEN
Bilirubin Urine: NEGATIVE
Glucose, UA: >=500 mg/dL — AB
Ketones, ur: NEGATIVE mg/dL
Leukocytes,Ua: NEGATIVE
Nitrite: NEGATIVE
Protein, ur: 100 mg/dL — AB
Specific Gravity, Urine: 1.01 (ref 1.005–1.030)
pH: 5 (ref 5.0–8.0)

## 2023-09-04 LAB — TROPONIN I (HIGH SENSITIVITY)
Troponin I (High Sensitivity): 11 ng/L (ref <18)
Troponin I (High Sensitivity): 11 ng/L (ref <18)

## 2023-09-04 LAB — LIPASE, BLOOD: Lipase: 48 U/L (ref 11–51)

## 2023-09-04 LAB — RESP PANEL BY RT-PCR (RSV, FLU A&B, COVID)  RVPGX2
Influenza A by PCR: NEGATIVE
Influenza B by PCR: NEGATIVE
Resp Syncytial Virus by PCR: NEGATIVE
SARS Coronavirus 2 by RT PCR: NEGATIVE

## 2023-09-04 MED ORDER — IOHEXOL 300 MG/ML  SOLN
100.0000 mL | Freq: Once | INTRAMUSCULAR | Status: AC | PRN
  Administered 2023-09-04: 100 mL via INTRAVENOUS

## 2023-09-04 MED ORDER — OMEPRAZOLE 20 MG PO CPDR: 20.0000 mg | DELAYED_RELEASE_CAPSULE | Freq: Every day | ORAL | 0 refills | Status: AC

## 2023-09-04 MED ORDER — SODIUM CHLORIDE 0.9 % IV BOLUS
500.0000 mL | Freq: Once | INTRAVENOUS | Status: AC
Start: 2023-09-04 — End: 2023-09-04
  Administered 2023-09-04: 500 mL via INTRAVENOUS

## 2023-09-04 MED ORDER — FAMOTIDINE IN NACL 20-0.9 MG/50ML-% IV SOLN
20.0000 mg | Freq: Once | INTRAVENOUS | Status: AC
  Administered 2023-09-04: 20 mg via INTRAVENOUS
  Filled 2023-09-04: qty 50

## 2023-09-04 MED ORDER — ONDANSETRON HCL 4 MG/2ML IJ SOLN
4.0000 mg | Freq: Once | INTRAMUSCULAR | Status: AC
  Administered 2023-09-04: 4 mg via INTRAVENOUS
  Filled 2023-09-04: qty 2

## 2023-09-04 NOTE — ED Triage Notes (Signed)
Pt is a poor historian and says she has had a stomach problem for the past 2 years that has gotten worse over the past couple days but can not elaborate about what the problem is.

## 2023-09-04 NOTE — ED Provider Notes (Signed)
[facility] AT [facility] Provider Note   CSN: [id number] Arrival date & time: 09/04/23  1522     History  Chief Complaint  Patient presents with   Abdominal Pain    CHE BELOW is a 78 y.o. female.  Pt is a 78 yo female with pmhx significant for HTN, CAD, DM, GERD, depression, CHF, COPD, and arthritis.  Pt is a poor historian.  She complains of upper abd pain for awhile, but it's gotten worse for the last few days.  She said she gets pain when she eats.  She has also had a runny nose and cough.  No known fevers.  She's not had constipation or diarrhea.  Some nausea.  She has been seen by GI (Dr. Myrtie Neither) for chronic abd pain and has had colonoscopy and endoscopy in 2022.       Home Medications Prior to Admission medications   Medication Sig Start Date End Date Taking? Authorizing Provider  acetaminophen (TYLENOL) 325 MG tablet Take 1 tablet (325 mg total) by mouth every 6 (six) hours as needed for mild pain (or temp > 100). Patient not taking: Reported on 06/22/2021 10/18/20   Juliet Rude, PA-C  amLODipine (NORVASC) 10 MG tablet Take 10 mg by mouth daily with lunch. 10/06/19   [provider]  aspirin 81 MG chewable tablet Chew 1 tablet (81 mg total) by mouth daily. Patient not taking: No sig reported 11/07/19   Laverda Page B, NP  atenolol (TENORMIN) 50 MG tablet Take 75 mg by mouth daily with lunch.    [provider]  bisacodyl (DULCOLAX) 5 MG EC tablet Take 5 mg by mouth daily as needed for moderate constipation.    [provider]  cholecalciferol (VITAMIN D3) 25 MCG (1000 UNIT) tablet Take 1,000 Units by mouth daily.    [provider]  cloNIDine (CATAPRES) 0.2 MG tablet Take 0.2 mg by mouth daily.    [provider]  ezetimibe (ZETIA) 10 MG tablet Take 1 tablet (10 mg total) by mouth daily. 11/07/19   Arty Baumgartner, NP  furosemide (LASIX) 40 MG tablet Take 40 mg by mouth daily.     [provider]  gemfibrozil (LOPID) 600 MG tablet Take 600 mg by mouth daily.  07/23/14   [provider]  HYDROcodone-acetaminophen (NORCO) 10-325 MG tablet Take 1 tablet by mouth every 8 (eight) hours as needed for pain. 10/04/20   [provider]  isosorbide mononitrate (IMDUR) 30 MG 24 hr tablet Take 1 tablet (30 mg total) by mouth daily. 11/07/19   Arty Baumgartner, NP  lisinopril (ZESTRIL) 10 MG tablet Take 10 mg by mouth daily. 10/29/19   [provider]  nitroGLYCERIN (NITROSTAT) 0.4 MG SL tablet Place 1 tablet (0.4 mg total) under the tongue every 5 (five) minutes as needed for chest pain. 09/29/20 06/22/21  Marcelino Duster, PA  omeprazole (PRILOSEC) 20 MG capsule Take 1 capsule (20 mg total) by mouth daily. 09/04/23   Jacalyn Lefevre, MD  PARoxetine (PAXIL) 40 MG tablet Take 40 mg by mouth every morning. 07/01/14   [provider]  Semaglutide (RYBELSUS) 7 MG TABS Take 7 mg by mouth daily.    [provider]  zolpidem (AMBIEN) 10 MG tablet Take 5-10 mg by mouth at bedtime. 07/11/14   [provider]      Allergies    Aspirin    Review of Systems   Review of Systems  HENT:  Positive for rhinorrhea.   Respiratory:  Positive for cough.   Gastrointestinal:  Positive for abdominal pain.  All other systems reviewed and are negative.   Physical Exam Updated Vital Signs BP (!) 174/69   Pulse 74   Temp 99 F (37.2 C) (Oral)   Resp 18   Ht 5\' 3"  (1.6 m)   Wt 62.6 kg   SpO2 97%   BMI 24.45 kg/m  Physical Exam Vitals and nursing note reviewed.  Constitutional:      Appearance: She is well-developed.  HENT:     Head: Normocephalic and atraumatic.     Mouth/Throat:     Mouth: Mucous membranes are moist.     Pharynx: Oropharynx is clear.  Eyes:     Extraocular Movements: Extraocular movements intact.     Pupils: Pupils are equal, round, and reactive to light.  Cardiovascular:     Rate and Rhythm: Normal rate  and regular rhythm.     Heart sounds: Normal heart sounds.  Pulmonary:     Effort: Pulmonary effort is normal.     Breath sounds: Normal breath sounds.  Abdominal:     General: Abdomen is flat. Bowel sounds are normal.     Palpations: Abdomen is soft.     Tenderness: There is abdominal tenderness in the epigastric area.  Skin:    General: Skin is warm.     Capillary Refill: Capillary refill takes less than 2 seconds.  Neurological:     General: No focal deficit present.     Mental Status: She is alert and oriented to person, place, and time.  Psychiatric:        Mood and Affect: Mood normal.        Behavior: Behavior normal.     ED Results / Procedures / Treatments   Labs (all labs ordered are listed, but only abnormal results are displayed) Labs Reviewed  CBC WITH DIFFERENTIAL/PLATELET - Abnormal; Notable for the following components:      Result Value   Hemoglobin 16.1 (*)    All other components within normal limits  COMPREHENSIVE METABOLIC PANEL - Abnormal; Notable for the following components:   Glucose, Bld 328 (*)    Calcium 10.4 (*)    Total Protein 8.5 (*)    AST 43 (*)    ALT 48 (*)    Total Bilirubin 1.6 (*)    All other components within normal limits  URINALYSIS, ROUTINE W REFLEX MICROSCOPIC - Abnormal; Notable for the following components:   Glucose, UA >=500 (*)    Hgb urine dipstick SMALL (*)    Protein, ur 100 (*)    All other components within normal limits  RESP PANEL BY RT-PCR (RSV, FLU A&B, COVID)  [id number]  LIPASE, BLOOD  TROPONIN I (HIGH SENSITIVITY)  TROPONIN I (HIGH SENSITIVITY)    EKG EKG Interpretation Date/Time:  Sunday September 04 2023 17:10:29 EST Ventricular Rate:  70 PR Interval:  174 QRS Duration:  147 QT Interval:  460 QTC Calculation: 497 R Axis:   -8  Text Interpretation: Sinus rhythm Left bundle branch block Confirmed by Jacalyn Lefevre [phone]) on 09/04/2023 5:20:17 PM  Radiology DG Chest 2 View Result Date:  09/04/2023 CLINICAL DATA:  Cough. EXAM: CHEST - 2 VIEW COMPARISON:  11/02/2019. FINDINGS: Low lung volume. There are increased interstitial markings throughout bilateral lungs, which is nonspecific. There is predominantly bilateral lateral reticulonodular opacity, concerning for underlying interstitial lung disease. Bilateral lung fields are otherwise clear. No acute consolidation or lung  collapse. Bilateral costophrenic angles are clear. Stable cardio-mediastinal silhouette. There are surgical staples along the heart border and sternotomy wires, status post CABG (coronary artery bypass graft). No acute osseous abnormalities. The soft tissues are within normal limits. IMPRESSION: *No active cardiopulmonary disease. Findings favor chronic interstitial lung disease. Electronically Signed   By: Jules Schick M.D.   On: 09/04/2023 17:53   CT ABDOMEN PELVIS W CONTRAST Result Date: 09/04/2023 CLINICAL DATA:  Two day history of worsening chronic abdominal pain EXAM: CT ABDOMEN AND PELVIS WITH CONTRAST TECHNIQUE: Multidetector CT imaging of the abdomen and pelvis was performed using the standard protocol following bolus administration of intravenous contrast. RADIATION DOSE REDUCTION: This exam was performed according to the departmental dose-optimization program which includes automated exposure control, adjustment of the mA and/or kV according to patient size and/or use of iterative reconstruction technique. CONTRAST:  OMNIPAQUE IOHEXOL 300 MG/ML  SOLN COMPARISON:  CT abdomen and pelvis dated 10/15/2020 FINDINGS: Lower chest: No focal consolidation or pulmonary nodule in the lung bases. No pleural effusion or pneumothorax demonstrated. Partially imaged multichamber cardiomegaly. Coronary artery calcifications. Hepatobiliary: No focal hepatic lesions. No intra or extrahepatic biliary ductal dilation. Cholecystectomy. Pancreas: No focal lesions or main ductal dilation. Spleen: Normal in size without focal  abnormality. Adrenals/Urinary Tract: No adrenal nodules. No suspicious renal mass, calculi or hydronephrosis. No focal bladder wall thickening. Stomach/Bowel: Focal rounded enhancing focus arising from the left lateral distal esophagus immediately proximal to the gastroesophageal junction (2:14). Normal appearance of the stomach. No evidence of bowel wall thickening, distention, or inflammatory changes. Appendectomy. Vascular/Lymphatic: Aortic atherosclerosis. No enlarged abdominal or pelvic lymph nodes. Reproductive: No adnexal masses. Other: No free fluid, fluid collection, or free air. Musculoskeletal: No acute or abnormal lytic or blastic osseous lesions. Partially imaged median sternotomy wires are nondisplaced. Postsurgical changes of the anterior abdominal wall. IMPRESSION: 1. No acute abdominopelvic findings. 2. Focal rounded enhancing focus arising from the left lateral distal esophagus immediately proximal to the gastroesophageal junction, which may represent a small polyp. Recommend further evaluation with endoscopy. 3. Partially imaged multichamber cardiomegaly. 4. Aortic Atherosclerosis (ICD10-I70.0). Coronary artery calcifications. Assessment for potential risk factor modification, dietary therapy or pharmacologic therapy may be warranted, if clinically indicated. Electronically Signed   By: Agustin Cree M.D.   On: 09/04/2023 17:49    Procedures Procedures    Medications Ordered in ED Medications  sodium chloride 0.9 % bolus 500 mL (0 mLs Intravenous Stopped 09/04/23 1837)  ondansetron (ZOFRAN) injection 4 mg (4 mg Intravenous Given 09/04/23 1622)  famotidine (PEPCID) IVPB 20 mg premix (0 mg Intravenous Stopped 09/04/23 1652)  iohexol (OMNIPAQUE) 300 MG/ML solution 100 mL (100 mLs Intravenous Contrast Given 09/04/23 1715)    ED Course/ Medical Decision Making/ A&P                                 Medical Decision Making Amount and/or Complexity of Data Reviewed Labs: ordered. Radiology:  ordered.  Risk Prescription drug management.   This patient presents to the ED for concern of abd pain, this involves an extensive number of treatment options, and is a complaint that carries with it a high risk of complications and morbidity.  The differential diagnosis includes gerd, gastritis, pancreatitis, sbo, uti, cardiac, covid/flu/rsv   Co morbidities that complicate the patient evaluation  HTN, CAD, DM, GERD, depression, CHF, COPD, and arthritis   Additional history obtained:  Additional history obtained from epic  chart review  Lab Tests:  I Ordered, and personally interpreted labs.  The pertinent results include:  ua + small hgb, protein; cbc nl, cmp nl other than bs elevated at 328; LFTS sl elevated at 43 and 48 (AST and ALT) and TB sl elevated at 1.6; trop nl; covid/flu/rsv neg   Imaging Studies ordered:  I ordered imaging studies including ct abd/pelvis and CXR  I independently visualized and interpreted imaging which showed  CXR: *No active cardiopulmonary disease. Findings favor chronic  interstitial lung disease.  CT abd/pelvis: No acute abdominopelvic findings.  2. Focal rounded enhancing focus arising from the left lateral  distal esophagus immediately proximal to the gastroesophageal  junction, which may represent a small polyp. Recommend further  evaluation with endoscopy.  3. Partially imaged multichamber cardiomegaly.  4. Aortic Atherosclerosis (ICD10-I70.0). Coronary artery  calcifications. Assessment for potential risk factor modification,  dietary therapy or pharmacologic therapy may be warranted, if  clinically indicated.   I agree with the radiologist interpretation   Cardiac Monitoring:  The patient was maintained on a cardiac monitor.  I personally viewed and interpreted the cardiac monitored which showed an underlying rhythm of: nsr   Medicines ordered and prescription drug management:  I ordered medication including pepcid/zofran  for  sx  Reevaluation of the patient after these medicines showed that the patient improved I have reviewed the patients home medicines and have made adjustments as needed   Test Considered:  ct   Critical Interventions:  Fluids/meds   Problem List / ED Course:  Abd pain:  pt is feeling better.  She does not think she's taking the protonix, so she's given a new rx.  She is stable for d/c.  She needs to f/u with gi.  Return if worse. URI:  neg for covid/flu/rsv.  CXR clear.  She is saturating well.  Likely viral URI.    Reevaluation:  After the interventions noted above, I reevaluated the patient and found that they have :improved   Social Determinants of Health:  Lives at home   Dispostion:  After consideration of the diagnostic results and the patients response to treatment, I feel that the patent would benefit from discharge with outpatient f/u.          Final Clinical Impression(s) / ED Diagnoses Final diagnoses:  Generalized abdominal pain  Viral upper respiratory tract infection    Rx / DC Orders ED Discharge Orders          Ordered    omeprazole (PRILOSEC) 20 MG capsule  Daily        09/04/23 1853              Jacalyn Lefevre, MD 09/04/23 [phone]

## 2023-09-09 DIAGNOSIS — J3489 Other specified disorders of nose and nasal sinuses: Secondary | ICD-10-CM | POA: Diagnosis not present

## 2023-09-09 DIAGNOSIS — R1084 Generalized abdominal pain: Secondary | ICD-10-CM | POA: Diagnosis not present

## 2023-09-09 DIAGNOSIS — Z20822 Contact with and (suspected) exposure to covid-19: Secondary | ICD-10-CM | POA: Diagnosis not present

## 2023-09-09 DIAGNOSIS — I10 Essential (primary) hypertension: Secondary | ICD-10-CM | POA: Diagnosis not present

## 2023-09-09 DIAGNOSIS — I252 Old myocardial infarction: Secondary | ICD-10-CM | POA: Diagnosis not present

## 2023-09-09 DIAGNOSIS — E119 Type 2 diabetes mellitus without complications: Secondary | ICD-10-CM | POA: Diagnosis not present

## 2023-09-09 DIAGNOSIS — Z1152 Encounter for screening for COVID-19: Secondary | ICD-10-CM | POA: Diagnosis not present

## 2023-09-15 DIAGNOSIS — K719 Toxic liver disease, unspecified: Secondary | ICD-10-CM | POA: Diagnosis not present

## 2023-09-15 DIAGNOSIS — E7849 Other hyperlipidemia: Secondary | ICD-10-CM | POA: Diagnosis not present

## 2023-09-15 DIAGNOSIS — K760 Fatty (change of) liver, not elsewhere classified: Secondary | ICD-10-CM | POA: Diagnosis not present

## 2023-09-15 DIAGNOSIS — E1165 Type 2 diabetes mellitus with hyperglycemia: Secondary | ICD-10-CM | POA: Diagnosis not present

## 2023-09-22 DIAGNOSIS — E782 Mixed hyperlipidemia: Secondary | ICD-10-CM | POA: Diagnosis not present

## 2023-09-22 DIAGNOSIS — Z951 Presence of aortocoronary bypass graft: Secondary | ICD-10-CM | POA: Diagnosis not present

## 2023-09-22 DIAGNOSIS — I10 Essential (primary) hypertension: Secondary | ICD-10-CM | POA: Diagnosis not present

## 2023-09-22 DIAGNOSIS — E1169 Type 2 diabetes mellitus with other specified complication: Secondary | ICD-10-CM | POA: Diagnosis not present

## 2023-12-14 DIAGNOSIS — K760 Fatty (change of) liver, not elsewhere classified: Secondary | ICD-10-CM | POA: Diagnosis not present

## 2023-12-14 DIAGNOSIS — I10 Essential (primary) hypertension: Secondary | ICD-10-CM | POA: Diagnosis not present

## 2023-12-14 DIAGNOSIS — Z1329 Encounter for screening for other suspected endocrine disorder: Secondary | ICD-10-CM | POA: Diagnosis not present

## 2023-12-14 DIAGNOSIS — N183 Chronic kidney disease, stage 3 unspecified: Secondary | ICD-10-CM | POA: Diagnosis not present

## 2023-12-14 DIAGNOSIS — E782 Mixed hyperlipidemia: Secondary | ICD-10-CM | POA: Diagnosis not present

## 2023-12-14 DIAGNOSIS — Z131 Encounter for screening for diabetes mellitus: Secondary | ICD-10-CM | POA: Diagnosis not present

## 2023-12-14 DIAGNOSIS — Z79891 Long term (current) use of opiate analgesic: Secondary | ICD-10-CM | POA: Diagnosis not present

## 2023-12-21 DIAGNOSIS — E1169 Type 2 diabetes mellitus with other specified complication: Secondary | ICD-10-CM | POA: Diagnosis not present

## 2023-12-21 DIAGNOSIS — E782 Mixed hyperlipidemia: Secondary | ICD-10-CM | POA: Diagnosis not present

## 2023-12-21 DIAGNOSIS — Z951 Presence of aortocoronary bypass graft: Secondary | ICD-10-CM | POA: Diagnosis not present

## 2023-12-21 DIAGNOSIS — I10 Essential (primary) hypertension: Secondary | ICD-10-CM | POA: Diagnosis not present
